# Patient Record
Sex: Female | Born: 1965
Health system: Southern US, Community
[De-identification: ages and names within clinical notes are randomized; demographics above are authoritative.]

## PROBLEM LIST (undated history)

## (undated) DIAGNOSIS — G4730 Sleep apnea, unspecified: Secondary | ICD-10-CM

## (undated) DIAGNOSIS — E079 Disorder of thyroid, unspecified: Secondary | ICD-10-CM

## (undated) DIAGNOSIS — M2550 Pain in unspecified joint: Secondary | ICD-10-CM

## (undated) DIAGNOSIS — R7989 Other specified abnormal findings of blood chemistry: Secondary | ICD-10-CM

## (undated) DIAGNOSIS — J40 Bronchitis, not specified as acute or chronic: Secondary | ICD-10-CM

## (undated) DIAGNOSIS — Z9889 Other specified postprocedural states: Secondary | ICD-10-CM

## (undated) DIAGNOSIS — E7800 Pure hypercholesterolemia, unspecified: Secondary | ICD-10-CM

## (undated) DIAGNOSIS — D219 Benign neoplasm of connective and other soft tissue, unspecified: Secondary | ICD-10-CM

## (undated) DIAGNOSIS — R112 Nausea with vomiting, unspecified: Secondary | ICD-10-CM

## (undated) DIAGNOSIS — C801 Malignant (primary) neoplasm, unspecified: Secondary | ICD-10-CM

## (undated) DIAGNOSIS — E039 Hypothyroidism, unspecified: Secondary | ICD-10-CM

## (undated) DIAGNOSIS — E611 Iron deficiency: Secondary | ICD-10-CM

## (undated) DIAGNOSIS — F419 Anxiety disorder, unspecified: Secondary | ICD-10-CM

## (undated) DIAGNOSIS — M1990 Unspecified osteoarthritis, unspecified site: Secondary | ICD-10-CM

## (undated) DIAGNOSIS — E539 Vitamin B deficiency, unspecified: Secondary | ICD-10-CM

## (undated) HISTORY — DX: Anxiety disorder, unspecified: F41.9

## (undated) HISTORY — DX: Pain in unspecified joint: M25.50

## (undated) HISTORY — DX: Iron deficiency: E61.1

## (undated) HISTORY — PX: COLONOSCOPY: SHX174

## (undated) HISTORY — DX: Bronchitis, not specified as acute or chronic: J40

## (undated) HISTORY — PX: ABDOMINAL HYSTERECTOMY: SHX81

## (undated) HISTORY — DX: Vitamin B deficiency, unspecified: E53.9

## (undated) HISTORY — DX: Unspecified osteoarthritis, unspecified site: M19.90

## (undated) HISTORY — DX: Pure hypercholesterolemia, unspecified: E78.00

## (undated) HISTORY — PX: MOHS SURGERY: SUR867

## (undated) HISTORY — DX: Disorder of thyroid, unspecified: E07.9

## (undated) HISTORY — PX: WISDOM TOOTH EXTRACTION: SHX21

## (undated) HISTORY — DX: Other specified abnormal findings of blood chemistry: R79.89

---

## 2005-11-08 ENCOUNTER — Emergency Department (HOSPITAL_COMMUNITY): Admission: EM | Admit: 2005-11-08 | Discharge: 2005-11-08 | Payer: Self-pay | Admitting: Emergency Medicine

## 2006-01-28 ENCOUNTER — Encounter: Admission: RE | Admit: 2006-01-28 | Discharge: 2006-01-28 | Payer: Self-pay | Admitting: Internal Medicine

## 2006-09-15 ENCOUNTER — Encounter: Admission: RE | Admit: 2006-09-15 | Discharge: 2006-09-15 | Payer: Self-pay | Admitting: Obstetrics & Gynecology

## 2007-09-26 ENCOUNTER — Encounter: Admission: RE | Admit: 2007-09-26 | Discharge: 2007-09-26 | Payer: Self-pay | Admitting: Obstetrics & Gynecology

## 2007-10-03 ENCOUNTER — Encounter: Admission: RE | Admit: 2007-10-03 | Discharge: 2007-10-03 | Payer: Self-pay | Admitting: Obstetrics & Gynecology

## 2008-02-09 ENCOUNTER — Ambulatory Visit: Payer: Self-pay | Admitting: Internal Medicine

## 2008-08-14 ENCOUNTER — Ambulatory Visit: Payer: Self-pay | Admitting: Internal Medicine

## 2008-10-09 ENCOUNTER — Encounter: Admission: RE | Admit: 2008-10-09 | Discharge: 2008-10-09 | Payer: Self-pay | Admitting: Obstetrics & Gynecology

## 2009-02-12 ENCOUNTER — Ambulatory Visit: Payer: Self-pay | Admitting: Internal Medicine

## 2009-08-22 ENCOUNTER — Ambulatory Visit: Payer: Self-pay | Admitting: Internal Medicine

## 2009-10-14 ENCOUNTER — Encounter: Admission: RE | Admit: 2009-10-14 | Discharge: 2009-10-14 | Payer: Self-pay | Admitting: Obstetrics & Gynecology

## 2010-02-27 ENCOUNTER — Ambulatory Visit: Payer: Self-pay | Admitting: Internal Medicine

## 2010-03-07 ENCOUNTER — Encounter: Admission: RE | Admit: 2010-03-07 | Discharge: 2010-03-07 | Payer: Self-pay | Admitting: Internal Medicine

## 2010-06-05 ENCOUNTER — Other Ambulatory Visit: Payer: Self-pay | Admitting: Internal Medicine

## 2010-06-06 ENCOUNTER — Ambulatory Visit: Payer: Self-pay | Admitting: Internal Medicine

## 2010-06-30 ENCOUNTER — Other Ambulatory Visit: Payer: Federal, State, Local not specified - PPO | Admitting: Internal Medicine

## 2010-07-08 ENCOUNTER — Ambulatory Visit (INDEPENDENT_AMBULATORY_CARE_PROVIDER_SITE_OTHER): Payer: Federal, State, Local not specified - PPO | Admitting: Internal Medicine

## 2010-07-08 DIAGNOSIS — E785 Hyperlipidemia, unspecified: Secondary | ICD-10-CM

## 2010-08-04 ENCOUNTER — Encounter: Payer: Self-pay | Admitting: Internal Medicine

## 2010-08-28 ENCOUNTER — Ambulatory Visit: Payer: Federal, State, Local not specified - PPO | Admitting: Internal Medicine

## 2010-09-20 ENCOUNTER — Other Ambulatory Visit: Payer: Self-pay | Admitting: Internal Medicine

## 2010-10-07 ENCOUNTER — Other Ambulatory Visit: Payer: Federal, State, Local not specified - PPO | Admitting: Internal Medicine

## 2010-10-07 ENCOUNTER — Telehealth: Payer: Self-pay | Admitting: *Deleted

## 2010-10-07 DIAGNOSIS — E039 Hypothyroidism, unspecified: Secondary | ICD-10-CM

## 2010-10-07 DIAGNOSIS — E785 Hyperlipidemia, unspecified: Secondary | ICD-10-CM

## 2010-10-07 LAB — LIPID PANEL
HDL: 51 mg/dL (ref >39)
LDL Cholesterol: 69 mg/dL (ref 0–99)
Total CHOL/HDL Ratio: 2.6 Ratio
Triglycerides: 68 mg/dL (ref <150)
VLDL: 14 mg/dL (ref 0–40)

## 2010-10-07 LAB — HEPATIC FUNCTION PANEL
Albumin: 4.4 g/dL (ref 3.5–5.2)
Alkaline Phosphatase: 63 U/L (ref 39–117)
Total Bilirubin: 0.6 mg/dL (ref 0.3–1.2)
Total Protein: 7 g/dL (ref 6.0–8.3)

## 2010-10-07 LAB — HEMOGLOBIN A1C
Hgb A1c MFr Bld: 5.3 % (ref <5.7)
Mean Plasma Glucose: 105 mg/dL (ref <117)

## 2010-10-07 LAB — TSH: TSH: 2.218 u[IU]/mL (ref 0.350–4.500)

## 2010-10-07 NOTE — Progress Notes (Signed)
Addended by: Daisy Blossom on: 10/07/2010 09:23 AM   Modules accepted: Orders

## 2010-10-07 NOTE — Progress Notes (Signed)
Addended by: Daisy Blossom on: 10/07/2010 11:01 AM   Modules accepted: Orders

## 2010-10-07 NOTE — Telephone Encounter (Signed)
Order free T4, TSH and AIC.

## 2010-10-08 LAB — FERRITIN: Ferritin: 63 ng/mL (ref 10–291)

## 2010-10-09 ENCOUNTER — Encounter: Payer: Self-pay | Admitting: Internal Medicine

## 2010-10-09 ENCOUNTER — Ambulatory Visit (INDEPENDENT_AMBULATORY_CARE_PROVIDER_SITE_OTHER): Payer: Federal, State, Local not specified - PPO | Admitting: Internal Medicine

## 2010-10-09 DIAGNOSIS — E039 Hypothyroidism, unspecified: Secondary | ICD-10-CM

## 2010-10-09 DIAGNOSIS — F411 Generalized anxiety disorder: Secondary | ICD-10-CM

## 2010-10-09 DIAGNOSIS — F419 Anxiety disorder, unspecified: Secondary | ICD-10-CM | POA: Insufficient documentation

## 2010-10-09 DIAGNOSIS — G43909 Migraine, unspecified, not intractable, without status migrainosus: Secondary | ICD-10-CM

## 2010-10-09 DIAGNOSIS — E785 Hyperlipidemia, unspecified: Secondary | ICD-10-CM

## 2010-10-09 NOTE — Patient Instructions (Signed)
Continue nutritional counseling. Exercise and weight loss. See in 6 months for PE

## 2010-10-09 NOTE — Progress Notes (Signed)
  Subjective:    Patient ID: Tricia Miranda, female    DOB: 05-18-1965, 45 y.o.   MRN: [id number]  HPI in today to followup on hypothyroidism and hyperlipidemia. Has been working with nutritionist. Nutritionist wanted diabetes ruled out and hemoglobin A1c proved to be within normal limits. Patient says she's lost about 5 or 10 panels since last visit. Last visit March 2000 coil she weight 207 1/2 pounds so indeed she's lost 11 1/2 pounds. This is good progress. She will to come off of Crestor if she loses sufficient amount of weight. Told her she would need to lose 20-25 pounds before we would consider that. For now continue with signed dose of Synthroid and Crestor 5 mg daily.    Review of Systems     Objective:   Physical Exam chest clear cardiac exam regular rate and rhythm thyroid without enlargement        Assessment & Plan:  Hypothyroidism-stable on current dose of Synthroid  Hyperlipidemia-stable on Crestor 5 mg daily  Anxiety stable on current anti-anxiety medication  Obesity successful weight loss with nutritional intervention  Migraine-no increase in number of migraines  Plan return in 6 months for physical examination and fasting lab work

## 2010-10-10 ENCOUNTER — Other Ambulatory Visit: Payer: Self-pay | Admitting: Obstetrics & Gynecology

## 2010-10-10 DIAGNOSIS — Z1231 Encounter for screening mammogram for malignant neoplasm of breast: Secondary | ICD-10-CM

## 2010-10-29 ENCOUNTER — Ambulatory Visit
Admission: RE | Admit: 2010-10-29 | Discharge: 2010-10-29 | Disposition: A | Discharge status: Home / Self Care | Payer: Federal, State, Local not specified - PPO | Source: Ambulatory Visit | Attending: Obstetrics & Gynecology | Admitting: Obstetrics & Gynecology

## 2010-10-29 DIAGNOSIS — Z1231 Encounter for screening mammogram for malignant neoplasm of breast: Secondary | ICD-10-CM

## 2010-11-26 ENCOUNTER — Other Ambulatory Visit: Payer: Self-pay

## 2010-11-26 MED ORDER — ROSUVASTATIN CALCIUM 5 MG PO TABS: 5.0000 mg | ORAL_TABLET | Freq: Every day | ORAL | Status: DC

## 2010-11-27 ENCOUNTER — Telehealth: Payer: Self-pay | Admitting: Internal Medicine

## 2010-11-27 NOTE — Telephone Encounter (Signed)
Left message for patient to call and schedule CPE w/no pap after 02/28/11.

## 2011-02-26 ENCOUNTER — Other Ambulatory Visit: Payer: Self-pay | Admitting: Internal Medicine

## 2011-02-26 MED ORDER — ALPRAZOLAM 0.25 MG PO TABS: 0.2500 mg | ORAL_TABLET | Freq: Two times a day (BID) | ORAL | Status: DC | PRN

## 2011-03-10 ENCOUNTER — Encounter: Payer: Self-pay | Admitting: Internal Medicine

## 2011-03-10 ENCOUNTER — Ambulatory Visit (INDEPENDENT_AMBULATORY_CARE_PROVIDER_SITE_OTHER): Payer: Federal, State, Local not specified - PPO | Admitting: Internal Medicine

## 2011-03-10 VITALS — BP 120/72 | HR 76 | Temp 98.6°F | Wt 197.0 lb

## 2011-03-10 DIAGNOSIS — M546 Pain in thoracic spine: Secondary | ICD-10-CM

## 2011-03-10 NOTE — Patient Instructions (Signed)
Take ibuprofen 400 mg 3 times daily for 10 days to 2 weeks. Ice back down for 20 minutes daily. Take Flexeril 1/2-1 tablet at bedtime. Call if not better in 2 weeks and we will arrange physical therapy. Do not think x-ray is indicated at this point in time.

## 2011-03-10 NOTE — Progress Notes (Signed)
  Subjective:    Patient ID: Tricia Miranda, female    DOB: [birth year], 45 y.o.   MRN: [id number]  HPI Patient recalls caring a pan of brownies a block and a half to another home program October 31. After that she began to experience some right parathoracic back pain. Recently has been swimming using kick board with her arms extended. Has felt that activity was aggravating the parathoracic back pain as well. Has had some nausea with this pain. Tylenol does not help the pain. She is concerned because the pain has gone on  for at least a couple of weeks. No neck pain and no lower back pain. No fever or chills. No vomiting. Just occasional nausea. No headache.    Review of Systems     Objective:   Physical Exam could not specifically identify any trigger points  in parathoracic muscle areas. No pain to palpation of the thoracic spine. Deep tendon reflexes 2+ and symmetrical. Patient indicates area of pain is mid right parathoracic spine area. She indicated during the exam that when she dorsiflexed her neck she felt pain in that area. Muscle strength in the upper 70s is within normal limits.        Assessment & Plan:   musculoskeletal parathoracic back pain.  Plan: Patient was given prescription for Flexeril 10 mg per and sees #30) 1/2-1 by mouth each bedtime. Take ibuprofen 400 mg by mouth 3 times a day with food for 10 days to 2 weeks. Ice back down for 20 minutes daily. Avoid swimming using arms extended with kick board. She has been swimming about 30 minutes daily during lunch.

## 2011-03-19 ENCOUNTER — Other Ambulatory Visit: Payer: Federal, State, Local not specified - PPO | Admitting: Internal Medicine

## 2011-03-19 DIAGNOSIS — E785 Hyperlipidemia, unspecified: Secondary | ICD-10-CM

## 2011-03-19 DIAGNOSIS — E039 Hypothyroidism, unspecified: Secondary | ICD-10-CM

## 2011-03-19 DIAGNOSIS — Z0000 Encounter for general adult medical examination without abnormal findings: Secondary | ICD-10-CM

## 2011-03-20 LAB — COMPREHENSIVE METABOLIC PANEL
ALT: 14 U/L (ref 0–35)
Alkaline Phosphatase: 65 U/L (ref 39–117)
CO2: 25 mEq/L (ref 19–32)
Creat: 0.66 mg/dL (ref 0.50–1.10)
Glucose, Bld: 96 mg/dL (ref 70–99)
Sodium: 138 mEq/L (ref 135–145)
Total Bilirubin: 0.5 mg/dL (ref 0.3–1.2)
Total Protein: 6.6 g/dL (ref 6.0–8.3)

## 2011-03-20 LAB — CBC WITH DIFFERENTIAL/PLATELET
Basophils Relative: 1 % (ref 0–1)
Eosinophils Absolute: 0.1 10*3/uL (ref 0.0–0.7)
Eosinophils Relative: 2 % (ref 0–5)
Lymphs Abs: 2.2 10*3/uL (ref 0.7–4.0)
MCH: 29.3 pg (ref 26.0–34.0)
MCHC: 32.7 g/dL (ref 30.0–36.0)
MCV: 89.6 fL (ref 78.0–100.0)
Neutrophils Relative %: 53 % (ref 43–77)
Platelets: 280 10*3/uL (ref 150–400)
RBC: 4.51 MIL/uL (ref 3.87–5.11)
RDW: 13 % (ref 11.5–15.5)

## 2011-03-20 LAB — LIPID PANEL
Cholesterol: 152 mg/dL (ref 0–200)
LDL Cholesterol: 82 mg/dL (ref 0–99)
Total CHOL/HDL Ratio: 2.9 Ratio
Triglycerides: 84 mg/dL (ref <150)
VLDL: 17 mg/dL (ref 0–40)

## 2011-03-23 ENCOUNTER — Ambulatory Visit (INDEPENDENT_AMBULATORY_CARE_PROVIDER_SITE_OTHER): Payer: Federal, State, Local not specified - PPO | Admitting: Internal Medicine

## 2011-03-23 ENCOUNTER — Encounter: Payer: Self-pay | Admitting: Internal Medicine

## 2011-03-23 VITALS — BP 106/66 | HR 76 | Temp 99.1°F | Ht 67.5 in | Wt 197.0 lb

## 2011-03-23 DIAGNOSIS — IMO0001 Reserved for inherently not codable concepts without codable children: Secondary | ICD-10-CM

## 2011-03-23 DIAGNOSIS — M7918 Myalgia, other site: Secondary | ICD-10-CM

## 2011-03-23 DIAGNOSIS — Z0000 Encounter for general adult medical examination without abnormal findings: Secondary | ICD-10-CM

## 2011-03-23 DIAGNOSIS — F419 Anxiety disorder, unspecified: Secondary | ICD-10-CM

## 2011-03-23 DIAGNOSIS — E785 Hyperlipidemia, unspecified: Secondary | ICD-10-CM

## 2011-03-23 DIAGNOSIS — F411 Generalized anxiety disorder: Secondary | ICD-10-CM

## 2011-03-23 DIAGNOSIS — E039 Hypothyroidism, unspecified: Secondary | ICD-10-CM

## 2011-03-23 DIAGNOSIS — F4521 Hypochondriasis: Secondary | ICD-10-CM

## 2011-03-23 LAB — POCT URINALYSIS DIPSTICK
Bilirubin, UA: NEGATIVE
Ketones, UA: NEGATIVE
Protein, UA: NEGATIVE
pH, UA: 7

## 2011-04-20 NOTE — Patient Instructions (Signed)
Continue same medications and return in 6 months. Please do not worry excessively about minor fleeting symptoms.

## 2011-04-29 ENCOUNTER — Other Ambulatory Visit: Payer: Self-pay

## 2011-04-29 MED ORDER — ROSUVASTATIN CALCIUM 5 MG PO TABS: 5.0000 mg | ORAL_TABLET | Freq: Every day | ORAL | Status: DC

## 2011-04-30 ENCOUNTER — Other Ambulatory Visit: Payer: Federal, State, Local not specified - PPO | Admitting: Internal Medicine

## 2011-06-02 ENCOUNTER — Other Ambulatory Visit: Payer: Self-pay | Admitting: Internal Medicine

## 2011-06-02 DIAGNOSIS — Z8781 Personal history of (healed) traumatic fracture: Secondary | ICD-10-CM

## 2011-06-08 ENCOUNTER — Ambulatory Visit
Admission: RE | Admit: 2011-06-08 | Discharge: 2011-06-08 | Disposition: A | Discharge status: Home / Self Care | Payer: Federal, State, Local not specified - PPO | Source: Ambulatory Visit | Attending: Internal Medicine | Admitting: Internal Medicine

## 2011-06-08 DIAGNOSIS — Z8781 Personal history of (healed) traumatic fracture: Secondary | ICD-10-CM

## 2011-06-18 ENCOUNTER — Encounter: Payer: Self-pay | Admitting: Internal Medicine

## 2011-06-18 NOTE — Progress Notes (Signed)
Subjective:    Patient ID: Tricia Miranda, female    DOB: January 29, 1966, 46 y.o.   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 History of hyperlipidemia, hypothyroidism, anxiety, fibrocystic breast disease, essential tremor, migraine headache, vitamin D deficiency, basal cell carcinoma left face 2009. Had right comminuted humerus fracture October 2007. Left fibula fracture March 2009. Last mammogram June 2011. Tetanus immunization 2007.  Patient is on Xanax twice daily when necessary, generic Prozac daily, Crestor. Thought she may have had some myalgias from Zocor. Was started on Crestor March 2012. Recently has had some musculoskeletal back pain for which Flexeril was prescribed.  Family history father with history of alcoholism and heart disease. Mother in good health. Sr. treated for hip dysplasia.  Social history patient's husband as a Social worker  At [employer]. Patient works for [employer]. She and her husband moved here from [place] for him to take job in [employer] at [employer]. Patient does not smoke. Social alcohol consumption consisting of one or 2 drinks per day 3 times a week. Admits to being obsessive and anxious about her health. Has multiple questions about various aches and pains. Occasionally has some paresthesias. At one point she brought in a 3 page long outlining these in 2008. Apparently when she was living in [place] a few years ago she had a fine tremor and some falls while she was on a hiking trip. Western was raised about multiple sclerosis. She actually had 2 MRIs but only have one on file in 2002 with and without gadolinium showing a punctate focus of T2 hypersensitivity in the superior left frontal white matter described as nonspecific. Was also a tiny focus of enhancement in the immediate pons probably representing a capillary T. late dictation. She was evaluated by a  neurologist at [facility] and subsequently she had a psychologist who insisted it was important to obtain a diagnosis if indeed she did have multiple sclerosis so she went to see someone at the [facility] and had more testing. Somewhere along the way she had nerve conduction testing and thinks it may have revealed some "interesting" abnormality but I do not have a copy of that report. In any case, the question of multiple sclerosis I think remains in her mind at times.    Review of Systems  Constitutional: Positive for fatigue.  HENT: Negative.   Eyes: Negative.   Respiratory: Negative.   Cardiovascular: Negative.   Gastrointestinal: Negative.   Genitourinary: Negative.   Musculoskeletal:       Parascapular pain  Neurological:       Essential tremor  Hematological: Negative.   Psychiatric/Behavioral:       Anxiety       Objective:   Physical Exam  Vitals reviewed. Constitutional: She is oriented to person, place, and time. She appears well-developed and well-nourished. No distress.  HENT:  Head: Normocephalic and atraumatic.  Right Ear: External ear normal.  Left Ear: External ear normal.  Mouth/Throat: Oropharynx is clear and moist.  Eyes: Conjunctivae and EOM are normal. Pupils are equal, round, and reactive to light. Right eye exhibits no discharge. Left eye exhibits no discharge.  Neck: Neck supple. No JVD present. No thyromegaly present.  Cardiovascular: Normal rate, regular rhythm, normal heart sounds and intact distal pulses.   No murmur heard. Pulmonary/Chest: She has no wheezes. She has no rales.  Abdominal: Soft. Bowel sounds are  normal. She exhibits no mass. There is no tenderness. There is no rebound.  Genitourinary:       Deferred to GYN  Musculoskeletal: Normal range of motion.  Lymphadenopathy:    She has no cervical adenopathy.  Neurological: She is alert and oriented to person, place, and time. She has normal reflexes. She  displays normal reflexes. No cranial nerve deficit. Coordination normal.       Essential tremor  Skin: Skin is warm and dry. No rash noted. She is not diaphoretic.  Psychiatric: She has a normal mood and affect. Judgment normal.          Assessment & Plan:  Hypothyroidism  Anxiety  Essential tremor  History of migraine headaches  History of vitamin D deficiency  History of basal cell carcinoma left face  Musculoskeletal pain parascapular area  Remote history of paresthesias raising the question of possible multiple sclerosis but this was never proven but remains a cause for patient's anxiety.  Plan: Patient is to return in 6 months for TSH, fasting lipid panel liver functions and office visit. Okay to refill antianxiety medication for 6 months. Continue Prozac, Crestor, over-the-counter vitamin D supplementation and Synthroid

## 2011-06-29 ENCOUNTER — Other Ambulatory Visit: Payer: Self-pay | Admitting: Internal Medicine

## 2011-06-30 ENCOUNTER — Other Ambulatory Visit: Payer: Self-pay

## 2011-06-30 MED ORDER — SYNTHROID 75 MCG PO TABS: 75.0000 ug | ORAL_TABLET | Freq: Every day | ORAL | Status: DC

## 2011-09-21 ENCOUNTER — Other Ambulatory Visit: Payer: Self-pay | Admitting: Internal Medicine

## 2011-09-22 ENCOUNTER — Ambulatory Visit: Payer: Self-pay | Admitting: Internal Medicine

## 2011-09-22 ENCOUNTER — Other Ambulatory Visit: Payer: Federal, State, Local not specified - PPO | Admitting: Internal Medicine

## 2011-09-24 ENCOUNTER — Ambulatory Visit: Payer: Federal, State, Local not specified - PPO | Admitting: Internal Medicine

## 2011-10-19 ENCOUNTER — Other Ambulatory Visit: Payer: Self-pay | Admitting: Internal Medicine

## 2011-10-26 ENCOUNTER — Other Ambulatory Visit: Payer: Federal, State, Local not specified - PPO | Admitting: Internal Medicine

## 2011-10-26 DIAGNOSIS — E785 Hyperlipidemia, unspecified: Secondary | ICD-10-CM

## 2011-10-26 DIAGNOSIS — E039 Hypothyroidism, unspecified: Secondary | ICD-10-CM

## 2011-10-26 DIAGNOSIS — Z7901 Long term (current) use of anticoagulants: Secondary | ICD-10-CM

## 2011-10-26 LAB — LIPID PANEL
Cholesterol: 163 mg/dL (ref 0–200)
Total CHOL/HDL Ratio: 3.1 Ratio
Triglycerides: 128 mg/dL (ref <150)
VLDL: 26 mg/dL (ref 0–40)

## 2011-10-26 LAB — HEPATIC FUNCTION PANEL
ALT: 14 U/L (ref 0–35)
AST: 15 U/L (ref 0–37)
Bilirubin, Direct: 0.1 mg/dL (ref 0.0–0.3)
Total Protein: 6.7 g/dL (ref 6.0–8.3)

## 2011-10-26 LAB — TSH: TSH: 4.939 u[IU]/mL — ABNORMAL HIGH (ref 0.350–4.500)

## 2011-10-27 ENCOUNTER — Ambulatory Visit (INDEPENDENT_AMBULATORY_CARE_PROVIDER_SITE_OTHER): Payer: Federal, State, Local not specified - PPO | Admitting: Internal Medicine

## 2011-10-27 ENCOUNTER — Encounter: Payer: Self-pay | Admitting: Internal Medicine

## 2011-10-27 VITALS — BP 106/74 | HR 72 | Temp 98.8°F | Ht 67.25 in | Wt 203.0 lb

## 2011-10-27 DIAGNOSIS — E039 Hypothyroidism, unspecified: Secondary | ICD-10-CM

## 2011-10-27 DIAGNOSIS — Z8639 Personal history of other endocrine, nutritional and metabolic disease: Secondary | ICD-10-CM

## 2011-10-27 DIAGNOSIS — Z87898 Personal history of other specified conditions: Secondary | ICD-10-CM

## 2011-10-27 DIAGNOSIS — E785 Hyperlipidemia, unspecified: Secondary | ICD-10-CM

## 2011-10-27 DIAGNOSIS — F411 Generalized anxiety disorder: Secondary | ICD-10-CM

## 2011-10-27 DIAGNOSIS — Z8669 Personal history of other diseases of the nervous system and sense organs: Secondary | ICD-10-CM

## 2011-10-27 DIAGNOSIS — G250 Essential tremor: Secondary | ICD-10-CM

## 2011-10-27 DIAGNOSIS — F419 Anxiety disorder, unspecified: Secondary | ICD-10-CM

## 2011-10-27 NOTE — Patient Instructions (Addendum)
Increase Synthroid to 0.1mg  and recheck TSH in 3 months. Continue Crestor at same dose. CPE due in 6 months.

## 2011-11-14 DIAGNOSIS — Z8639 Personal history of other endocrine, nutritional and metabolic disease: Secondary | ICD-10-CM | POA: Insufficient documentation

## 2011-11-14 DIAGNOSIS — G250 Essential tremor: Secondary | ICD-10-CM | POA: Insufficient documentation

## 2011-11-14 NOTE — Progress Notes (Signed)
  Subjective:    Patient ID: Tricia Miranda, female    DOB: Apr 13, 1966, 46 y.o.   MRN: [id number]  HPI 46 year old white female with history of anxiety, hyperlipidemia, hypothyroidism and migraine headaches in today for six-month recheck. Has been on Xanax and Prozac for a long time. Is now on Crestor for hyperlipidemia. She felt Zocor may have caused some myalgias. History of fibrocystic breast disease, essential tremor, vitamin D deficiency, female pattern baldness, basal cell carcinoma of left face. His been on Crestor since March 2012 at a dose of 5 mg daily.    Review of Systems     Objective:   Physical Exam neck supple without thyromegaly, skin pale warm and dry without significant lesions, chest clear to auscultation; cardiac exam regular rate and rhythm; extremities without edema; mood is stable and upbeat. Not apparently anxious today. Very fine essential tremor        Assessment & Plan:  Hyperlipidemia  Hypothyroidism  Mild essential tremor  Migraine headaches  Anxiety  Plan: Return in 6 months for physical exam. Continue same medications. For some reason TSH is elevated at 4.9. Reviewed with her taking medication on it the stomach. She's been on Synthroid 0. 075 mg daily for some time. We can increase it to 0.1 mg daily and recheck in 3 months.

## 2011-11-18 ENCOUNTER — Other Ambulatory Visit: Payer: Self-pay | Admitting: Obstetrics & Gynecology

## 2011-11-18 DIAGNOSIS — Z1231 Encounter for screening mammogram for malignant neoplasm of breast: Secondary | ICD-10-CM

## 2011-12-07 ENCOUNTER — Ambulatory Visit
Admission: RE | Admit: 2011-12-07 | Discharge: 2011-12-07 | Disposition: A | Discharge status: Home / Self Care | Payer: Federal, State, Local not specified - PPO | Source: Ambulatory Visit | Attending: Obstetrics & Gynecology | Admitting: Obstetrics & Gynecology

## 2011-12-07 DIAGNOSIS — Z1231 Encounter for screening mammogram for malignant neoplasm of breast: Secondary | ICD-10-CM

## 2012-01-26 ENCOUNTER — Other Ambulatory Visit: Payer: Federal, State, Local not specified - PPO | Admitting: Internal Medicine

## 2012-01-26 DIAGNOSIS — E039 Hypothyroidism, unspecified: Secondary | ICD-10-CM

## 2012-02-24 ENCOUNTER — Other Ambulatory Visit: Payer: Self-pay | Admitting: Internal Medicine

## 2012-02-29 ENCOUNTER — Ambulatory Visit: Payer: Federal, State, Local not specified - PPO | Admitting: Internal Medicine

## 2012-03-01 ENCOUNTER — Encounter: Payer: Self-pay | Admitting: Internal Medicine

## 2012-03-01 ENCOUNTER — Ambulatory Visit (INDEPENDENT_AMBULATORY_CARE_PROVIDER_SITE_OTHER): Payer: Federal, State, Local not specified - PPO | Admitting: Internal Medicine

## 2012-03-01 VITALS — BP 106/72 | HR 88 | Temp 101.7°F | Wt 198.0 lb

## 2012-03-01 DIAGNOSIS — H6690 Otitis media, unspecified, unspecified ear: Secondary | ICD-10-CM

## 2012-03-01 DIAGNOSIS — H109 Unspecified conjunctivitis: Secondary | ICD-10-CM

## 2012-03-01 DIAGNOSIS — R509 Fever, unspecified: Secondary | ICD-10-CM

## 2012-03-01 DIAGNOSIS — H6693 Otitis media, unspecified, bilateral: Secondary | ICD-10-CM

## 2012-03-01 DIAGNOSIS — J029 Acute pharyngitis, unspecified: Secondary | ICD-10-CM

## 2012-03-01 NOTE — Patient Instructions (Addendum)
Take Zithromax Z-PAK as corrected. Call if not better in one week. Stay out of work if you have fever. Use eyedrops in each eye 4 times daily for 5 days

## 2012-03-01 NOTE — Progress Notes (Signed)
  Subjective:    Patient ID: Tricia Miranda, female    DOB: 26-Jan-1966, 46 y.o.   MRN: [id number]  HPI 46 year old white female with URI symptoms, fever, malaise, ear pain, sore throat. Has postnasal drip. Some cough. No shaking chills. Her son was ill around October 31 but got well within 5 days. He had a strep test and flu test both of which were negative. HEENT exam: Both TMs are full and pain bilaterally. Pharynx is red without exudate. Rapid strep screen is negative. Neck is supple without significant adenopathy. Chest is clear to auscultation without rales    Review of Systems     Objective:   Physical Exam HEENT exam: Both TMs are full and pink bilaterally. Pharynx is red without exudate. Rapid strep screen is negative. Neck is supple without significant adenopathy. Chest is clear to auscultation without rales. Conjunctivae injected bilaterally.       Assessment & Plan:  Bilateral otitis media  Pharyngitis  Conjunctivitis  Plan: Zithromax Z-Pak take 2 tablets by mouth day one followed by 1 tablet by mouth days 2 through 5. Call if not better in one week or sooner if worse. Ofloxacin ophthalmic drops 2 drops in each eye 4 times a day for 5 days

## 2012-03-27 ENCOUNTER — Other Ambulatory Visit: Payer: Self-pay | Admitting: Internal Medicine

## 2012-04-04 ENCOUNTER — Other Ambulatory Visit: Payer: Self-pay

## 2012-04-04 MED ORDER — ALPRAZOLAM 0.25 MG PO TABS: 0.2500 mg | ORAL_TABLET | Freq: Two times a day (BID) | ORAL | Status: DC | PRN

## 2012-04-05 ENCOUNTER — Other Ambulatory Visit: Payer: Self-pay

## 2012-04-05 MED ORDER — ALPRAZOLAM 0.25 MG PO TABS: 0.2500 mg | ORAL_TABLET | Freq: Two times a day (BID) | ORAL | Status: DC | PRN

## 2012-05-06 ENCOUNTER — Other Ambulatory Visit: Payer: Federal, State, Local not specified - PPO | Admitting: Internal Medicine

## 2012-05-10 ENCOUNTER — Encounter: Payer: Federal, State, Local not specified - PPO | Admitting: Internal Medicine

## 2012-06-20 ENCOUNTER — Other Ambulatory Visit: Payer: PRIVATE HEALTH INSURANCE | Admitting: Internal Medicine

## 2012-06-20 ENCOUNTER — Other Ambulatory Visit: Payer: Self-pay | Admitting: Internal Medicine

## 2012-06-20 DIAGNOSIS — Z0000 Encounter for general adult medical examination without abnormal findings: Secondary | ICD-10-CM

## 2012-06-20 DIAGNOSIS — E785 Hyperlipidemia, unspecified: Secondary | ICD-10-CM

## 2012-06-20 LAB — CBC WITH DIFFERENTIAL/PLATELET
Lymphocytes Relative: 29 % (ref 12–46)
Lymphs Abs: 2 10*3/uL (ref 0.7–4.0)
MCV: 83.3 fL (ref 78.0–100.0)
Neutrophils Relative %: 62 % (ref 43–77)
Platelets: 269 10*3/uL (ref 150–400)
RBC: 4.79 MIL/uL (ref 3.87–5.11)
WBC: 6.8 10*3/uL (ref 4.0–10.5)

## 2012-06-20 LAB — COMPREHENSIVE METABOLIC PANEL
ALT: 11 U/L (ref 0–35)
CO2: 23 mEq/L (ref 19–32)
Calcium: 9.1 mg/dL (ref 8.4–10.5)
Chloride: 103 mEq/L (ref 96–112)
Potassium: 4.3 mEq/L (ref 3.5–5.3)
Sodium: 137 mEq/L (ref 135–145)
Total Protein: 6.8 g/dL (ref 6.0–8.3)

## 2012-06-20 LAB — LIPID PANEL: Cholesterol: 157 mg/dL (ref 0–200)

## 2012-06-21 LAB — VITAMIN D 25 HYDROXY (VIT D DEFICIENCY, FRACTURES): Vit D, 25-Hydroxy: 41 ng/mL (ref 30–89)

## 2012-06-23 ENCOUNTER — Ambulatory Visit (INDEPENDENT_AMBULATORY_CARE_PROVIDER_SITE_OTHER): Payer: PRIVATE HEALTH INSURANCE | Admitting: Internal Medicine

## 2012-06-23 ENCOUNTER — Encounter: Payer: Self-pay | Admitting: Internal Medicine

## 2012-06-23 VITALS — BP 106/70 | HR 80 | Temp 98.5°F | Ht 67.25 in | Wt 200.0 lb

## 2012-06-23 DIAGNOSIS — K219 Gastro-esophageal reflux disease without esophagitis: Secondary | ICD-10-CM

## 2012-06-23 DIAGNOSIS — R197 Diarrhea, unspecified: Secondary | ICD-10-CM

## 2012-06-23 DIAGNOSIS — E039 Hypothyroidism, unspecified: Secondary | ICD-10-CM

## 2012-06-23 DIAGNOSIS — K2990 Gastroduodenitis, unspecified, without bleeding: Secondary | ICD-10-CM

## 2012-06-23 DIAGNOSIS — Z0000 Encounter for general adult medical examination without abnormal findings: Secondary | ICD-10-CM

## 2012-06-23 DIAGNOSIS — E785 Hyperlipidemia, unspecified: Secondary | ICD-10-CM

## 2012-06-23 DIAGNOSIS — K2970 Gastritis, unspecified, without bleeding: Secondary | ICD-10-CM

## 2012-06-23 DIAGNOSIS — F411 Generalized anxiety disorder: Secondary | ICD-10-CM

## 2012-06-23 LAB — POCT URINALYSIS DIPSTICK
Bilirubin, UA: NEGATIVE
Glucose, UA: NEGATIVE
Leukocytes, UA: NEGATIVE
Nitrite, UA: NEGATIVE
Urobilinogen, UA: NEGATIVE
pH, UA: 7

## 2012-06-23 MED ORDER — ALPRAZOLAM 0.25 MG PO TABS: 0.2500 mg | ORAL_TABLET | Freq: Two times a day (BID) | ORAL | Status: DC | PRN

## 2012-06-23 MED ORDER — ROSUVASTATIN CALCIUM 5 MG PO TABS: ORAL_TABLET | ORAL | Status: DC

## 2012-06-23 MED ORDER — FLUOXETINE HCL 10 MG PO CAPS: 10.0000 mg | ORAL_CAPSULE | Freq: Every day | ORAL | Status: DC

## 2012-06-23 MED ORDER — SYNTHROID 100 MCG PO TABS: ORAL_TABLET | ORAL | Status: DC

## 2012-06-23 NOTE — Patient Instructions (Addendum)
Have refilled Xanax. Restart Prozac. We will make gastroenterology appt for you. Return in 6 months. Have added H.pylori, and antigliadin antibodies. Iron level will be checked.

## 2012-06-24 ENCOUNTER — Encounter: Payer: Self-pay | Admitting: Internal Medicine

## 2012-06-27 LAB — IRON AND TIBC
TIBC: 356 ug/dL (ref 250–470)
UIBC: 270 ug/dL (ref 125–400)

## 2012-06-27 NOTE — Progress Notes (Signed)
Subjective:    Patient ID: Tricia Miranda, female    DOB: [birth year], 47 y.o.   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 History of hypothyroidism, anxiety, fibrocystic breast disease, essential tremor, migraine headache, vitamin D deficiency, basal cell carcinoma left face 2009.  Had right comminuted humerus fracture October 2007. Left fibula fracture March 2009. Tetanus immunization 2007.  Patient stopped Prozac last year. Anxiety has returned. She has a good deal of obsessive thinking about her health and worried. She is on Crestor because she thought she had some myalgias from Zocor. Hasn't been taking Xanax much for anxiety.  Social history: Patient's husband as a Counsellor at [employer]. Patient works for the [employer]. She and her husband moved here from [place] for him to take a job at [employer]. One son.  Apparently while she was living in [place] a few years ago, she had a fine tremor with some falls while on a hiking trip. A question was raised about multiple sclerosis. She actually had to MRI studies but only have one on file in 2002 with and without gadolinium showing a punctate focus of T2 hypersensitivity in the superior left frontal white matter described as nonspecific. There was also a tiny focus of enhancement in the immediate pons probably representing capillary dilatation. She was evaluated by a neurologist at [facility]. Subsequently went to see someone at [facility] and had more testing. Somewhere along the way had nerve conduction testing. Thinks it may have reveals something "interesting "but I do not have a copy of that report. In any case, I think the question of multiple sclerosis remains in her mind.  Recently has had considerable diarrhea. Not related to any travel. Optically related to any foods. No milk intolerance  that she is aware of. Wants to start a gluten-free diet. Think she may have celiac disease. Has always had issues with diarrhea which sound like irritable bowel syndrome but seem to be worse lately. Has been off Xanax however. Has also been off Prozac.    Review of Systems  Constitutional: Positive for fatigue.  HENT: Negative.   Eyes: Negative.   Respiratory: Negative.   Cardiovascular: Negative.   Gastrointestinal:       More diarrhea recently. No bloody bowel movements. No rectal bleeding.  Endocrine:       History of hypothyroidism  Neurological:       History of essential tremor  Psychiatric/Behavioral:       History of anxiety and obsessive worrying       Objective:   Physical Exam  Vitals reviewed. Constitutional: She is oriented to person, place, and time. She appears well-developed and well-nourished.  HENT:  Head: Normocephalic and atraumatic.  Right Ear: External ear normal.  Left Ear: External ear normal.  Mouth/Throat: Oropharynx is clear and moist. No oropharyngeal exudate.  Eyes: Conjunctivae and EOM are normal. Pupils are equal, round, and reactive to light. Left eye exhibits no discharge. No scleral icterus.  Neck: Neck supple. No JVD present. No tracheal deviation present. No thyromegaly present.  Cardiovascular: Normal rate, regular rhythm, normal heart sounds and intact distal pulses.   No murmur heard. Pulmonary/Chest: Effort normal and breath sounds normal. She has no wheezes. She has no rales. She exhibits no tenderness.  Abdominal: Soft. Bowel sounds are normal. She exhibits no distension and no mass. There is no tenderness.  There is no rebound and no guarding.  Genitourinary:  Deferred to GYN  Musculoskeletal: Normal range of motion.  Lymphadenopathy:    She has no cervical adenopathy.  Neurological: She is alert and oriented to person, place, and time. She has normal reflexes. No cranial nerve deficit.  Essential tremor  Skin: Skin is warm and dry.  No rash noted. She is not diaphoretic.  Psychiatric: She has a normal mood and affect. Her behavior is normal. Judgment and thought content normal.  Slightly anxious          Assessment & Plan:  History of essential tremor-unchanged  Anxiety. Restart Prozac and Xanax.  History of hypothyroidism-TSH within normal limits on current dose of Synthroid.  History of vitamin D deficiency-vitamin D level female within normal limits  Diarrhea-recently increased bouts of diarrhea possibly related to stress or irritable bowel syndrome. Patient thinks she may have celiac sprue. Refer to GI for evaluation.  Plan: Refill Prozac and Xanax and followup in 6 months. Suggested counseling for anxiety disorder.

## 2012-06-28 LAB — GLIA (IGA/G) + TTG IGA: Gliadin IgA: 3.8 U/mL (ref <20)

## 2012-06-29 ENCOUNTER — Telehealth: Payer: Self-pay

## 2012-06-29 NOTE — Telephone Encounter (Signed)
Patient scheduled for an appointment with Dr. Elnoria Howard on 07/06/2012 at 8:45 am. Patient informed

## 2012-08-15 ENCOUNTER — Other Ambulatory Visit: Payer: Self-pay | Admitting: Internal Medicine

## 2012-11-01 ENCOUNTER — Other Ambulatory Visit: Payer: Self-pay

## 2012-11-01 DIAGNOSIS — Z1231 Encounter for screening mammogram for malignant neoplasm of breast: Secondary | ICD-10-CM

## 2012-11-03 ENCOUNTER — Other Ambulatory Visit: Payer: Self-pay

## 2012-11-03 DIAGNOSIS — Z0289 Encounter for other administrative examinations: Secondary | ICD-10-CM

## 2012-11-03 MED ORDER — CIPROFLOXACIN HCL 500 MG PO TABS: 500.0000 mg | ORAL_TABLET | Freq: Two times a day (BID) | ORAL | Status: DC

## 2012-11-03 NOTE — Telephone Encounter (Signed)
Patient will be traveling to [place] for 3 weeks and is requesting antibiotic to take with her. OK per verbal order of Dr. Lenord Fellers for Cipro 500 mg as directed, sent to [facility].

## 2012-12-08 ENCOUNTER — Ambulatory Visit: Payer: PRIVATE HEALTH INSURANCE

## 2012-12-15 ENCOUNTER — Ambulatory Visit
Admission: RE | Admit: 2012-12-15 | Discharge: 2012-12-15 | Disposition: A | Discharge status: Home / Self Care | Payer: PRIVATE HEALTH INSURANCE | Source: Ambulatory Visit

## 2012-12-15 DIAGNOSIS — Z1231 Encounter for screening mammogram for malignant neoplasm of breast: Secondary | ICD-10-CM

## 2012-12-19 ENCOUNTER — Other Ambulatory Visit: Payer: Self-pay | Admitting: Internal Medicine

## 2012-12-29 ENCOUNTER — Other Ambulatory Visit: Payer: PRIVATE HEALTH INSURANCE | Admitting: Internal Medicine

## 2012-12-30 ENCOUNTER — Ambulatory Visit: Payer: PRIVATE HEALTH INSURANCE | Admitting: Internal Medicine

## 2013-01-19 ENCOUNTER — Other Ambulatory Visit: Payer: Self-pay | Admitting: Internal Medicine

## 2013-01-19 DIAGNOSIS — E039 Hypothyroidism, unspecified: Secondary | ICD-10-CM

## 2013-01-19 DIAGNOSIS — E785 Hyperlipidemia, unspecified: Secondary | ICD-10-CM

## 2013-01-19 DIAGNOSIS — Z79899 Other long term (current) drug therapy: Secondary | ICD-10-CM

## 2013-01-19 LAB — LIPID PANEL
Cholesterol: 156 mg/dL (ref 0–200)
HDL: 55 mg/dL (ref >39)
LDL Cholesterol: 81 mg/dL (ref 0–99)
Triglycerides: 102 mg/dL (ref <150)

## 2013-01-19 LAB — HEPATIC FUNCTION PANEL
ALT: 17 U/L (ref 0–35)
Albumin: 4.1 g/dL (ref 3.5–5.2)
Alkaline Phosphatase: 58 U/L (ref 39–117)
Total Protein: 6.8 g/dL (ref 6.0–8.3)

## 2013-01-20 ENCOUNTER — Ambulatory Visit (INDEPENDENT_AMBULATORY_CARE_PROVIDER_SITE_OTHER): Payer: PRIVATE HEALTH INSURANCE | Admitting: Internal Medicine

## 2013-01-20 ENCOUNTER — Encounter: Payer: Self-pay | Admitting: Internal Medicine

## 2013-01-20 VITALS — BP 110/64 | HR 72 | Wt 195.0 lb

## 2013-01-20 DIAGNOSIS — E039 Hypothyroidism, unspecified: Secondary | ICD-10-CM

## 2013-01-20 DIAGNOSIS — G250 Essential tremor: Secondary | ICD-10-CM

## 2013-01-20 DIAGNOSIS — Z23 Encounter for immunization: Secondary | ICD-10-CM

## 2013-01-20 DIAGNOSIS — E785 Hyperlipidemia, unspecified: Secondary | ICD-10-CM

## 2013-01-20 DIAGNOSIS — F411 Generalized anxiety disorder: Secondary | ICD-10-CM

## 2013-01-20 MED ORDER — ROSUVASTATIN CALCIUM 5 MG PO TABS: ORAL_TABLET | ORAL | Status: DC

## 2013-01-20 MED ORDER — ALPRAZOLAM 0.25 MG PO TABS: 0.2500 mg | ORAL_TABLET | Freq: Two times a day (BID) | ORAL | Status: DC | PRN

## 2013-01-20 MED ORDER — SYNTHROID 100 MCG PO TABS: ORAL_TABLET | ORAL | Status: DC

## 2013-01-20 NOTE — Patient Instructions (Addendum)
Continue same medications and return in 6 months 

## 2013-01-23 NOTE — Progress Notes (Signed)
  Subjective:    Patient ID: Tricia Miranda, female    DOB: 02/26/1966, 47 y.o.   MRN: [id number]  HPI 47 year old white female in today for six-month recheck. History of anxiety, essential tremor, hypothyroidism, hyperlipidemia. She is on Crestor. Lipid panel reviewed with her. For anxiety she takes Prozac and Xanax. For hypothyroidism, she is on Synthroid 0.1 mg daily. TSH is within normal limits. She has lots of questions and seems anxious about her health. She also has history of migraine headaches, vitamin D deficiency, basal cell carcinoma of left face 2009.     Review of Systems     Objective:   Physical Exam Skin warm and dry. Nodes none. Neck is supple without JVD thyromegaly or carotid bruits. Chest clear to auscultation. Cardiac exam regular rate and rhythm normal S1 and S2. Extremities without edema.        Assessment & Plan:  Hypothyroidism-stable on thyroid replacement therapy  Anxiety-stable on Prozac and Xanax  Essential tremor-stable  History of vitamin D deficiency-reminded take vitamin D 3 daily  History of migraine headaches  Hyperlipidemia-stable on Crestor  History of migraine headaches  Plan: Return March 2015 for physical examination. Continue same medications. Lipid panel reviewed with her and results are entirely within normal limits. Liver functions are normal. TSH normal. Influenza immunization given

## 2013-05-13 ENCOUNTER — Other Ambulatory Visit: Payer: Self-pay | Admitting: Internal Medicine

## 2013-06-26 ENCOUNTER — Other Ambulatory Visit: Payer: PRIVATE HEALTH INSURANCE | Admitting: Internal Medicine

## 2013-06-30 ENCOUNTER — Encounter: Payer: PRIVATE HEALTH INSURANCE | Admitting: Internal Medicine

## 2013-07-31 ENCOUNTER — Other Ambulatory Visit: Payer: PRIVATE HEALTH INSURANCE | Admitting: Internal Medicine

## 2013-08-01 ENCOUNTER — Other Ambulatory Visit: Payer: PRIVATE HEALTH INSURANCE | Admitting: Internal Medicine

## 2013-08-03 ENCOUNTER — Other Ambulatory Visit: Payer: PRIVATE HEALTH INSURANCE | Admitting: Internal Medicine

## 2013-08-03 DIAGNOSIS — Z130 Encounter for screening for diseases of the blood and blood-forming organs and certain disorders involving the immune mechanism: Secondary | ICD-10-CM

## 2013-08-03 DIAGNOSIS — Z13220 Encounter for screening for lipoid disorders: Secondary | ICD-10-CM

## 2013-08-03 DIAGNOSIS — E559 Vitamin D deficiency, unspecified: Secondary | ICD-10-CM

## 2013-08-03 DIAGNOSIS — Z0000 Encounter for general adult medical examination without abnormal findings: Secondary | ICD-10-CM

## 2013-08-03 DIAGNOSIS — E039 Hypothyroidism, unspecified: Secondary | ICD-10-CM

## 2013-08-03 LAB — CBC WITH DIFFERENTIAL/PLATELET
Basophils Absolute: 0.1 10*3/uL (ref 0.0–0.1)
Basophils Relative: 1 % (ref 0–1)
Eosinophils Absolute: 0.1 10*3/uL (ref 0.0–0.7)
Eosinophils Relative: 2 % (ref 0–5)
HEMATOCRIT: 38.5 % (ref 36.0–46.0)
HEMOGLOBIN: 13.4 g/dL (ref 12.0–15.0)
LYMPHS PCT: 38 % (ref 12–46)
Lymphs Abs: 2.2 10*3/uL (ref 0.7–4.0)
MCH: 29.4 pg (ref 26.0–34.0)
MCHC: 34.8 g/dL (ref 30.0–36.0)
MCV: 84.4 fL (ref 78.0–100.0)
MONO ABS: 0.5 10*3/uL (ref 0.1–1.0)
MONOS PCT: 8 % (ref 3–12)
NEUTROS ABS: 3 10*3/uL (ref 1.7–7.7)
Neutrophils Relative %: 51 % (ref 43–77)
Platelets: 272 10*3/uL (ref 150–400)
RBC: 4.56 MIL/uL (ref 3.87–5.11)
RDW: 13.2 % (ref 11.5–15.5)
WBC: 5.8 10*3/uL (ref 4.0–10.5)

## 2013-08-03 LAB — COMPREHENSIVE METABOLIC PANEL
ALBUMIN: 4.2 g/dL (ref 3.5–5.2)
ALT: 19 U/L (ref 0–35)
AST: 15 U/L (ref 0–37)
Alkaline Phosphatase: 62 U/L (ref 39–117)
BUN: 10 mg/dL (ref 6–23)
CALCIUM: 9.1 mg/dL (ref 8.4–10.5)
CHLORIDE: 101 meq/L (ref 96–112)
CO2: 27 meq/L (ref 19–32)
Creat: 0.61 mg/dL (ref 0.50–1.10)
GLUCOSE: 91 mg/dL (ref 70–99)
POTASSIUM: 4.1 meq/L (ref 3.5–5.3)
Sodium: 136 mEq/L (ref 135–145)
Total Bilirubin: 0.7 mg/dL (ref 0.2–1.2)
Total Protein: 6.7 g/dL (ref 6.0–8.3)

## 2013-08-03 LAB — LIPID PANEL
CHOLESTEROL: 156 mg/dL (ref 0–200)
HDL: 68 mg/dL (ref >39)
LDL CALC: 70 mg/dL (ref 0–99)
TRIGLYCERIDES: 89 mg/dL (ref <150)
Total CHOL/HDL Ratio: 2.3 Ratio
VLDL: 18 mg/dL (ref 0–40)

## 2013-08-03 LAB — TSH: TSH: 2.013 u[IU]/mL (ref 0.350–4.500)

## 2013-08-04 LAB — VITAMIN D 25 HYDROXY (VIT D DEFICIENCY, FRACTURES): Vit D, 25-Hydroxy: 53 ng/mL (ref 30–89)

## 2013-08-07 ENCOUNTER — Ambulatory Visit (INDEPENDENT_AMBULATORY_CARE_PROVIDER_SITE_OTHER): Payer: PRIVATE HEALTH INSURANCE | Admitting: Internal Medicine

## 2013-08-07 ENCOUNTER — Encounter: Payer: Self-pay | Admitting: Internal Medicine

## 2013-08-07 VITALS — BP 122/64 | HR 72 | Ht 66.75 in | Wt 209.0 lb

## 2013-08-07 DIAGNOSIS — G250 Essential tremor: Secondary | ICD-10-CM

## 2013-08-07 DIAGNOSIS — G252 Other specified forms of tremor: Secondary | ICD-10-CM

## 2013-08-07 DIAGNOSIS — E785 Hyperlipidemia, unspecified: Secondary | ICD-10-CM

## 2013-08-07 DIAGNOSIS — E039 Hypothyroidism, unspecified: Secondary | ICD-10-CM

## 2013-08-07 DIAGNOSIS — F411 Generalized anxiety disorder: Secondary | ICD-10-CM

## 2013-08-07 DIAGNOSIS — Z0000 Encounter for general adult medical examination without abnormal findings: Secondary | ICD-10-CM

## 2013-08-07 LAB — POCT URINALYSIS DIPSTICK
Bilirubin, UA: NEGATIVE
GLUCOSE UA: NEGATIVE
Ketones, UA: NEGATIVE
Leukocytes, UA: NEGATIVE
Nitrite, UA: NEGATIVE
Protein, UA: NEGATIVE
UROBILINOGEN UA: NEGATIVE
pH, UA: 6

## 2013-08-07 MED ORDER — SYNTHROID 100 MCG PO TABS: ORAL_TABLET | ORAL | Status: DC

## 2013-08-07 MED ORDER — ALPRAZOLAM 0.25 MG PO TABS: 0.2500 mg | ORAL_TABLET | Freq: Two times a day (BID) | ORAL | Status: DC | PRN

## 2013-08-07 MED ORDER — ROSUVASTATIN CALCIUM 5 MG PO TABS: ORAL_TABLET | ORAL | Status: DC

## 2013-08-07 NOTE — Progress Notes (Signed)
Subjective:    Patient ID: Tricia Miranda, female    DOB: Jul 17, 1965, 48 y.o.   MRN: [id number]  HPI  48 year old White female for health maintenance and evaluation of medical issues. History of anxiety and hypothyroidism. History of fibrocystic breast disease, essential tremor, migraine headache, vitamin D deficiency.  Past medical history: basal cell carcinoma left face 2009. Had right comminuted humerus fracture October 2007, left fibula fracture March 2009.  Tetanus immunization 2007.  Patient stopped Prozac and 2013 but anxiety returned. She has a good deal of obsessive thinking about her health and worries a lot. She thought she had myalgias on Zocor and is now on Crestor.  When she was living in [place] a few years ago, fine tremor was noted. She had some falls while on a hiking trip. A question was raised about multiple sclerosis. She actually had 2 MRIs studies but there is only one on file in 2002 with and without gadolinium showing a punctate focus of T2 hypersensitivity in the superior left frontal white matter described as nonspecific. There was also a tiny focus of enhancement in the immediate pons probably representing capillary dilatation. She was evaluated by a neurologist at [facility]. She also subsequently saw someone at [place] and had more testing. Somewhere along the way had nerve conduction testing. Says it may have reveal something "interesting". I do not have a copy of that report. In any case, I think the question of multiple sclerosis always remains in her mind.  Has had issues with diarrhea from time to time consistent with irritable bowel syndrome.  Social history: Patient's husband as a Curator at [employer]. Patient works for the [employer]. She and her husband moved here from [place] so that he could take a job at the school of [employer]. One son.  Family history: Father with history of MI and alcoholism.  Mother in good health. One sister in good health. Maternal grandmother with history of diabetes mellitus.      Review of Systems  Constitutional: Negative.   All other systems reviewed and are negative.      Objective:   Physical Exam  Vitals reviewed. Constitutional: She is oriented to person, place, and time. She appears well-developed and well-nourished. No distress.  HENT:  Head: Normocephalic and atraumatic.  Right Ear: External ear normal.  Left Ear: External ear normal.  Nose: Nose normal.  Mouth/Throat: Oropharynx is clear and moist. No oropharyngeal exudate.  Eyes: Conjunctivae and EOM are normal. Pupils are equal, round, and reactive to light. Right eye exhibits no discharge. Left eye exhibits no discharge. No scleral icterus.  Wears glasses  Neck: Neck supple. No JVD present. No thyromegaly present.  Cardiovascular: Normal rate, regular rhythm, normal heart sounds and intact distal pulses.   No murmur heard. Pulmonary/Chest: Breath sounds normal. No respiratory distress. She has no wheezes. She has no rales. She exhibits no tenderness.  Abdominal: Soft. Bowel sounds are normal.  Genitourinary:  Deferred to GYN  Musculoskeletal: She exhibits no edema.  Lymphadenopathy:    She has no cervical adenopathy.  Neurological: She is alert and oriented to person, place, and time. She has normal reflexes. No cranial nerve deficit. Coordination normal.  Essential tremor  Skin: Skin is warm and dry. She is not diaphoretic.  Psychiatric: She has a normal mood and affect. Her behavior is normal. Judgment and thought content normal.          Assessment &  Plan:  Hyperlipidemia  Anxiety  Essential tremor  Hypothyroidism  Plan: All of these issues are stable. She is to return in 6 months for office visit and TSH and lipid panel with liver functions. Continue same medications.

## 2013-08-07 NOTE — Patient Instructions (Signed)
Continue same medications and return in 6 months 

## 2014-01-28 ENCOUNTER — Other Ambulatory Visit: Payer: Self-pay | Admitting: Internal Medicine

## 2014-02-09 ENCOUNTER — Other Ambulatory Visit: Payer: PRIVATE HEALTH INSURANCE | Admitting: Internal Medicine

## 2014-02-16 ENCOUNTER — Ambulatory Visit: Payer: PRIVATE HEALTH INSURANCE | Admitting: Internal Medicine

## 2014-02-20 ENCOUNTER — Other Ambulatory Visit: Payer: PRIVATE HEALTH INSURANCE | Admitting: Internal Medicine

## 2014-02-20 DIAGNOSIS — E039 Hypothyroidism, unspecified: Secondary | ICD-10-CM

## 2014-02-20 LAB — TSH: TSH: 2.007 u[IU]/mL (ref 0.350–4.500)

## 2014-02-26 ENCOUNTER — Other Ambulatory Visit: Payer: Self-pay | Admitting: Obstetrics & Gynecology

## 2014-02-27 ENCOUNTER — Other Ambulatory Visit: Payer: PRIVATE HEALTH INSURANCE | Admitting: Internal Medicine

## 2014-02-28 ENCOUNTER — Other Ambulatory Visit: Payer: Self-pay

## 2014-02-28 MED ORDER — ALPRAZOLAM 0.25 MG PO TABS: 0.2500 mg | ORAL_TABLET | Freq: Two times a day (BID) | ORAL | Status: DC | PRN

## 2014-03-01 ENCOUNTER — Ambulatory Visit: Payer: PRIVATE HEALTH INSURANCE | Admitting: Internal Medicine

## 2014-03-06 ENCOUNTER — Ambulatory Visit (INDEPENDENT_AMBULATORY_CARE_PROVIDER_SITE_OTHER): Payer: PRIVATE HEALTH INSURANCE | Admitting: Internal Medicine

## 2014-03-06 ENCOUNTER — Encounter: Payer: Self-pay | Admitting: Internal Medicine

## 2014-03-06 VITALS — BP 128/88 | HR 65 | Temp 97.4°F | Wt 210.0 lb

## 2014-03-06 DIAGNOSIS — F411 Generalized anxiety disorder: Secondary | ICD-10-CM

## 2014-03-06 DIAGNOSIS — Z23 Encounter for immunization: Secondary | ICD-10-CM

## 2014-03-06 DIAGNOSIS — E039 Hypothyroidism, unspecified: Secondary | ICD-10-CM | POA: Diagnosis not present

## 2014-03-06 MED ORDER — ROSUVASTATIN CALCIUM 5 MG PO TABS: ORAL_TABLET | ORAL | Status: DC

## 2014-03-06 NOTE — Progress Notes (Signed)
   Subjective:    Patient ID: Tricia Miranda, female    DOB: Feb 09, 1966, 48 y.o.   MRN: [id number]  HPI  To have D and C the day after Thanksgiving by Dr. Dellis Filbert. Has had menses up to 14 days un duration. Has been told she has a 7 cm  uterine fibroid. For follow up of hypothyroidism and anxiety.   Symptoms are stable on current regimen. Long-standing history of anxiety. Anxiety treated with Xanax and Prozac. TSH checked earlier this month is within normal limits.    Review of Systems     Objective:   Physical Exam  No thyromegaly. Affect appropriate.      Assessment & Plan:  Hypothyroidism-TSH stable on current dose of thyroid replacement   Anxiety-treated with Xanax and Prozac  Plan: Continue same medications and return in 6 months.

## 2014-03-16 ENCOUNTER — Ambulatory Visit (HOSPITAL_COMMUNITY): Payer: PRIVATE HEALTH INSURANCE | Admitting: Registered Nurse

## 2014-03-16 ENCOUNTER — Ambulatory Visit (HOSPITAL_COMMUNITY)
Admission: RE | Admit: 2014-03-16 | Discharge: 2014-03-16 | Disposition: A | Discharge status: Home / Self Care | Payer: PRIVATE HEALTH INSURANCE | Source: Ambulatory Visit | Attending: Obstetrics & Gynecology | Admitting: Obstetrics & Gynecology

## 2014-03-16 ENCOUNTER — Encounter (HOSPITAL_COMMUNITY)
Admission: RE | Disposition: A | Discharge status: Home / Self Care | Payer: Self-pay | Source: Ambulatory Visit | Attending: Obstetrics & Gynecology

## 2014-03-16 ENCOUNTER — Encounter (HOSPITAL_COMMUNITY): Payer: Self-pay | Admitting: Anesthesiology

## 2014-03-16 DIAGNOSIS — E039 Hypothyroidism, unspecified: Secondary | ICD-10-CM | POA: Insufficient documentation

## 2014-03-16 DIAGNOSIS — N840 Polyp of corpus uteri: Secondary | ICD-10-CM | POA: Insufficient documentation

## 2014-03-16 DIAGNOSIS — F1099 Alcohol use, unspecified with unspecified alcohol-induced disorder: Secondary | ICD-10-CM | POA: Diagnosis not present

## 2014-03-16 DIAGNOSIS — F419 Anxiety disorder, unspecified: Secondary | ICD-10-CM | POA: Insufficient documentation

## 2014-03-16 DIAGNOSIS — N920 Excessive and frequent menstruation with regular cycle: Secondary | ICD-10-CM | POA: Diagnosis not present

## 2014-03-16 HISTORY — PX: DILITATION & CURRETTAGE/HYSTROSCOPY WITH VERSAPOINT RESECTION: SHX5571

## 2014-03-16 LAB — CBC
HEMATOCRIT: 41.5 % (ref 36.0–46.0)
Hemoglobin: 14 g/dL (ref 12.0–15.0)
MCH: 29.4 pg (ref 26.0–34.0)
MCHC: 33.7 g/dL (ref 30.0–36.0)
MCV: 87.2 fL (ref 78.0–100.0)
PLATELETS: 252 10*3/uL (ref 150–400)
RBC: 4.76 MIL/uL (ref 3.87–5.11)
RDW: 13.1 % (ref 11.5–15.5)
WBC: 5.3 10*3/uL (ref 4.0–10.5)

## 2014-03-16 LAB — PREGNANCY, URINE: Preg Test, Ur: NEGATIVE

## 2014-03-16 SURGERY — DILATATION & CURETTAGE/HYSTEROSCOPY WITH VERSAPOINT RESECTION
Anesthesia: General | Site: Uterus

## 2014-03-16 MED ORDER — PROPOFOL 10 MG/ML IV BOLUS
INTRAVENOUS | Status: DC | PRN
  Administered 2014-03-16: 150 mg via INTRAVENOUS
  Administered 2014-03-16: 30 mg via INTRAVENOUS

## 2014-03-16 MED ORDER — ONDANSETRON HCL 4 MG/2ML IJ SOLN
INTRAMUSCULAR | Status: DC | PRN
  Administered 2014-03-16: 4 mg via INTRAVENOUS

## 2014-03-16 MED ORDER — DEXAMETHASONE SODIUM PHOSPHATE 10 MG/ML IJ SOLN
INTRAMUSCULAR | Status: AC
  Filled 2014-03-16: qty 1

## 2014-03-16 MED ORDER — CHLOROPROCAINE HCL 1 % IJ SOLN
INTRAMUSCULAR | Status: AC
Start: 2014-03-16 — End: 2014-03-16
  Filled 2014-03-16: qty 30

## 2014-03-16 MED ORDER — FENTANYL CITRATE 0.05 MG/ML IJ SOLN
INTRAMUSCULAR | Status: DC | PRN
  Administered 2014-03-16: 100 ug via INTRAVENOUS

## 2014-03-16 MED ORDER — SILVER NITRATE-POT NITRATE 75-25 % EX MISC
CUTANEOUS | Status: AC
  Filled 2014-03-16: qty 1

## 2014-03-16 MED ORDER — LIDOCAINE HCL (CARDIAC) 20 MG/ML IV SOLN
INTRAVENOUS | Status: DC | PRN
  Administered 2014-03-16: 50 mg via INTRAVENOUS

## 2014-03-16 MED ORDER — CHLOROPROCAINE HCL 1 % IJ SOLN
INTRAMUSCULAR | Status: DC | PRN
  Administered 2014-03-16: 20 mL

## 2014-03-16 MED ORDER — PROPOFOL 10 MG/ML IV EMUL
INTRAVENOUS | Status: AC
  Filled 2014-03-16: qty 20

## 2014-03-16 MED ORDER — LIDOCAINE HCL (CARDIAC) 20 MG/ML IV SOLN
INTRAVENOUS | Status: AC
  Filled 2014-03-16: qty 5

## 2014-03-16 MED ORDER — SCOPOLAMINE 1 MG/3DAYS TD PT72
1.0000 | MEDICATED_PATCH | Freq: Once | TRANSDERMAL | Status: DC
  Administered 2014-03-16: 1.5 mg via TRANSDERMAL

## 2014-03-16 MED ORDER — SODIUM CHLORIDE 0.9 % IR SOLN
Status: DC | PRN
  Administered 2014-03-16: 3000 mL

## 2014-03-16 MED ORDER — ONDANSETRON HCL 4 MG/2ML IJ SOLN
INTRAMUSCULAR | Status: AC
  Filled 2014-03-16: qty 2

## 2014-03-16 MED ORDER — MIDAZOLAM HCL 2 MG/2ML IJ SOLN
INTRAMUSCULAR | Status: DC | PRN
  Administered 2014-03-16: 2 mg via INTRAVENOUS

## 2014-03-16 MED ORDER — DEXAMETHASONE SODIUM PHOSPHATE 10 MG/ML IJ SOLN
INTRAMUSCULAR | Status: DC | PRN
  Administered 2014-03-16: 10 mg via INTRAVENOUS

## 2014-03-16 MED ORDER — CEFAZOLIN SODIUM-DEXTROSE 2-3 GM-% IV SOLR
INTRAVENOUS | Status: AC
  Filled 2014-03-16: qty 50

## 2014-03-16 MED ORDER — KETOROLAC TROMETHAMINE 30 MG/ML IJ SOLN
INTRAMUSCULAR | Status: AC
  Filled 2014-03-16: qty 1

## 2014-03-16 MED ORDER — MIDAZOLAM HCL 2 MG/2ML IJ SOLN
INTRAMUSCULAR | Status: AC
  Filled 2014-03-16: qty 2

## 2014-03-16 MED ORDER — SCOPOLAMINE 1 MG/3DAYS TD PT72
MEDICATED_PATCH | TRANSDERMAL | Status: AC
  Administered 2014-03-16: 1.5 mg via TRANSDERMAL
  Filled 2014-03-16: qty 1

## 2014-03-16 MED ORDER — CEFAZOLIN SODIUM-DEXTROSE 2-3 GM-% IV SOLR
2.0000 g | INTRAVENOUS | Status: AC
  Administered 2014-03-16: 2 g via INTRAVENOUS

## 2014-03-16 MED ORDER — FENTANYL CITRATE 0.05 MG/ML IJ SOLN
INTRAMUSCULAR | Status: AC
  Filled 2014-03-16: qty 5

## 2014-03-16 MED ORDER — KETOROLAC TROMETHAMINE 30 MG/ML IJ SOLN
INTRAMUSCULAR | Status: DC | PRN
  Administered 2014-03-16: 30 mg via INTRAVENOUS

## 2014-03-16 MED ORDER — LACTATED RINGERS IV SOLN
INTRAVENOUS | Status: DC
  Administered 2014-03-16: 12:00:00 via INTRAVENOUS

## 2014-03-16 MED ORDER — HYDROCODONE-IBUPROFEN 5-200 MG PO TABS: 1.0000 | ORAL_TABLET | Freq: Four times a day (QID) | ORAL | Status: DC | PRN

## 2014-03-16 SURGICAL SUPPLY — 16 items
CANISTER SUCT 3000ML (MISCELLANEOUS) ×2 IMPLANT
CATH ROBINSON RED A/P 16FR (CATHETERS) ×2 IMPLANT
CLOTH BEACON ORANGE TIMEOUT ST (SAFETY) ×2 IMPLANT
CONTAINER PREFILL 10% NBF 60ML (FORM) ×4 IMPLANT
ELECTRODE ROLLER VERSAPOINT (ELECTRODE) ×2 IMPLANT
ELECTRODE RT ANGLE VERSAPOINT (CUTTING LOOP) ×1 IMPLANT
GLOVE BIO SURGEON STRL SZ 6.5 (GLOVE) ×2 IMPLANT
GLOVE BIOGEL PI IND STRL 7.0 (GLOVE) ×2 IMPLANT
GLOVE BIOGEL PI INDICATOR 7.0 (GLOVE) ×2
GOWN STRL REUS W/TWL LRG LVL3 (GOWN DISPOSABLE) ×4 IMPLANT
PACK VAGINAL MINOR WOMEN LF (CUSTOM PROCEDURE TRAY) ×2 IMPLANT
PAD OB MATERNITY 4.3X12.25 (PERSONAL CARE ITEMS) ×2 IMPLANT
TOWEL OR 17X24 6PK STRL BLUE (TOWEL DISPOSABLE) ×4 IMPLANT
TUBING AQUILEX INFLOW (TUBING) ×2 IMPLANT
TUBING AQUILEX OUTFLOW (TUBING) ×2 IMPLANT
WATER STERILE IRR 1000ML POUR (IV SOLUTION) ×2 IMPLANT

## 2014-03-16 NOTE — Discharge Instructions (Addendum)
Hysteroscopy, Care After Refer to this sheet in the next few weeks. These instructions provide you with information on caring for yourself after your procedure. Your health care provider may also give you more specific instructions. Your treatment has been planned according to current medical practices, but problems sometimes occur. Call your health care provider if you have any problems or questions after your procedure.  WHAT TO EXPECT AFTER THE PROCEDURE After your procedure, it is typical to have the following:  You may have some cramping. This normally lasts for a couple days.  You may have bleeding. This can vary from light spotting for a few days to menstrual-like bleeding for 3-7 days. HOME CARE INSTRUCTIONS  Rest for the first 1-2 days after the procedure.  Only take over-the-counter or prescription medicines as directed by your health care provider. Do not take aspirin. It can increase the chances of bleeding.  Take showers instead of baths for 2 weeks or as directed by your health care provider.  Do not drive for 24 hours or as directed.  Do not drink alcohol while taking pain medicine.  Do not use tampons, douche, or have sexual intercourse for 2 weeks or until your health care provider says it is okay.  Take your temperature twice a day for 4-5 days. Write it down each time.  Follow your health care provider's advice about diet, exercise, and lifting.  If you develop constipation, you may:  Take a mild laxative if your health care provider approves.  Add bran foods to your diet.  Drink enough fluids to keep your urine clear or pale yellow.  Try to have someone with you or available to you for the first 24-48 hours, especially if you were given a general anesthetic.  Follow up with your health care provider as directed. SEEK MEDICAL CARE IF:  You feel dizzy or lightheaded.  You feel sick to your stomach (nauseous).  You have abnormal vaginal discharge.  You  have a rash.  You have pain that is not controlled with medicine. SEEK IMMEDIATE MEDICAL CARE IF:  You have bleeding that is heavier than a normal menstrual period.  You have a fever.  You have increasing cramps or pain, not controlled with medicine.  You have new belly (abdominal) pain.  You pass out.  You have pain in the tops of your shoulders (shoulder strap areas).  You have shortness of breath. Document Released: 01/25/2013 Document Reviewed: 01/25/2013 [facility] Patient Information 2015 [place], [place]. This information is not intended to replace advice given to you by your health care provider. Make sure you discuss any questions you have with your health care provider.   DISCHARGE INSTRUCTIONS: D&C  The following instructions have been prepared to help you care for yourself upon your return home.  MAY TAKE IBUPROFEN (MOTRIN, ADVIL) OR ALEVE AFTER 7:20 PM FOR CRAMPS!!!  DO NOT TAKE IBUPROFEN WITH PRESCRIBED PAIN MEDICATION!!!!   Personal hygiene:  Use sanitary pads for vaginal drainage, not tampons.  Shower the day after your procedure.  NO tub baths, pools or Jacuzzis for 2-3 weeks.  Wipe front to back after using the bathroom.  Activity and limitations:  Do NOT drive or operate any equipment for 24 hours. The effects of anesthesia are still present and drowsiness may result.  Do NOT rest in bed all day.  Walking is encouraged.  Walk up and down stairs slowly.  You may resume your normal activity in one to two days or as indicated by your  physician.  Sexual activity: NO intercourse for at least 2 weeks after the procedure, or as indicated by your physician.  Diet: Eat a light meal as desired this evening. You may resume your usual diet tomorrow.  Return to work: You may resume your work activities in one to two days or as indicated by your doctor.  What to expect after your surgery: Expect to have vaginal bleeding/discharge for 2-3 days and spotting for  up to 10 days. It is not unusual to have soreness for up to 1-2 weeks. You may have a slight burning sensation when you urinate for the first day. Mild cramps may continue for a couple of days. You may have a regular period in 2-6 weeks.  Call your doctor for any of the following:  Excessive vaginal bleeding, saturating and changing one pad every hour.  Inability to urinate 6 hours after discharge from hospital.  Pain not relieved by pain medication.  Fever of 100.4 F or greater.  Unusual vaginal discharge or odor.   Call for an appointment:    Patients signature: ______________________  Nurses signature ________________________  Support person's signature_______________________

## 2014-03-16 NOTE — H&P (Signed)
Tricia Miranda is an 48 y.o. female G1P1  RP:  Menometro with endometrial polyps  Pertinent Gynecological History:  Contraception: condoms Blood transfusions: none Sexually transmitted diseases: no past history  Last mammogram: normal  Last pap: normal  OB History: G1P1   Menstrual History: No LMP recorded.    Past Medical History  Diagnosis Date  . Thyroid disease     hypothyroidism  . Anxiety     History reviewed. No pertinent past surgical history.  History reviewed. No pertinent family history.  Social History:  reports that she has never smoked. She has never used smokeless tobacco. She reports that she drinks about 1.5 oz of alcohol per week. She reports that she does not use illicit drugs.  Allergies: No Known Allergies  Prescriptions prior to admission  Medication Sig Dispense Refill Last Dose  . acetaminophen (TYLENOL) 325 MG tablet Take 650 mg by mouth every 6 (six) hours as needed for headache.   03/15/2014 at Unknown time  . ALPRAZolam (XANAX) 0.25 MG tablet Take 1 tablet (0.25 mg total) by mouth 2 (two) times daily as needed. (Patient taking differently: Take 0.25 mg by mouth 2 (two) times daily as needed for anxiety. ) 60 tablet 5 03/15/2014 at Unknown time  . cetirizine (ZYRTEC) 10 MG tablet Take 10 mg by mouth daily as needed for allergies.   03/15/2014 at Unknown time  . cholecalciferol (VITAMIN D) 1000 UNITS tablet Take 5,000 Units by mouth daily.    03/15/2014 at Unknown time  . ferrous sulfate 325 (65 FE) MG tablet Take 325 mg by mouth daily with breakfast.   03/15/2014 at Unknown time  . FLUoxetine (PROZAC) 10 MG capsule TAKE 1 CAPSULE BY MOUTH DAILY 30 capsule 11 03/15/2014 at Unknown time  . pseudoephedrine (SUDAFED) 60 MG tablet Take 60 mg by mouth 2 (two) times daily as needed for congestion.   Taking  . rosuvastatin (CRESTOR) 5 MG tablet TAKE 1 TABLET (5 MG TOTAL) BY MOUTH DAILY. 90 tablet 3 03/16/2014 at Unknown time  . SYNTHROID 100 MCG tablet TAKE  1 TABLET BY MOUTH DAILY 30 tablet 5 03/16/2014 at Unknown time    ROS  Blood pressure 119/76, pulse 69, temperature 99.1 F (37.3 C), temperature source Oral, resp. rate 18, height 5' 7.5" (1.715 m), weight 95.255 kg (210 lb), SpO2 98 %. Physical Exam   [place]:  2 endometrial polyps  Results for orders placed or performed during the hospital encounter of 03/16/14 (from the past 24 hour(s))  CBC     Status: None   Collection Time: 03/16/14 11:30 AM  Result Value Ref Range   WBC 5.3 4.0 - 10.5 K/uL   RBC 4.76 3.87 - 5.11 MIL/uL   Hemoglobin 14.0 12.0 - 15.0 g/dL   HCT 41.5 36.0 - 46.0 %   MCV 87.2 78.0 - 100.0 fL   MCH 29.4 26.0 - 34.0 pg   MCHC 33.7 30.0 - 36.0 g/dL   RDW 13.1 11.5 - 15.5 %   Platelets 252 150 - 400 K/uL  Pregnancy, urine     Status: None   Collection Time: 03/16/14 11:30 AM  Result Value Ref Range   Preg Test, Ur NEGATIVE NEGATIVE    No results found.  Assessment/Plan: Menometro with endometrial polyps for [place] resection, D+C  Tricia Miranda,MARIE-LYNE 03/16/2014, 12:50 PM

## 2014-03-16 NOTE — Discharge Summary (Signed)
  Physician Discharge Summary  Patient ID: Tricia Miranda MRN: [id number] DOB/AGE: June 13, 1965 48 y.o.  Admit date: 03/16/2014 Discharge date: 03/16/2014  Admission Diagnoses: Endometrial Polyps  Discharge Diagnoses: Endometrial Polyps        Active Problems:   * No active hospital problems. *   Discharged Condition: good  Hospital Course: Outpatient  Consults: None  Treatments: surgery: Hysteroscopy with Versapoint resection and Dilation and Curettage  Disposition: Final discharge disposition not confirmed     Medication List    TAKE these medications        acetaminophen 325 MG tablet  Commonly known as:  TYLENOL  Take 650 mg by mouth every 6 (six) hours as needed for headache.     ALPRAZolam 0.25 MG tablet  Commonly known as:  XANAX  Take 1 tablet (0.25 mg total) by mouth 2 (two) times daily as needed.     cetirizine 10 MG tablet  Commonly known as:  ZYRTEC  Take 10 mg by mouth daily as needed for allergies.     cholecalciferol 1000 UNITS tablet  Commonly known as:  VITAMIN D  Take 5,000 Units by mouth daily.     ferrous sulfate 325 (65 FE) MG tablet  Take 325 mg by mouth daily with breakfast.     FLUoxetine 10 MG capsule  Commonly known as:  PROZAC  TAKE 1 CAPSULE BY MOUTH DAILY     hydrocodone-ibuprofen 5-200 MG per tablet  Commonly known as:  VICOPROFEN  Take 1 tablet by mouth every 6 (six) hours as needed for pain.     pseudoephedrine 60 MG tablet  Commonly known as:  SUDAFED  Take 60 mg by mouth 2 (two) times daily as needed for congestion.     rosuvastatin 5 MG tablet  Commonly known as:  CRESTOR  TAKE 1 TABLET (5 MG TOTAL) BY MOUTH DAILY.     SYNTHROID 100 MCG tablet  Generic drug:  levothyroxine  TAKE 1 TABLET BY MOUTH DAILY           Follow-up Information    Follow up with Xiara Knisley,MARIE-LYNE, MD In 3 weeks.   Specialty:  Obstetrics and Gynecology   Contact information:   [address] [place] [address] [phone]        Signed: [name], MD 03/16/2014, 1:42 PM

## 2014-03-16 NOTE — Op Note (Signed)
03/16/2014  1:30 PM  PATIENT:  Tricia Miranda  48 y.o. female  PRE-OPERATIVE DIAGNOSIS:  Endometrial Polyps  POST-OPERATIVE DIAGNOSIS:  endometrial polyps  PROCEDURE:  Procedure(s): DILATATION & CURETTAGE/HYSTEROSCOPY WITH VERSAPOINT RESECTION  SURGEON:  Surgeon(s): [name], MD  ASSISTANTS: none   ANESTHESIA:   general   PROCEDURE:  Under general anesthesia with laryngeal mask the patient is an lithotomy position. She is prepped with Betadine on the suprapubic, vulvar and vaginal areas. She is draped as usual.  The bladder was catheterized and 75 cc of urine is removed.  The vaginal exam reveals an anteverted uterus increased in volume with an anterior myoma measuring about 7 cm. No adnexal mass. The speculum is inserted in the vagina and the anterior lip of the cervix is grasped with a tenaculum. A paracervical block is done with Nesacaine 1% a total of 20 cc at 4 and 8:00. Dilation of the cervix with Hegar dilators up to #31 without difficulty.  Insertion of the hysteroscope with the VersaPoint instrument using the loop.  The intrauterine cavity is inspected and reveals multiple endometrial polyps on the posterior aspect and the left lateral wall of the cavity.  A small polyp is also present in the right ostium.  Both ostia are visualized and the cavity appears normal in shape.  No evidence of submucosal myoma.  We used the VersaPoint loop to resect all endometrial polyps.  The polyp in the right ostium is also resected.  We then remove the hysteroscope and proceed with a systematic curettage of the intrauterine cavity on all surfaces with a sharp curette.  We then go back with the hysteroscope to verify that no other lesion is present and confirmed good hemostasis.  The hysteroscope was then removed. We removed the tenaculum on the cervix and used silver nitrate to control hemostasis at that level. The speculum was also removed. The patient is brought to recovery room in good and  stable status. Pictures were taken before and after resection.  ESTIMATED BLOOD LOSS: 5 cc FLUID DEFICIT: 975 cc   Intake/Output Summary (Last 24 hours) at 03/16/14 1330 Last data filed at 03/16/14 1328  Gross per 24 hour  Intake   1300 ml  Output    155 ml  Net   1145 ml     BLOOD ADMINISTERED:none   LOCAL MEDICATIONS USED:  Nesacaine 1%, Amount: 20 ml  SPECIMEN:  Source of Specimen:  Endometrial curettings and resection of polyps  DISPOSITION OF SPECIMEN:  PATHOLOGY  COUNTS:  YES  PLAN OF CARE: Transfer to PACU  [name] MD  03/16/2014  1:30 pm

## 2014-03-16 NOTE — Anesthesia Postprocedure Evaluation (Signed)
Immediate Anesthesia Transfer of Care Note  Patient: Tricia Miranda  Procedure(s) Performed: Procedure(s) with comments: DILATATION & CURETTAGE/HYSTEROSCOPY WITH VERSAPOINT RESECTION (N/A) - 1 hr.  Patient Location: PACU  Anesthesia Type:General  Level of Consciousness: awake  Airway & Oxygen Therapy: Patient Spontanous Breathing  Post-op Assessment: Report given to PACU RN  Post vital signs: stable  Filed Vitals:   03/16/14 1134  BP: 119/76  Pulse: 69  Temp: 37.3 C  Resp: 18    Complications: No apparent anesthesia complications

## 2014-03-16 NOTE — Anesthesia Postprocedure Evaluation (Signed)
Anesthesia Post Note  Patient: Tricia Miranda  Procedure(s) Performed: Procedure(s) (LRB): DILATATION & CURETTAGE/HYSTEROSCOPY WITH VERSAPOINT RESECTION (N/A)  Anesthesia type: General  Patient location: PACU  Post pain: Pain level controlled  Post assessment: Post-op Vital signs reviewed  Last Vitals:  Filed Vitals:   03/16/14 1345  BP: 112/60  Pulse: 70  Temp:   Resp: 19    Post vital signs: Reviewed  Level of consciousness: sedated  Complications: No apparent anesthesia complications

## 2014-03-16 NOTE — Anesthesia Preprocedure Evaluation (Signed)
Anesthesia Evaluation  Patient identified by MRN, date of birth, ID band Patient awake    Reviewed: Allergy & Precautions, H&P , NPO status , Patient's Chart, lab work & pertinent test results, reviewed documented beta blocker date and time   Airway Mallampati: I  TM Distance: >3 FB Neck ROM: full    Dental no notable dental hx. (+) Teeth Intact   Pulmonary neg pulmonary ROS,          Cardiovascular negative cardio ROS      Neuro/Psych Anxiety    GI/Hepatic negative GI ROS, Neg liver ROS,   Endo/Other  Hypothyroidism   Renal/GU negative Renal ROS     Musculoskeletal   Abdominal Normal abdominal exam  (+)   Peds  Hematology negative hematology ROS (+)   Anesthesia Other Findings   Reproductive/Obstetrics negative OB ROS                             Anesthesia Physical Anesthesia Plan  ASA: II  Anesthesia Plan: General   Post-op Pain Management:    Induction: Intravenous  Airway Management Planned: LMA  Additional Equipment:   Intra-op Plan:   Post-operative Plan:   Informed Consent: I have reviewed the patients History and Physical, chart, labs and discussed the procedure including the risks, benefits and alternatives for the proposed anesthesia with the patient or authorized representative who has indicated his/her understanding and acceptance.     Plan Discussed with: CRNA and Surgeon  Anesthesia Plan Comments:         Anesthesia Quick Evaluation

## 2014-03-16 NOTE — Transfer of Care (Signed)
Immediate Anesthesia Transfer of Care Note  Patient: Tricia Miranda  Procedure(s) Performed: Procedure(s) with comments: DILATATION & CURETTAGE/HYSTEROSCOPY WITH VERSAPOINT RESECTION (N/A) - 1 hr.  Patient Location: PACU  Anesthesia Type:General  Level of Consciousness: sedated  Airway & Oxygen Therapy: Patient Spontanous Breathing  Post-op Assessment: Report given to PACU RN  Post vital signs: Reviewed and stable  Complications: No apparent anesthesia complications

## 2014-03-19 ENCOUNTER — Encounter (HOSPITAL_COMMUNITY): Payer: Self-pay | Admitting: Obstetrics & Gynecology

## 2014-05-26 ENCOUNTER — Other Ambulatory Visit: Payer: Self-pay | Admitting: Internal Medicine

## 2014-06-16 ENCOUNTER — Encounter: Payer: Self-pay | Admitting: Internal Medicine

## 2014-06-16 NOTE — Patient Instructions (Signed)
Continue same medications and return in 6 months 

## 2014-07-14 ENCOUNTER — Other Ambulatory Visit: Payer: Self-pay | Admitting: Internal Medicine

## 2014-07-16 ENCOUNTER — Other Ambulatory Visit: Payer: Self-pay | Admitting: Internal Medicine

## 2014-08-20 ENCOUNTER — Other Ambulatory Visit: Payer: PRIVATE HEALTH INSURANCE | Admitting: Internal Medicine

## 2014-08-23 ENCOUNTER — Encounter: Payer: PRIVATE HEALTH INSURANCE | Admitting: Internal Medicine

## 2014-09-20 ENCOUNTER — Other Ambulatory Visit: Payer: PRIVATE HEALTH INSURANCE | Admitting: Internal Medicine

## 2014-09-20 ENCOUNTER — Other Ambulatory Visit: Payer: Self-pay | Admitting: Internal Medicine

## 2014-09-20 DIAGNOSIS — Z1329 Encounter for screening for other suspected endocrine disorder: Secondary | ICD-10-CM

## 2014-09-20 DIAGNOSIS — Z130 Encounter for screening for diseases of the blood and blood-forming organs and certain disorders involving the immune mechanism: Secondary | ICD-10-CM

## 2014-09-20 DIAGNOSIS — Z1321 Encounter for screening for nutritional disorder: Secondary | ICD-10-CM

## 2014-09-20 DIAGNOSIS — Z0000 Encounter for general adult medical examination without abnormal findings: Secondary | ICD-10-CM

## 2014-09-20 DIAGNOSIS — Z13220 Encounter for screening for lipoid disorders: Secondary | ICD-10-CM

## 2014-09-20 LAB — CBC WITH DIFFERENTIAL/PLATELET
Basophils Absolute: 0.1 10*3/uL (ref 0.0–0.1)
Basophils Relative: 1 % (ref 0–1)
Eosinophils Absolute: 0.1 10*3/uL (ref 0.0–0.7)
Eosinophils Relative: 2 % (ref 0–5)
HCT: 38.8 % (ref 36.0–46.0)
Hemoglobin: 13.1 g/dL (ref 12.0–15.0)
Lymphocytes Relative: 33 % (ref 12–46)
Lymphs Abs: 2.1 10*3/uL (ref 0.7–4.0)
MCH: 28.9 pg (ref 26.0–34.0)
MCHC: 33.8 g/dL (ref 30.0–36.0)
MCV: 85.5 fL (ref 78.0–100.0)
MONOS PCT: 8 % (ref 3–12)
MPV: 9.3 fL (ref 8.6–12.4)
Monocytes Absolute: 0.5 10*3/uL (ref 0.1–1.0)
NEUTROS PCT: 56 % (ref 43–77)
Neutro Abs: 3.6 10*3/uL (ref 1.7–7.7)
PLATELETS: 282 10*3/uL (ref 150–400)
RBC: 4.54 MIL/uL (ref 3.87–5.11)
RDW: 13.7 % (ref 11.5–15.5)
WBC: 6.5 10*3/uL (ref 4.0–10.5)

## 2014-09-20 LAB — COMPLETE METABOLIC PANEL WITH GFR
ALT: 16 U/L (ref 0–35)
AST: 14 U/L (ref 0–37)
Albumin: 4.1 g/dL (ref 3.5–5.2)
Alkaline Phosphatase: 65 U/L (ref 39–117)
BUN: 11 mg/dL (ref 6–23)
CALCIUM: 8.7 mg/dL (ref 8.4–10.5)
CO2: 19 mEq/L (ref 19–32)
CREATININE: 0.57 mg/dL (ref 0.50–1.10)
Chloride: 105 mEq/L (ref 96–112)
GFR, Est Non African American: >89 mL/min
GLUCOSE: 92 mg/dL (ref 70–99)
Potassium: 4.4 mEq/L (ref 3.5–5.3)
Sodium: 136 mEq/L (ref 135–145)
TOTAL PROTEIN: 6.7 g/dL (ref 6.0–8.3)
Total Bilirubin: 0.7 mg/dL (ref 0.2–1.2)

## 2014-09-20 LAB — LIPID PANEL
Cholesterol: 181 mg/dL (ref 0–200)
HDL: 57 mg/dL (ref >=46)
LDL CALC: 101 mg/dL — AB (ref 0–99)
Total CHOL/HDL Ratio: 3.2 Ratio
Triglycerides: 117 mg/dL (ref <150)
VLDL: 23 mg/dL (ref 0–40)

## 2014-09-20 LAB — TSH: TSH: 2.959 u[IU]/mL (ref 0.350–4.500)

## 2014-09-21 ENCOUNTER — Encounter: Payer: Self-pay | Admitting: Internal Medicine

## 2014-09-21 ENCOUNTER — Ambulatory Visit (INDEPENDENT_AMBULATORY_CARE_PROVIDER_SITE_OTHER): Payer: PRIVATE HEALTH INSURANCE | Admitting: Internal Medicine

## 2014-09-21 VITALS — BP 126/82 | HR 78 | Temp 98.5°F | Ht 68.0 in | Wt 220.0 lb

## 2014-09-21 DIAGNOSIS — G250 Essential tremor: Secondary | ICD-10-CM | POA: Diagnosis not present

## 2014-09-21 DIAGNOSIS — Z0000 Encounter for general adult medical examination without abnormal findings: Secondary | ICD-10-CM

## 2014-09-21 DIAGNOSIS — E559 Vitamin D deficiency, unspecified: Secondary | ICD-10-CM | POA: Diagnosis not present

## 2014-09-21 DIAGNOSIS — E039 Hypothyroidism, unspecified: Secondary | ICD-10-CM

## 2014-09-21 DIAGNOSIS — E785 Hyperlipidemia, unspecified: Secondary | ICD-10-CM | POA: Diagnosis not present

## 2014-09-21 DIAGNOSIS — F411 Generalized anxiety disorder: Secondary | ICD-10-CM | POA: Diagnosis not present

## 2014-09-21 LAB — POCT URINALYSIS DIPSTICK
BILIRUBIN UA: NEGATIVE
GLUCOSE UA: NEGATIVE
KETONES UA: NEGATIVE
LEUKOCYTES UA: NEGATIVE
Nitrite, UA: NEGATIVE
Protein, UA: NEGATIVE
RBC UA: NEGATIVE
SPEC GRAV UA: 1.01
UROBILINOGEN UA: NEGATIVE
pH, UA: 7

## 2014-09-21 LAB — HEMOGLOBIN A1C
Hgb A1c MFr Bld: 5.2 % (ref <5.7)
Mean Plasma Glucose: 103 mg/dL (ref <117)

## 2014-09-21 LAB — VITAMIN D 25 HYDROXY (VIT D DEFICIENCY, FRACTURES): VIT D 25 HYDROXY: 28 ng/mL — AB (ref 30–100)

## 2014-09-24 ENCOUNTER — Telehealth: Payer: Self-pay | Admitting: *Deleted

## 2014-09-24 NOTE — Telephone Encounter (Signed)
Patient called stated she knows you reviewed her TSH and told her it was OK but she would like to know if you would consider increasing her synthroid . She states she feels better and her weight is better when her TSH is lower . Please advise

## 2014-09-24 NOTE — Telephone Encounter (Signed)
Reviewed lab work with patient she states she knows you said her TSH was ok but she would like to know if you will increase her synthroid some she states she feels better and her weight is better with higher dose bring her TSH down below the 2. Mark. Please advise

## 2014-09-24 NOTE — Telephone Encounter (Signed)
It is not the cause of her weight gain. Her diet and lack of exercise is. It has not substantially changed. We can rechcek it in 3 months if she wants.

## 2014-09-25 NOTE — Telephone Encounter (Signed)
Spoke with patient reviewed diet and exercise plans. Patient verbalized understanding . Patient will call back to schedule appt for TSH lab for 3 months.

## 2014-10-13 NOTE — Progress Notes (Signed)
   Subjective:    Patient ID: Tricia Miranda, female    DOB: 20-Apr-1966, 49 y.o.   MRN: [id number]  HPI 49 year old Female in today for health maintenance exam and evaluation of medical issues. History of hypothyroidism, hyperlipidemia, anxiety, fibrocystic breast disease, a central tremor, migraine headaches, vitamin D deficiency, basal cell carcinoma left face 2009.  Past medical history:  Right comminuted humerus fracture October 2007. Left fibula fracture March 2009.  Tetanus immunization 2007.  While residing in [place] a few years ago, she had a fine tremor with some falls while on a hiking trip. A question was raised about multiple sclerosis. She had an MRI at that time. The only one I [facility] file was done in 2002 without gadolinium showing a punctate focus of T2 hypersensitivity in the superior left frontal white matter described as nonspecific. There was also a tiny focus of enhancement in the immediate pons probably representing capillary dilatation. She was evaluated by a neurologist at [facility] and subsequently went to see someone at [place] and had more testing. Also had nerve conduction testing. I do not have a copy of that report. She is anxious about her health and I think the question about possible multiple sclerosis  remains in her mind.    Review of Systems  Constitutional: Negative.   HENT: Negative.   Respiratory: Negative.   Cardiovascular: Negative.   Gastrointestinal: Negative.   Genitourinary: Negative.   Psychiatric/Behavioral:       Anxious       Objective:   Physical Exam  Constitutional: She is oriented to person, place, and time. She appears well-developed and well-nourished. No distress.  HENT:  Head: Normocephalic and atraumatic.  Right Ear: External ear normal.  Left Ear: External ear normal.  Mouth/Throat: Oropharynx is clear and moist. No oropharyngeal exudate.  Eyes: Conjunctivae and EOM are normal. Pupils  are equal, round, and reactive to light. Right eye exhibits no discharge. Left eye exhibits no discharge. No scleral icterus.  Neck: Neck supple. No JVD present. No thyromegaly present.  Cardiovascular: Normal rate, regular rhythm and normal heart sounds.   No murmur heard. Pulmonary/Chest: Effort normal and breath sounds normal. No respiratory distress. She has no wheezes. She has no rales.  Breasts normal female without masses  Abdominal: Bowel sounds are normal. She exhibits no distension and no mass. There is no tenderness. There is no rebound and no guarding.  Genitourinary:  Deferred to GYN  Lymphadenopathy:    She has no cervical adenopathy.  Neurological: She is alert and oriented to person, place, and time. She has normal reflexes. She displays normal reflexes. No cranial nerve deficit. Coordination normal.  Essential tremor  Skin: Skin is warm and dry. No rash noted. She is not diaphoretic.  Psychiatric: She has a normal mood and affect. Her behavior is normal. Judgment and thought content normal.  Vitals reviewed.         Assessment & Plan:  Essential tremor-stable without treatment  Hypothyroidism-stable on thyroid replacement therapy. Continue same dose  Anxiety-well treated with Xanax  Hyperlipidemia-stable on Crestor with normal lipid panel and liver functions  History of vitamin D deficiency-takes over-the-counter vitamin D supplementation. Vitamin D level slightly low. Needs to take on a regular basis.  Plan: Continue SSRI and Xanax. Continue Crestor. Return in 6 months for follow-up. Although most of her medical issues are stable, will continue every 6 month follow-up on anxiety and help answer her health questions.

## 2014-10-13 NOTE — Patient Instructions (Addendum)
Continue same medications and return in 6 months. Take vitamin D supplement regularly.

## 2014-11-21 ENCOUNTER — Other Ambulatory Visit: Payer: Self-pay | Admitting: Internal Medicine

## 2014-11-22 NOTE — Telephone Encounter (Signed)
Refill x 6 months 

## 2014-12-30 ENCOUNTER — Other Ambulatory Visit: Payer: Self-pay | Admitting: Internal Medicine

## 2015-01-04 ENCOUNTER — Ambulatory Visit: Payer: PRIVATE HEALTH INSURANCE | Admitting: Internal Medicine

## 2015-01-04 ENCOUNTER — Telehealth: Payer: Self-pay | Admitting: Internal Medicine

## 2015-01-04 NOTE — Telephone Encounter (Signed)
She was suppodsed to lose weight. Can weight until 6 month recheck

## 2015-01-04 NOTE — Telephone Encounter (Signed)
Patient cancelled appointment for this afternoon.  R/S for October.  She thought she needed fasting labs for lipid and thyroid.  Your note from her June CPE indicated to return in 6 months.  However, she was scheduled for a 3 month return for weight check TODAY with no labs to be drawn.  Patient didn't seem to no anything in this regard.  Your disposition doesn't indicate weight check for 3 months.  And, it doesn't indicate abnormal lipids.    Do you want her to have an appointment in October?  Or, do you want her to return in December for a 6 month return with fasting lipids.    Total cholesterol was 181 Triglycerides 181 LDL 101 HDL 57  Please advise and thanks.

## 2015-01-04 NOTE — Telephone Encounter (Signed)
Called patient, advised to do her weight check when she comes in for her 6 mh checkup.

## 2015-03-28 ENCOUNTER — Other Ambulatory Visit: Payer: PRIVATE HEALTH INSURANCE | Admitting: Internal Medicine

## 2015-03-28 DIAGNOSIS — E039 Hypothyroidism, unspecified: Secondary | ICD-10-CM

## 2015-03-28 LAB — TSH: TSH: 1.522 u[IU]/mL (ref 0.350–4.500)

## 2015-04-01 ENCOUNTER — Encounter: Payer: Self-pay | Admitting: Internal Medicine

## 2015-04-01 ENCOUNTER — Ambulatory Visit (INDEPENDENT_AMBULATORY_CARE_PROVIDER_SITE_OTHER): Payer: PRIVATE HEALTH INSURANCE | Admitting: Internal Medicine

## 2015-04-01 VITALS — BP 130/80 | HR 74 | Temp 98.5°F | Resp 20 | Ht 68.0 in | Wt 223.0 lb

## 2015-04-01 DIAGNOSIS — E669 Obesity, unspecified: Secondary | ICD-10-CM

## 2015-04-01 DIAGNOSIS — E785 Hyperlipidemia, unspecified: Secondary | ICD-10-CM | POA: Diagnosis not present

## 2015-04-01 DIAGNOSIS — F411 Generalized anxiety disorder: Secondary | ICD-10-CM

## 2015-04-01 DIAGNOSIS — E039 Hypothyroidism, unspecified: Secondary | ICD-10-CM | POA: Diagnosis not present

## 2015-04-01 MED ORDER — ROSUVASTATIN CALCIUM 5 MG PO TABS: ORAL_TABLET | ORAL | Status: DC

## 2015-04-01 MED ORDER — LEVOTHYROXINE SODIUM 100 MCG PO TABS: 100.0000 ug | ORAL_TABLET | Freq: Every day | ORAL | Status: DC

## 2015-04-20 NOTE — Patient Instructions (Addendum)
Please work on diet and exercise regimen over the next 6 months. Continue same medications. Physical exam booked for June 2017.

## 2015-04-20 NOTE — Progress Notes (Signed)
   Subjective:    Patient ID: Tricia Miranda, female    DOB: Sep 04, 1965, 49 y.o.   MRN: [id number]  HPI Patient in today for six-month recheck on anxiety, hyperlipidemia, hypothyroidism. Having difficulty losing weight. Spoke with her at length for over 25 minutes regarding diet history,exercise regimen. She hopes to join a gym after the first of the year. 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 Husband is a Engineer, materials. Tends to eat biggest meal at night. Goes out for lunch. Doesn't eat much breakfast if at all. May eat something very light at work for breakfast. Has healthy snacks such as yogurt.    Review of Systems     Objective:   Physical Exam  Skin warm and dry. Nodes none. No thyromegaly. Chest clear. Cardiac exam regular rate and rhythm. Extremities without edema.      Assessment & Plan:  Hypothyroidism-TSH within normal limits  Obesity  Hyperlipidemia-stable on generic Crestor  Anxiety-stable  Plan: Encouraged patient to join gym and watch diet. Reviewed in detail detail diet history. Likely eating too many calories at lunch and  dinner. Doesn't really eat breakfast. Not getting any exercise.

## 2015-05-23 ENCOUNTER — Encounter: Payer: Self-pay | Admitting: Internal Medicine

## 2015-05-23 ENCOUNTER — Ambulatory Visit (INDEPENDENT_AMBULATORY_CARE_PROVIDER_SITE_OTHER): Payer: No Typology Code available for payment source | Admitting: Internal Medicine

## 2015-05-23 VITALS — BP 148/92 | HR 88 | Temp 98.0°F | Resp 20 | Wt 222.0 lb

## 2015-05-23 DIAGNOSIS — J209 Acute bronchitis, unspecified: Secondary | ICD-10-CM | POA: Diagnosis not present

## 2015-05-23 MED ORDER — BENZONATATE 200 MG PO CAPS: 200.0000 mg | ORAL_CAPSULE | Freq: Three times a day (TID) | ORAL | Status: DC | PRN

## 2015-05-23 MED ORDER — ALBUTEROL SULFATE HFA 108 (90 BASE) MCG/ACT IN AERS: 2.0000 | INHALATION_SPRAY | Freq: Four times a day (QID) | RESPIRATORY_TRACT | Status: DC | PRN

## 2015-05-23 MED ORDER — LEVOFLOXACIN 500 MG PO TABS: 500.0000 mg | ORAL_TABLET | Freq: Every day | ORAL | Status: DC

## 2015-05-23 NOTE — Patient Instructions (Signed)
Use Ventolin inhaler 2 sprays po qid, Tessalon perles 3 times daily, Levaquin 500 mg daily with a meal.

## 2015-06-05 ENCOUNTER — Other Ambulatory Visit: Payer: Self-pay | Admitting: Internal Medicine

## 2015-06-18 NOTE — Progress Notes (Signed)
   Subjective:    Patient ID: Tricia Miranda, female    DOB: [birth year], 50 y.o.   MRN: [id number]  HPI  Three-week history of cough and congestion. Cannot seem to get better despite over-the-counter medications and some rest. Some discolored nasal drainage and sputum production. No flulike symptoms.    Review of Systems     Objective:   Physical Exam  Skin warm and dry. Nodes none. Pharynx slightly injected. TMs are clear. Neck supple. Chest clear to auscultation without rales  But has scattered inspiratory wheezing       Assessment & Plan:   Acute bronchitis  Plan: Ventolin inhaler 2 sprays by mouth 4 times daily as needed for coughing and wheezing. Tessalon Perles 200 mg 3 times daily as needed for cough. Levaquin 500 milligrams daily for 10 days with meals.

## 2015-06-25 ENCOUNTER — Other Ambulatory Visit: Payer: Self-pay | Admitting: Internal Medicine

## 2015-08-07 ENCOUNTER — Other Ambulatory Visit: Payer: Self-pay | Admitting: Internal Medicine

## 2015-09-20 ENCOUNTER — Telehealth: Payer: Self-pay | Admitting: Internal Medicine

## 2015-09-20 MED ORDER — LEVOTHYROXINE SODIUM 100 MCG PO TABS: 100.0000 ug | ORAL_TABLET | Freq: Every day | ORAL | Status: DC

## 2015-09-20 NOTE — Telephone Encounter (Signed)
Patient has upcoming CPE appointment. Refill Synthroid 0.1 mg #90 with no refill to [facility]

## 2015-09-23 ENCOUNTER — Encounter: Payer: Self-pay | Admitting: Internal Medicine

## 2015-09-23 ENCOUNTER — Ambulatory Visit (INDEPENDENT_AMBULATORY_CARE_PROVIDER_SITE_OTHER): Payer: No Typology Code available for payment source | Admitting: Internal Medicine

## 2015-09-23 ENCOUNTER — Ambulatory Visit
Admission: RE | Admit: 2015-09-23 | Discharge: 2015-09-23 | Disposition: A | Discharge status: Home / Self Care | Payer: PRIVATE HEALTH INSURANCE | Source: Ambulatory Visit | Attending: Internal Medicine | Admitting: Internal Medicine

## 2015-09-23 VITALS — BP 112/64 | HR 78 | Temp 98.3°F | Resp 18 | Wt 227.5 lb

## 2015-09-23 DIAGNOSIS — J209 Acute bronchitis, unspecified: Secondary | ICD-10-CM | POA: Diagnosis not present

## 2015-09-23 DIAGNOSIS — R059 Cough, unspecified: Secondary | ICD-10-CM

## 2015-09-23 DIAGNOSIS — R05 Cough: Secondary | ICD-10-CM

## 2015-09-23 MED ORDER — CEFTRIAXONE SODIUM 1 G IJ SOLR
1.0000 g | INTRAMUSCULAR | Status: AC
  Administered 2015-09-23: 1 g via INTRAMUSCULAR

## 2015-09-23 MED ORDER — LEVOFLOXACIN 500 MG PO TABS: 500.0000 mg | ORAL_TABLET | Freq: Every day | ORAL | Status: DC

## 2015-09-23 NOTE — Progress Notes (Signed)
   Subjective:    Patient ID: Tricia Miranda, female    DOB: 1965-08-02, 50 y.o.   MRN: [id number]  HPI 50 year old Female for evaluation cough, malaise and fatigue.Thinks she may have had low-grade fever. History of anxiety and hypothyroidism.    Review of Systems as above     Objective:   Physical Exam  Skin warm and dry. Parents are slightly injected. TMs slightly full bilaterally. Neck is supple without significant adenopathy. Chest: no frank rales but some coarse breath sounds in the bases       Assessment & Plan:  Acute bronchitis-chest x-ray negative for pneumonia  Bilateral serous otitis media  Plan: Rocephin 1 g IM. Levaquin 500 milligrams daily for 10 days. Rest and drink plenty of fluids.

## 2015-10-01 ENCOUNTER — Other Ambulatory Visit: Payer: PRIVATE HEALTH INSURANCE | Admitting: Internal Medicine

## 2015-10-04 ENCOUNTER — Encounter: Payer: PRIVATE HEALTH INSURANCE | Admitting: Internal Medicine

## 2015-10-15 NOTE — Patient Instructions (Signed)
Levaquin 500 milligrams daily for 10 days. 1 g IM Rocephin given today. Chest x-ray is negative for pneumonia.

## 2015-11-08 ENCOUNTER — Other Ambulatory Visit: Payer: No Typology Code available for payment source | Admitting: Internal Medicine

## 2015-11-08 DIAGNOSIS — Z0000 Encounter for general adult medical examination without abnormal findings: Secondary | ICD-10-CM

## 2015-11-08 DIAGNOSIS — E785 Hyperlipidemia, unspecified: Secondary | ICD-10-CM

## 2015-11-08 DIAGNOSIS — E039 Hypothyroidism, unspecified: Secondary | ICD-10-CM

## 2015-11-08 DIAGNOSIS — E559 Vitamin D deficiency, unspecified: Secondary | ICD-10-CM

## 2015-11-08 DIAGNOSIS — Z79899 Other long term (current) drug therapy: Secondary | ICD-10-CM

## 2015-11-08 DIAGNOSIS — Z130 Encounter for screening for diseases of the blood and blood-forming organs and certain disorders involving the immune mechanism: Secondary | ICD-10-CM

## 2015-11-08 LAB — CBC WITH DIFFERENTIAL/PLATELET
BASOS PCT: 1 %
Basophils Absolute: 69 cells/uL (ref 0–200)
EOS ABS: 207 {cells}/uL (ref 15–500)
Eosinophils Relative: 3 %
HCT: 37.4 % (ref 35.0–45.0)
HEMOGLOBIN: 12.3 g/dL (ref 11.7–15.5)
LYMPHS ABS: 2139 {cells}/uL (ref 850–3900)
Lymphocytes Relative: 31 %
MCH: 27.5 pg (ref 27.0–33.0)
MCHC: 32.9 g/dL (ref 32.0–36.0)
MCV: 83.5 fL (ref 80.0–100.0)
MONO ABS: 414 {cells}/uL (ref 200–950)
MONOS PCT: 6 %
MPV: 9.6 fL (ref 7.5–12.5)
NEUTROS ABS: 4071 {cells}/uL (ref 1500–7800)
Neutrophils Relative %: 59 %
PLATELETS: 293 10*3/uL (ref 140–400)
RBC: 4.48 MIL/uL (ref 3.80–5.10)
RDW: 14.3 % (ref 11.0–15.0)
WBC: 6.9 10*3/uL (ref 3.8–10.8)

## 2015-11-08 LAB — COMPLETE METABOLIC PANEL WITH GFR
ALBUMIN: 4.1 g/dL (ref 3.6–5.1)
ALT: 15 U/L (ref 6–29)
AST: 15 U/L (ref 10–35)
Alkaline Phosphatase: 72 U/L (ref 33–115)
BILIRUBIN TOTAL: 0.5 mg/dL (ref 0.2–1.2)
BUN: 11 mg/dL (ref 7–25)
CO2: 24 mmol/L (ref 20–31)
CREATININE: 0.62 mg/dL (ref 0.50–1.10)
Calcium: 9 mg/dL (ref 8.6–10.2)
Chloride: 102 mmol/L (ref 98–110)
GFR, Est Non African American: >89 mL/min (ref >=60)
GLUCOSE: 94 mg/dL (ref 65–99)
Potassium: 4.3 mmol/L (ref 3.5–5.3)
Sodium: 137 mmol/L (ref 135–146)
TOTAL PROTEIN: 6.5 g/dL (ref 6.1–8.1)

## 2015-11-08 LAB — LIPID PANEL
Cholesterol: 165 mg/dL (ref 125–200)
HDL: 59 mg/dL (ref >=46)
LDL CALC: 80 mg/dL (ref <130)
TRIGLYCERIDES: 128 mg/dL (ref <150)
Total CHOL/HDL Ratio: 2.8 Ratio (ref <=5.0)
VLDL: 26 mg/dL (ref <30)

## 2015-11-08 LAB — TSH: TSH: 5.49 m[IU]/L — AB

## 2015-11-09 LAB — VITAMIN D 25 HYDROXY (VIT D DEFICIENCY, FRACTURES): Vit D, 25-Hydroxy: 31 ng/mL (ref 30–100)

## 2015-11-12 ENCOUNTER — Encounter: Payer: Self-pay | Admitting: Internal Medicine

## 2015-11-12 ENCOUNTER — Ambulatory Visit (INDEPENDENT_AMBULATORY_CARE_PROVIDER_SITE_OTHER): Payer: No Typology Code available for payment source | Admitting: Internal Medicine

## 2015-11-12 VITALS — BP 126/76 | HR 73 | Temp 97.7°F | Ht 67.0 in | Wt 223.0 lb

## 2015-11-12 DIAGNOSIS — Z8669 Personal history of other diseases of the nervous system and sense organs: Secondary | ICD-10-CM

## 2015-11-12 DIAGNOSIS — E785 Hyperlipidemia, unspecified: Secondary | ICD-10-CM | POA: Diagnosis not present

## 2015-11-12 DIAGNOSIS — E039 Hypothyroidism, unspecified: Secondary | ICD-10-CM | POA: Diagnosis not present

## 2015-11-12 DIAGNOSIS — F411 Generalized anxiety disorder: Secondary | ICD-10-CM

## 2015-11-12 DIAGNOSIS — E559 Vitamin D deficiency, unspecified: Secondary | ICD-10-CM

## 2015-11-12 DIAGNOSIS — E669 Obesity, unspecified: Secondary | ICD-10-CM

## 2015-11-12 DIAGNOSIS — Z0000 Encounter for general adult medical examination without abnormal findings: Secondary | ICD-10-CM

## 2015-11-12 DIAGNOSIS — G250 Essential tremor: Secondary | ICD-10-CM

## 2015-11-12 DIAGNOSIS — D259 Leiomyoma of uterus, unspecified: Secondary | ICD-10-CM | POA: Diagnosis not present

## 2015-11-12 LAB — POCT URINALYSIS DIPSTICK
Bilirubin, UA: NEGATIVE
Glucose, UA: NEGATIVE
KETONES UA: NEGATIVE
Leukocytes, UA: NEGATIVE
Nitrite, UA: NEGATIVE
PH UA: 7
PROTEIN UA: NEGATIVE
SPEC GRAV UA: 1.01
Urobilinogen, UA: 0.2

## 2015-11-12 MED ORDER — LEVOTHYROXINE SODIUM 112 MCG PO TABS: 112.0000 ug | ORAL_TABLET | Freq: Every day | ORAL | 3 refills | Status: DC

## 2015-11-13 ENCOUNTER — Encounter: Payer: Self-pay | Admitting: Internal Medicine

## 2015-11-13 NOTE — Patient Instructions (Addendum)
It was a pleasure to see you today.   Thyroid replacement medication dosage has been changed. Follow-up in 6 weeks with TSH. Take 2000 units vitamin D 3 daily. Continue anti-anxiety medication. See GYN physician regarding uterine fibroid. Continue to work on diet exercise and weight loss.

## 2015-11-13 NOTE — Progress Notes (Signed)
Subjective:    Patient ID: Tricia Miranda, female    DOB: 27-Jan-1966, 50 y.o.   MRN: [id number]  HPI 50 year old Female in today for health maintenance exam and evaluation of medical issues including obesity, hypothyroidism, anxiety, vitamin D deficiency, basal cell carcinoma left face 2009, essential tremor, hyperlipidemia treated with statin medication, fibrocystic breast disease. Also history of migraine headaches.  Past medical history: Right comminuted humerus fracture October 2007. Left fibula fracture March 2009.  While residing in [place] several years ago, she developed a fine tremor. Question was raised about multiple sclerosis. She had an MRI of the brain at that time. This was approximately 2002 without gadolinium showing a punctate focus of T2 hypersensitivity in the superior left frontal white matter described as nonspecific. There was also a tiny focus of enhancement in the pons likely representing capillary dilatation. She saw neurologist at [facility] and also subsequently went to see someone at the [facility] and had more testing. She also had nerve conduction testing. Do not have a copy of that report. She is anxious about her health but I do not think these issues are significant at this point in time. She's had no further symptoms.  Being followed by GYN for uterine fibroid. Also GYN has suggested oral contraceptives for menorrhagia.  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 One son. Patient does not smoke. Social alcohol consumption.  Family history: Father with history of heart attack and alcoholism. Mother in good health. One sister with history of hip dysplasia. Paternal grandmother with adult onset diabetes mellitus.    Review of Systems see above     Objective:   Physical Exam  Constitutional: She is oriented to person, place,  and time. She appears well-developed and well-nourished. No distress.  HENT:  Head: Normocephalic and atraumatic.  Right Ear: External ear normal.  Left Ear: External ear normal.  Mouth/Throat: Oropharynx is clear and moist. No oropharyngeal exudate.  Eyes: Conjunctivae and EOM are normal. Pupils are equal, round, and reactive to light. Right eye exhibits no discharge. Left eye exhibits no discharge. No scleral icterus.  Neck: Neck supple. No JVD present. No thyromegaly present.  Cardiovascular: Normal rate, regular rhythm, normal heart sounds and intact distal pulses.   No murmur heard. Pulmonary/Chest: Effort normal. No respiratory distress. She has no wheezes. She has no rales.  Abdominal: Soft. Bowel sounds are normal. She exhibits no distension and no mass. There is no tenderness. There is no rebound and no guarding.  Genitourinary:  Genitourinary Comments: Deferred to GYN  Musculoskeletal: She exhibits no edema.  Lymphadenopathy:    She has no cervical adenopathy.  Neurological: She is alert and oriented to person, place, and time. She has normal reflexes. No cranial nerve deficit. Coordination normal.  Skin: Skin is warm and dry. No rash noted. She is not diaphoretic.  Psychiatric: She has a normal mood and affect. Judgment and thought content normal.  Vitals reviewed.         Assessment & Plan:  Hyperlipidemia-stable on low-dose Crestor  Hypothyroidism-increase dose to 0.112 mg daily and follow-up in 6 weeks with TSH without office visit. No clear explanation why her TSH is elevated at this point in time with the exception of she may be eating too soon after taking the medication.  Anxiety  History of vitamin D deficiency  Essential tremor  History of migraine headaches  Uterine fibroid  Menorrhagia  Obesity-continue to encourage her to work on diet exercise  Plan: Follow-up with TSH on the dose of thyroid replacement in 6 weeks.

## 2015-11-13 NOTE — Progress Notes (Signed)
   Subjective:    Patient ID: Tricia Miranda, female    DOB: Oct 04, 1965, 50 y.o.   MRN: [id number]  Anxiety     Hyperlipidemia       Review of Systems     Objective:   Physical Exam        Assessment & Plan:

## 2016-01-02 ENCOUNTER — Other Ambulatory Visit: Payer: No Typology Code available for payment source | Admitting: Internal Medicine

## 2016-01-07 ENCOUNTER — Other Ambulatory Visit: Payer: No Typology Code available for payment source | Admitting: Internal Medicine

## 2016-01-07 DIAGNOSIS — E039 Hypothyroidism, unspecified: Secondary | ICD-10-CM

## 2016-01-07 DIAGNOSIS — Z0000 Encounter for general adult medical examination without abnormal findings: Secondary | ICD-10-CM

## 2016-01-07 LAB — TSH: TSH: 1.01 mIU/L

## 2016-01-07 NOTE — Progress Notes (Signed)
TSH done without OV

## 2016-01-09 ENCOUNTER — Other Ambulatory Visit: Payer: Self-pay | Admitting: Internal Medicine

## 2016-01-30 ENCOUNTER — Telehealth: Payer: Self-pay | Admitting: Internal Medicine

## 2016-01-30 NOTE — Telephone Encounter (Signed)
Verbal order by Dr. Renold Genta to refill Alprazolam 0.25 mg.  Take 1 tablet by mouth twice daily as needed.  Disp #60, 5 refills.  Called to [facility] @ [phone] and left voicemail on pharmacy line.

## 2016-05-14 ENCOUNTER — Encounter: Payer: Self-pay | Admitting: Internal Medicine

## 2016-05-14 ENCOUNTER — Ambulatory Visit
Admission: RE | Admit: 2016-05-14 | Discharge: 2016-05-14 | Disposition: A | Discharge status: Home / Self Care | Payer: PRIVATE HEALTH INSURANCE | Source: Ambulatory Visit | Attending: Internal Medicine | Admitting: Internal Medicine

## 2016-05-14 ENCOUNTER — Other Ambulatory Visit: Payer: Self-pay | Admitting: Internal Medicine

## 2016-05-14 ENCOUNTER — Ambulatory Visit (INDEPENDENT_AMBULATORY_CARE_PROVIDER_SITE_OTHER): Payer: No Typology Code available for payment source | Admitting: Internal Medicine

## 2016-05-14 VITALS — BP 100/80 | HR 77 | Temp 98.3°F | Wt 222.0 lb

## 2016-05-14 DIAGNOSIS — R05 Cough: Secondary | ICD-10-CM

## 2016-05-14 DIAGNOSIS — R059 Cough, unspecified: Secondary | ICD-10-CM

## 2016-05-14 DIAGNOSIS — J111 Influenza due to unidentified influenza virus with other respiratory manifestations: Secondary | ICD-10-CM

## 2016-05-14 MED ORDER — AZITHROMYCIN 250 MG PO TABS: ORAL_TABLET | ORAL | 0 refills | Status: DC

## 2016-05-14 NOTE — Patient Instructions (Addendum)
Tessalon Perles as needed for cough. Rest and drink plenty of fluids. Tylenol for fever.Eat lightly if nauseated. Tamiflu as directed. Zithromax Z-PAK take 2 tablets day one followed by 1 tablet days 2 through 5

## 2016-05-14 NOTE — Telephone Encounter (Signed)
Was e-scribed earlier today

## 2016-05-14 NOTE — Progress Notes (Signed)
   Subjective:    Patient ID: Tricia Miranda, female    DOB: July 20, 1965, 51 y.o.   MRN: [id number]  HPI  Pt did not have flu vaccine this year. Has come down yesterday with fever, chills, myalgias, and some thoracic back pain. Felt slightly nauseated. Has had some cough ongoing since around January 7. Very little sputum production.    Review of Systems     Objective:   Physical Exam She looks fatigued. Decreased energy. TMs are clear. Pharynx is clear. Rapid flu test pending. Neck is supple. No significant adenopathy. Decreased breath sounds both lower lobes       Assessment & Plan:  Probable influenza-consider pneumonia  Plan: Tamiflu 75 mg twice daily for 5 days. Chest x-ray today. Zithromax Z-PAK take 2 tablets day one followed by 1 tablet days 2 through 5. Tessalon Perles as needed for cough.Rest and drink plenty of fluids.  Addendum: Rapid flu test is negative-patient informed. Chest x-ray is negative.

## 2016-05-15 ENCOUNTER — Telehealth: Payer: Self-pay | Admitting: Internal Medicine

## 2016-05-15 LAB — INFLUENZA A AND B AG, IMMUNOASSAY
Influenza A Antigen: NOT DETECTED
Influenza B Antigen: NOT DETECTED

## 2016-05-15 MED ORDER — OSELTAMIVIR PHOSPHATE 75 MG PO CAPS: 75.0000 mg | ORAL_CAPSULE | Freq: Two times a day (BID) | ORAL | 0 refills | Status: DC

## 2016-05-15 NOTE — Telephone Encounter (Signed)
Flu test is negative. She has a Z-Pak. She does not have pneumonia. Called in Tamiflu today.

## 2016-07-28 ENCOUNTER — Other Ambulatory Visit: Payer: Self-pay | Admitting: Internal Medicine

## 2016-08-13 ENCOUNTER — Other Ambulatory Visit: Payer: Self-pay

## 2016-10-22 ENCOUNTER — Other Ambulatory Visit: Payer: Self-pay | Admitting: Internal Medicine

## 2016-10-22 NOTE — Telephone Encounter (Signed)
CPE due after July 25. Please call her and refill until CPE

## 2016-11-03 NOTE — Telephone Encounter (Signed)
Spoke with patient and scheduled CPE for Monday, 10/1 @ 2pm.  CPE Labs are 9/27 @ 9:00 a.m.  Called Rx's to patient's pharmacy.

## 2016-11-15 ENCOUNTER — Other Ambulatory Visit: Payer: Self-pay | Admitting: Internal Medicine

## 2016-11-19 ENCOUNTER — Telehealth: Payer: Self-pay

## 2016-11-19 MED ORDER — SYNTHROID 112 MCG PO TABS: 112.0000 ug | ORAL_TABLET | Freq: Every day | ORAL | 0 refills | Status: DC

## 2016-11-19 NOTE — Telephone Encounter (Signed)
Resent as name brand because of pt preference

## 2017-01-14 ENCOUNTER — Other Ambulatory Visit: Payer: No Typology Code available for payment source | Admitting: Internal Medicine

## 2017-01-15 ENCOUNTER — Other Ambulatory Visit: Payer: No Typology Code available for payment source | Admitting: Internal Medicine

## 2017-01-15 DIAGNOSIS — Z8639 Personal history of other endocrine, nutritional and metabolic disease: Secondary | ICD-10-CM

## 2017-01-15 DIAGNOSIS — E669 Obesity, unspecified: Secondary | ICD-10-CM

## 2017-01-15 DIAGNOSIS — F411 Generalized anxiety disorder: Secondary | ICD-10-CM

## 2017-01-15 DIAGNOSIS — E785 Hyperlipidemia, unspecified: Secondary | ICD-10-CM

## 2017-01-15 DIAGNOSIS — E039 Hypothyroidism, unspecified: Secondary | ICD-10-CM

## 2017-01-15 DIAGNOSIS — Z0000 Encounter for general adult medical examination without abnormal findings: Secondary | ICD-10-CM

## 2017-01-18 ENCOUNTER — Encounter: Payer: Self-pay | Admitting: Internal Medicine

## 2017-01-18 ENCOUNTER — Ambulatory Visit (INDEPENDENT_AMBULATORY_CARE_PROVIDER_SITE_OTHER): Payer: No Typology Code available for payment source | Admitting: Internal Medicine

## 2017-01-18 ENCOUNTER — Telehealth: Payer: Self-pay | Admitting: *Deleted

## 2017-01-18 ENCOUNTER — Encounter: Payer: Self-pay | Admitting: Gastroenterology

## 2017-01-18 VITALS — BP 110/64 | HR 84 | Temp 98.2°F | Ht 66.25 in | Wt 232.0 lb

## 2017-01-18 DIAGNOSIS — E559 Vitamin D deficiency, unspecified: Secondary | ICD-10-CM

## 2017-01-18 DIAGNOSIS — D259 Leiomyoma of uterus, unspecified: Secondary | ICD-10-CM

## 2017-01-18 DIAGNOSIS — Z0000 Encounter for general adult medical examination without abnormal findings: Secondary | ICD-10-CM

## 2017-01-18 DIAGNOSIS — F419 Anxiety disorder, unspecified: Secondary | ICD-10-CM

## 2017-01-18 DIAGNOSIS — Z23 Encounter for immunization: Secondary | ICD-10-CM

## 2017-01-18 DIAGNOSIS — Z1211 Encounter for screening for malignant neoplasm of colon: Secondary | ICD-10-CM

## 2017-01-18 DIAGNOSIS — Z8669 Personal history of other diseases of the nervous system and sense organs: Secondary | ICD-10-CM

## 2017-01-18 DIAGNOSIS — R718 Other abnormality of red blood cells: Secondary | ICD-10-CM

## 2017-01-18 DIAGNOSIS — Z6837 Body mass index (BMI) 37.0-37.9, adult: Secondary | ICD-10-CM

## 2017-01-18 DIAGNOSIS — N920 Excessive and frequent menstruation with regular cycle: Secondary | ICD-10-CM

## 2017-01-18 DIAGNOSIS — G250 Essential tremor: Secondary | ICD-10-CM

## 2017-01-18 DIAGNOSIS — E039 Hypothyroidism, unspecified: Secondary | ICD-10-CM

## 2017-01-18 DIAGNOSIS — E785 Hyperlipidemia, unspecified: Secondary | ICD-10-CM

## 2017-01-18 LAB — IRON,TIBC AND FERRITIN PANEL
%SAT: 10 % (calc) — ABNORMAL LOW (ref 11–50)
Ferritin: 6 ng/mL — ABNORMAL LOW (ref 10–232)
Iron: 47 ug/dL (ref 45–160)
TIBC: 448 ug/dL (ref 250–450)

## 2017-01-18 LAB — LIPID PANEL
Cholesterol: 185 mg/dL (ref <200)
HDL: 56 mg/dL (ref >50)
LDL CHOLESTEROL (CALC): 109 mg/dL — AB
NON-HDL CHOLESTEROL (CALC): 129 mg/dL (ref <130)
TRIGLYCERIDES: 104 mg/dL (ref <150)
Total CHOL/HDL Ratio: 3.3 (calc) (ref <5.0)

## 2017-01-18 LAB — COMPLETE METABOLIC PANEL WITH GFR
AG Ratio: 1.6 (calc) (ref 1.0–2.5)
ALT: 13 U/L (ref 6–29)
AST: 11 U/L (ref 10–35)
Albumin: 4.2 g/dL (ref 3.6–5.1)
Alkaline phosphatase (APISO): 86 U/L (ref 33–130)
BUN: 15 mg/dL (ref 7–25)
CALCIUM: 9.1 mg/dL (ref 8.6–10.4)
CO2: 27 mmol/L (ref 20–32)
Chloride: 105 mmol/L (ref 98–110)
Creat: 0.68 mg/dL (ref 0.50–1.05)
GFR, EST AFRICAN AMERICAN: 117 mL/min/{1.73_m2} (ref >=60)
GFR, EST NON AFRICAN AMERICAN: 101 mL/min/{1.73_m2} (ref >=60)
GLUCOSE: 92 mg/dL (ref 65–99)
Globulin: 2.7 g/dL (calc) (ref 1.9–3.7)
Potassium: 4.6 mmol/L (ref 3.5–5.3)
Sodium: 140 mmol/L (ref 135–146)
TOTAL PROTEIN: 6.9 g/dL (ref 6.1–8.1)
Total Bilirubin: 0.4 mg/dL (ref 0.2–1.2)

## 2017-01-18 LAB — POCT URINALYSIS DIPSTICK
Bilirubin, UA: NEGATIVE
GLUCOSE UA: NEGATIVE
KETONES UA: NEGATIVE
Leukocytes, UA: NEGATIVE
Nitrite, UA: NEGATIVE
PROTEIN UA: NEGATIVE
SPEC GRAV UA: 1.01 (ref 1.010–1.025)
UROBILINOGEN UA: 0.2 U/dL
pH, UA: 6 (ref 5.0–8.0)

## 2017-01-18 LAB — CBC WITH DIFFERENTIAL/PLATELET
BASOS PCT: 1 %
Basophils Absolute: 60 cells/uL (ref 0–200)
Eosinophils Absolute: 180 cells/uL (ref 15–500)
Eosinophils Relative: 3 %
HCT: 36.8 % (ref 35.0–45.0)
Hemoglobin: 11.7 g/dL (ref 11.7–15.5)
Lymphs Abs: 2274 cells/uL (ref 850–3900)
MCH: 25 pg — ABNORMAL LOW (ref 27.0–33.0)
MCHC: 31.8 g/dL — ABNORMAL LOW (ref 32.0–36.0)
MCV: 78.6 fL — ABNORMAL LOW (ref 80.0–100.0)
MONOS PCT: 7.2 %
MPV: 9.8 fL (ref 7.5–12.5)
Neutro Abs: 3054 cells/uL (ref 1500–7800)
Neutrophils Relative %: 50.9 %
PLATELETS: 311 10*3/uL (ref 140–400)
RBC: 4.68 10*6/uL (ref 3.80–5.10)
RDW: 14.3 % (ref 11.0–15.0)
TOTAL LYMPHOCYTE: 37.9 %
WBC: 6 10*3/uL (ref 3.8–10.8)
WBCMIX: 432 {cells}/uL (ref 200–950)

## 2017-01-18 LAB — TEST AUTHORIZATION

## 2017-01-18 LAB — TSH: TSH: 1.55 m[IU]/L

## 2017-01-18 LAB — VITAMIN D 25 HYDROXY (VIT D DEFICIENCY, FRACTURES): Vit D, 25-Hydroxy: 23 ng/mL — ABNORMAL LOW (ref 30–100)

## 2017-01-18 MED ORDER — ROSUVASTATIN CALCIUM 5 MG PO TABS: ORAL_TABLET | ORAL | 3 refills | Status: DC

## 2017-01-18 MED ORDER — FLUOXETINE HCL 10 MG PO CAPS: 10.0000 mg | ORAL_CAPSULE | Freq: Every day | ORAL | 3 refills | Status: DC

## 2017-01-18 MED ORDER — ALPRAZOLAM 0.25 MG PO TABS: 0.2500 mg | ORAL_TABLET | Freq: Two times a day (BID) | ORAL | 5 refills | Status: DC | PRN

## 2017-01-18 MED ORDER — SYNTHROID 112 MCG PO TABS: 112.0000 ug | ORAL_TABLET | Freq: Every day | ORAL | 1 refills | Status: DC

## 2017-01-18 NOTE — Telephone Encounter (Signed)
Patient was seen at [facility] for annual on 07/08/16 was told to return to [facility] for pelvic ultrasound in 08/2016 for uterine myoma, will placed order front desk will schedule.

## 2017-01-19 LAB — CBC WITH DIFFERENTIAL/PLATELET

## 2017-01-19 LAB — TSH

## 2017-01-19 LAB — COMPLETE METABOLIC PANEL WITH GFR

## 2017-01-19 LAB — LIPID PANEL

## 2017-01-19 LAB — VITAMIN D 25 HYDROXY (VIT D DEFICIENCY, FRACTURES)

## 2017-02-03 ENCOUNTER — Ambulatory Visit (INDEPENDENT_AMBULATORY_CARE_PROVIDER_SITE_OTHER): Payer: No Typology Code available for payment source

## 2017-02-03 ENCOUNTER — Ambulatory Visit (INDEPENDENT_AMBULATORY_CARE_PROVIDER_SITE_OTHER): Payer: No Typology Code available for payment source | Admitting: Obstetrics & Gynecology

## 2017-02-03 ENCOUNTER — Other Ambulatory Visit: Payer: Self-pay | Admitting: Obstetrics & Gynecology

## 2017-02-03 ENCOUNTER — Telehealth: Payer: Self-pay

## 2017-02-03 ENCOUNTER — Encounter: Payer: Self-pay | Admitting: Obstetrics & Gynecology

## 2017-02-03 VITALS — BP 132/86

## 2017-02-03 DIAGNOSIS — D251 Intramural leiomyoma of uterus: Secondary | ICD-10-CM | POA: Diagnosis not present

## 2017-02-03 DIAGNOSIS — D252 Subserosal leiomyoma of uterus: Secondary | ICD-10-CM

## 2017-02-03 DIAGNOSIS — R102 Pelvic and perineal pain: Secondary | ICD-10-CM

## 2017-02-03 DIAGNOSIS — D259 Leiomyoma of uterus, unspecified: Secondary | ICD-10-CM

## 2017-02-03 DIAGNOSIS — N920 Excessive and frequent menstruation with regular cycle: Secondary | ICD-10-CM

## 2017-02-03 NOTE — Telephone Encounter (Signed)
I called patient and left message that I was calling about scheduling. I have checked her ins benefits and wanted to discuss that with her. I, also, let her know that my only available date for this surgery between now and first of the year is 03/23/17 at 7:30am. I asked her to call me back and let me know if that date works for her and we can discuss ins benefits.

## 2017-02-03 NOTE — Progress Notes (Signed)
    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  H/O Infertility/No contraception.  RP:  F/U Uterine Myomas with Menorrhagia/Metrorrhagia for Pelvic US  HPI:  Last visit at [place] 06/2016 with Pelvic US showing a large Uterine Myoma at 10.6 cm.  Patient had Menometrorrhagia and Tricia Miranda was prescribed, but side effects on it, so stopped.  LMP 12/21/2016 heavy flow.  Feels fatigues with decreased energy and pressure/pain in pelvis with back pain.  On Ferrous Sulfate.  Hb 11.7 on 01/15/2017.  On Fluoxetine and Xanax.  Past medical history,surgical history, problem list, medications, allergies, family history and social history were all reviewed and documented in the EPIC chart.  Directed ROS with pertinent positives and negatives documented in the history of present illness/assessment and plan.  Exam:  Vitals:   02/03/17 1025  BP: 132/86   General appearance:  Normal  Abdomen:  Fundus of Uterus just below the umbilicus.  Wide Fibroid Uterus.  Gyn exam:  Vulva normal.  Bimanual exam:  Very large irregular Fibroid uterus with width filling up the whole lower abdomen.  Limited mobility because of size.  Pelvic US today:  T/A and T/V images:  Enlarged uterus at 16.76 x 16.08 x 10.53 cm with a large uterine fibroid 9.8 x 7.9 x 11.1 cm.  Endometrium difficult to evaluate, about 6.3 mm.  Left Ovary appears normal.  Right ovarian cyst 1.9 x 1.8 x 2.0 cm, possible resolving Corpus Luteum Cyst, CFD pos at periphery, high echogenicity.  No FF in CDS.  Assessment/Plan:  51 y.o.  1. Menorrhagia with regular cycle Continue FeSO4 until surgery.  2. Intramural and subserous leiomyoma of uterus Very large symptomatic Uterine Myomas.  Decision to proceed with Hysterectomy.  Recommend TAH/Bilateral Salpingectomy.  Pfannenstiel incision.  Surgery and risks reviewed.  F/U Preop visit.  Patient understands and agrees.  3. Pelvic pain in female As  above.  Counseling >50% x 15 minutes.  Tricia Bruins MD, 11:04 AM 02/03/2017

## 2017-02-07 NOTE — Patient Instructions (Signed)
1. Menorrhagia with regular cycle Continue FeSO4 until surgery.  2. Intramural and subserous leiomyoma of uterus Very large symptomatic Uterine Myomas.  Decision to proceed with Hysterectomy.  Recommend TAH/Bilateral Salpingectomy.  Pfannenstiel incision.  Surgery and risks reviewed.  F/U Preop visit.  Patient understands and agrees.  3. Pelvic pain in female As above.  Tricia Miranda, it was a pleasure seeing you today!  I sent the message to organize your surgery.  I will see you again soon for the preop.   Total Abdominal Hysterectomy Abdominal hysterectomy is a surgical procedure to remove the womb (uterus). The uterus is the muscular organ that houses a developing baby. This surgery may be done if:  You have cancer.  You have growths (tumors or fibroids) in the uterus.  You have long-term (chronic) pain.  You are bleeding.  Your uterus has slipped down into your vagina (uterine prolapse).  You have a condition in which the tissue that lines the uterus grows outside of its normal location (endometriosis).  You have an infection in your uterus.  You are having problems with your menstrual cycle.  Depending on why you are having this procedure, you may also have other reproductive organs removed. These could include:  The part of your vagina that connects with your uterus (cervix).  The organs that make eggs (ovaries).  The tubes that connect the ovaries to the uterus (fallopian tubes).  Tell a health care provider about:  Any allergies you have.  All medicines you are taking, including vitamins, herbs, eye drops, creams, and over-the-counter medicines.  Any problems you or family members have had with anesthetic medicines.  Any blood disorders you have.  Any surgeries you have had.  Any medical conditions you have.  Whether you are pregnant or may be pregnant. What are the risks? Generally, this is a safe procedure. However, problems may occur,  including:  Bleeding.  Infection.  Allergic reactions to medicines or dyes.  Damage to other structures or organs.  Nerve injury.  Decreased interest in sex or pain during sex.  Blood clots that can break free and travel to your lungs.  What happens before the procedure? Staying hydrated Follow instructions from your health care provider about hydration, which may include:  Up to 2 hours before the procedure - you may continue to drink clear liquids, such as water, clear fruit juice, black coffee, and plain tea  Eating and drinking restrictions Follow instructions from your health care provider about eating and drinking, which may include:  8 hours before the procedure - stop eating heavy meals or foods such as meat, fried foods, or fatty foods.  6 hours before the procedure - stop eating light meals or foods, such as toast or cereal.  6 hours before the procedure - stop drinking milk or drinks that contain milk.  2 hours before the procedure - stop drinking clear liquids.  Medicines  Ask your health care provider about: ? Changing or stopping your regular medicines. This is especially important if you are taking diabetes medicines or blood thinners. ? Taking medicines such as aspirin and ibuprofen. These medicines can thin your blood. Do not take these medicines before your procedure if your health care provider instructs you not to.  You may be given antibiotic medicine to help prevent infection. Take it as told by your health care provider.  You may be asked to take laxatives to prevent constipation. General instructions  Ask your health care provider how your surgical site will  be marked or identified.  You may be asked to shower with a germ-killing soap.  Plan to have someone take you home from the hospital.  Do not use any products that contain nicotine or tobacco, such as cigarettes and e-cigarettes. If you need help quitting, ask your health care  provider.  You may have an exam or testing.  You may have a blood or urine sample taken.  You may need to have an enema to clean out your rectum and lower colon.  This procedure can affect the way you feel about yourself. Talk to your health care provider about the physical and emotional changes this procedure may cause. What happens during the procedure?  To lower your risk of infection: ? Your health care team will wash or sanitize their hands. ? Your skin will be washed with soap. ? Hair may be removed from the surgical area.  An IV tube will be inserted into one of your veins.  You will be given one or more of the following: ? A medicine to help you relax (sedative). ? A medicine to make you fall asleep (general anesthetic).  Tight-fitting (compression) stockings will be placed on your legs to promote circulation.  A thin, flexible tube (catheter) will be inserted to help drain your urine.  The surgeon will make a cut (incision) through the skin in your lower belly. The incision may go side-to-side or up-and-down.  The surgeon will move aside the body tissue that covers your uterus. The surgeon will then carefully take out your uterus along with any of the other organs that need to be removed.  Bleeding will be controlled with clamps or sutures.  The surgeon will close your incision with stitches (sutures), skin glue, or adhesive strips.  A bandage (dressing) will be placed over the incision. The procedure may vary among health care providers and hospitals. What happens after the procedure?  You will be given pain medicine as needed.  Your blood pressure, heart rate, breathing rate, and blood oxygen level will be monitored until the medicines you were given have worn off.  You will need to stay in the hospital to recover for one to two days. Ask your health care provider how long you will need to stay in the hospital after your procedure.  You may have a liquid diet at  first. You will most likely return to your usual diet the day after surgery.  You will still have the urinary catheter in place. It will likely be removed the day after surgery.  You may have to wear compression stockings. These stockings help to prevent blood clots and reduce swelling in your legs.  You will be encouraged to walk as soon as possible. You will also use a device or do breathing exercises to keep your lungs clear.  You may need to use a sanitary napkin for vaginal discharge. Summary  Abdominal hysterectomy is a surgical procedure to remove the womb (uterus). The uterus is the muscular organ that houses a developing baby.  This procedure can affect the way you feel about yourself. Talk to your health care provider about the physical and emotional changes this procedure may cause.  You will be given medicines for pain after the procedure.  You will need to stay in the hospital to recover. Ask your health care provider how long you will need to stay in the hospital after your procedure. This information is not intended to replace advice given to you by your health  care provider. Make sure you discuss any questions you have with your health care provider. Document Released: 04/11/2013 Document Revised: 03/25/2016 Document Reviewed: 03/25/2016 Elsevier Interactive Patient Education  2017 [employer].

## 2017-02-09 NOTE — Telephone Encounter (Signed)
Called patient again yesterday. We discussed her insurance benefits and estimated surgery prepymt and Dec 4 being only date available before first of year. She tells me she is considering waiting until first of year for insurance purposes and may be changing her ins plan. She wants to check on some things related to this and ask if I could hold Dec 4 for her this week so that she can take a look at those things and let me know. I will hold this time for her until next week. She will call me back.

## 2017-02-13 NOTE — Progress Notes (Signed)
Subjective:    Patient ID: Tricia Miranda, female    DOB: 1966/04/07, 51 y.o.   MRN: [id number]  HPI 51 year old White Female in today for health maintenance exam and evaluation of medical issues.  She has had some GYN problems recently with uterine fibroid and menorrhagia.  She  will be seeing GYN physician in the near future.  She says she has been asked to consider hysterectomy.  It sounds reasonable given her issues,  She has a history of obesity, hypothyroidism, anxiety, vitamin D deficiency, basal cell carcinoma left face in 2009, essential tremor, hyperlipidemia treated with statin medication, fibrocystic breast disease.  Also history of migraine headaches.  Past medical history: Right comminuted humerus fracture October 2007.  Left fibula fracture March 2009.  While residing in [place] a number of years ago she developed a fine tremor.  Question was raised about multiple sclerosis.  She had an MRI of the brain at that time.  There was a punctate focus of T2 hypersensitivity in the superior left frontal white matter described as nonspecific.  There was also a tiny focus of enhancement in the pons likely representing capillary dilatation.  She saw a neurologist at [facility] and subsequently went to see someone at the [facility] for more testing.  She had nerve conduction testing.  I do not have a copy of that report.  She has had no further symptoms related to this.  She is anxious about her health.  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  One son.  She does not smoke.  Social alcohol consumption.  Family history: Father with history of heart attack and alcoholism.  Mother in good health.  One sister with history of hip dysplasia.  Paternal grandmother with history of adult onset diabetes mellitus.    Review of Systems    Constitutional: Positive for fatigue.  Respiratory: Negative.   Cardiovascular: Negative.   Genitourinary:       Menorrhagia and lower abdominal pain  Skin: Negative.   Neurological:       History of fine tremor  Psychiatric/Behavioral:       History of anxiety       Objective:   Physical Exam  Constitutional: She is oriented to person, place, and time. She appears well-developed and well-nourished. No distress.  HENT:  Head: Normocephalic and atraumatic.  Right Ear: External ear normal.  Left Ear: External ear normal.  Mouth/Throat: Oropharynx is clear and moist.  Eyes: Pupils are equal, round, and reactive to light. Conjunctivae and EOM are normal. Right eye exhibits no discharge. Left eye exhibits no discharge.  Neck: Neck supple. No JVD present. No thyromegaly present.  Cardiovascular: Normal rate, regular rhythm and normal heart sounds.   No murmur heard. Pulmonary/Chest: No respiratory distress. She has no wheezes. She has no rales.  Abdominal: Soft. Bowel sounds are normal. She exhibits no distension. There is no rebound and no guarding.  Genitourinary:  Genitourinary Comments: Deferred to GYN  Musculoskeletal: She exhibits no edema.  Lymphadenopathy:    She has no cervical adenopathy.  Neurological: She is alert and oriented to person, place, and time. She has normal reflexes. No cranial nerve deficit. Coordination normal.  Very fine resting tremor  Skin: Skin is warm and dry. No rash noted. She is not diaphoretic.  Psychiatric: She has a normal mood and affect.  Her behavior is normal. Judgment and thought content normal.  Vitals reviewed.         Assessment & Plan:  Uterine fibroid with menorrhagia-needs to see GYN in the near future and she will have discussion regarding possible hysterectomy.  She is holding her hemoglobin at 11.7 g.  MCV is slightly low at 78.6 and she could benefit from iron supplementation.  Hypothyroidism-TSH stable -continue same dose  of thyroid replacement  Anxiety  History of vitamin D deficiency-level is 23-reminded to take 2000 units vitamin D3 daily  Essential tremor-stable without treatment  History of migraine headaches  Obesity-discussion regarding diet exercise and weight loss.  May want to consider Dr. Leafy Ro for consultation.  Hyperlipidemia-stable on Crestor.  LDL is 109  Plan: Follow-up in 6 months.  Need for screening colonoscopy discussed.  Continue same medications.  See GYN in the near future.  Take iron supplement.

## 2017-02-13 NOTE — Patient Instructions (Signed)
Take iron supplement.  Follow-up with GYN regarding menorrhagia uterine fibroid.  Continue same medications.  Need for screening colonoscopy discussed.  Follow-up in 6 months.

## 2017-03-08 ENCOUNTER — Other Ambulatory Visit: Payer: Self-pay

## 2017-03-08 ENCOUNTER — Ambulatory Visit (AMBULATORY_SURGERY_CENTER): Payer: Self-pay | Admitting: *Deleted

## 2017-03-08 VITALS — Ht 67.0 in | Wt 233.0 lb

## 2017-03-08 DIAGNOSIS — Z1211 Encounter for screening for malignant neoplasm of colon: Secondary | ICD-10-CM

## 2017-03-08 MED ORDER — NA SULFATE-K SULFATE-MG SULF 17.5-3.13-1.6 GM/177ML PO SOLN: ORAL | 0 refills | Status: DC

## 2017-03-08 NOTE — Progress Notes (Signed)
Patient denies any allergies to eggs or soy. Patient denies any problems with anesthesia/sedation. Patient denies any oxygen use at home. Patient denies taking any diet/weight loss medications or blood thinners. EMMI education assisgned to patient on colonoscopy, this was explained and instructions given to patient. 

## 2017-03-17 ENCOUNTER — Encounter: Payer: Self-pay | Admitting: Gastroenterology

## 2017-03-19 ENCOUNTER — Telehealth: Payer: Self-pay | Admitting: Internal Medicine

## 2017-03-19 NOTE — Telephone Encounter (Signed)
Pain is in right upper inner arm.  Near her armpit.  Denies heavy lifting or exertion.  She cannot get comfortable sleeping.  I guess is this is musculoskeletal pain.  Has been present for a couple of days.  No shortness of breath.  No palpable abnormality but some soreness to palpation.  Patient is to take over-the-counter analgesic medication.  Apply ice or heat.  See if symptoms will improve.  A telephone call to patient 3:07 PM. MJB

## 2017-03-19 NOTE — Telephone Encounter (Signed)
Patient called c/o of dull pain at the top of her arm, near her armpit. Patient states that she doesn't think it's muscular, there's no lumps nor bruises, and that it's been going on for the last 3-4 days. She's not sure if she needs an appt or not. Please advise.

## 2017-03-22 ENCOUNTER — Encounter: Payer: Self-pay | Admitting: Gastroenterology

## 2017-03-22 ENCOUNTER — Ambulatory Visit (AMBULATORY_SURGERY_CENTER): Payer: PRIVATE HEALTH INSURANCE | Admitting: Gastroenterology

## 2017-03-22 VITALS — BP 116/66 | HR 66 | Temp 98.4°F | Resp 18 | Ht 67.0 in | Wt 233.0 lb

## 2017-03-22 DIAGNOSIS — K6389 Other specified diseases of intestine: Secondary | ICD-10-CM

## 2017-03-22 DIAGNOSIS — D122 Benign neoplasm of ascending colon: Secondary | ICD-10-CM

## 2017-03-22 DIAGNOSIS — Z1211 Encounter for screening for malignant neoplasm of colon: Secondary | ICD-10-CM

## 2017-03-22 MED ORDER — SODIUM CHLORIDE 0.9 % IV SOLN
500.0000 mL | INTRAVENOUS | Status: DC
Start: 2017-03-22 — End: 2017-09-12

## 2017-03-22 NOTE — Progress Notes (Signed)
Called to room to assist during endoscopic procedure.  Patient ID and intended procedure confirmed with present staff. Received instructions for my participation in the procedure from the performing physician.  

## 2017-03-22 NOTE — Op Note (Signed)
[place] Patient Name: Tricia Miranda Procedure Date: 03/22/2017 11:40 AM MRN: [id number] Endoscopist: Remo Lipps P. Destane Speas MD, MD Age: 51 Referring MD:  Date of Birth: 01-Dec-1965 Gender: Female Account #: [id number] Procedure:                Colonoscopy Indications:              Screening for colorectal malignant neoplasm, This                            is the patient's first colonoscopy Medicines:                Monitored Anesthesia Care Procedure:                Pre-Anesthesia Assessment:                           - Prior to the procedure, a History and Physical                            was performed, and patient medications and                            allergies were reviewed. The patient's tolerance of                            previous anesthesia was also reviewed. The risks                            and benefits of the procedure and the sedation                            options and risks were discussed with the patient.                            All questions were answered, and informed consent                            was obtained. Prior Anticoagulants: The patient has                            taken no previous anticoagulant or antiplatelet                            agents. ASA Grade Assessment: II - A patient with                            mild systemic disease. After reviewing the risks                            and benefits, the patient was deemed in                            satisfactory condition to undergo the procedure.  After obtaining informed consent, the colonoscope                            was passed under direct vision. Throughout the                            procedure, the patient's blood pressure, pulse, and                            oxygen saturations were monitored continuously. The                            Colonoscope was introduced through the anus and                            advanced to the the  terminal ileum, with                            identification of the appendiceal orifice and IC                            valve. The colonoscopy was performed without                            difficulty. The patient tolerated the procedure                            well. The quality of the bowel preparation was                            good. The terminal ileum, ileocecal valve,                            appendiceal orifice, and rectum were photographed. Scope In: 11:48:30 AM Scope Out: 69:67:89 PM Scope Withdrawal Time: 0 hours 13 minutes 12 seconds  Total Procedure Duration: 0 hours 15 minutes 38 seconds  Findings:                 The perianal and digital rectal examinations were                            normal.                           The terminal ileum appeared normal.                           A 4 mm polyp was found in the ascending colon. The                            polyp was sessile. The polyp was removed with a                            cold snare. Resection and retrieval were complete.  The exam was otherwise without abnormality on                            direct and retroflexion views. Complications:            No immediate complications. Estimated blood loss:                            Minimal. Estimated Blood Loss:     Estimated blood loss was minimal. Impression:               - The examined portion of the ileum was normal.                           - One 4 mm polyp in the ascending colon, removed                            with a cold snare. Resected and retrieved.                           - The examination was otherwise normal on direct                            and retroflexion views. Recommendation:           - Patient has a contact number available for                            emergencies. The signs and symptoms of potential                            delayed complications were discussed with the                             patient. Return to normal activities tomorrow.                            Written discharge instructions were provided to the                            patient.                           - Resume previous diet.                           - Continue present medications.                           - Await pathology results.                           - Repeat colonoscopy is recommended for                            surveillance. The colonoscopy date will be  determined after pathology results from today's                            exam become available for review. Remo Lipps P. Rhian Funari MD, MD 03/22/2017 12:07:26 PM This report has been signed electronically.

## 2017-03-22 NOTE — Progress Notes (Signed)
A/ox3 pleased with MAC, report TO Judson Roch RN

## 2017-03-22 NOTE — Patient Instructions (Signed)
YOU HAD AN ENDOSCOPIC PROCEDURE TODAY AT THE [facility]:   Refer to the procedure report that was given to you for any specific questions about what was found during the examination.  If the procedure report does not answer your questions, please call your gastroenterologist to clarify.  If you requested that your care partner not be given the details of your procedure findings, then the procedure report has been included in a sealed envelope for you to review at your convenience later.  YOU SHOULD EXPECT: Some feelings of bloating in the abdomen. Passage of more gas than usual.  Walking can help get rid of the air that was put into your GI tract during the procedure and reduce the bloating. If you had a lower endoscopy (such as a colonoscopy or flexible sigmoidoscopy) you may notice spotting of blood in your stool or on the toilet paper. If you underwent a bowel prep for your procedure, you may not have a normal bowel movement for a few days.  Please Note:  You might notice some irritation and congestion in your nose or some drainage.  This is from the oxygen used during your procedure.  There is no need for concern and it should clear up in a day or so.  SYMPTOMS TO REPORT IMMEDIATELY:   Following lower endoscopy (colonoscopy or flexible sigmoidoscopy):  Excessive amounts of blood in the stool  Significant tenderness or worsening of abdominal pains  Swelling of the abdomen that is new, acute  Fever of 100F or higher  For urgent or emergent issues, a gastroenterologist can be reached at any hour by calling [phone].   DIET:  We do recommend a small meal at first, but then you may proceed to your regular diet.  Drink plenty of fluids but you should avoid alcoholic beverages for 24 hours.  MEDICATIONS: Continue present medications.  Please see handouts given to you by your recovery nurse.  ACTIVITY:  You should plan to take it easy for the rest of today and you should NOT  DRIVE or use heavy machinery until tomorrow (because of the sedation medicines used during the test).    FOLLOW UP: Our staff will call the number listed on your records the next business day following your procedure to check on you and address any questions or concerns that you may have regarding the information given to you following your procedure. If we do not reach you, we will leave a message.  However, if you are feeling well and you are not experiencing any problems, there is no need to return our call.  We will assume that you have returned to your regular daily activities without incident.  If any biopsies were taken you will be contacted by phone or by letter within the next 1-3 weeks.  Please call us at [phone] if you have not heard about the biopsies in 3 weeks.   Thank you for allowing us to provide for your healthcare needs today.   SIGNATURES/CONFIDENTIALITY: You and/or your care partner have signed paperwork which will be entered into your electronic medical record.  These signatures attest to the fact that that the information above on your After Visit Summary has been reviewed and is understood.  Full responsibility of the confidentiality of this discharge information lies with you and/or your care-partner. 

## 2017-03-22 NOTE — Progress Notes (Signed)
Pt's states no medical or surgical changes since previsit or office visit. 

## 2017-03-23 ENCOUNTER — Telehealth: Payer: Self-pay

## 2017-03-23 NOTE — Telephone Encounter (Signed)
  Follow up Call-  Call back number 03/22/2017  Post procedure Call Back phone  # #[phone] cell  Permission to leave phone message Yes  Some recent data might be hidden     Patient questions:  Do you have a fever, pain , or abdominal swelling? No. Pain Score  0 *  Have you tolerated food without any problems? Yes.    Have you been able to return to your normal activities? Yes.    Do you have any questions about your discharge instructions: Diet   No. Medications  No. Follow up visit  No.  Do you have questions or concerns about your Care? No.  Actions: * If pain score is 4 or above: No action needed, pain <4.

## 2017-03-27 ENCOUNTER — Encounter: Payer: Self-pay | Admitting: Gastroenterology

## 2017-07-09 ENCOUNTER — Other Ambulatory Visit: Payer: Self-pay | Admitting: Internal Medicine

## 2017-07-09 DIAGNOSIS — E039 Hypothyroidism, unspecified: Secondary | ICD-10-CM

## 2017-07-16 ENCOUNTER — Other Ambulatory Visit: Payer: No Typology Code available for payment source | Admitting: Internal Medicine

## 2017-07-20 ENCOUNTER — Ambulatory Visit: Payer: No Typology Code available for payment source | Admitting: Internal Medicine

## 2017-07-27 ENCOUNTER — Other Ambulatory Visit: Payer: Self-pay

## 2017-07-27 DIAGNOSIS — E039 Hypothyroidism, unspecified: Secondary | ICD-10-CM

## 2017-08-10 ENCOUNTER — Other Ambulatory Visit: Payer: Self-pay | Admitting: Internal Medicine

## 2017-08-13 ENCOUNTER — Other Ambulatory Visit: Payer: No Typology Code available for payment source | Admitting: Internal Medicine

## 2017-08-16 ENCOUNTER — Other Ambulatory Visit: Payer: No Typology Code available for payment source | Admitting: Internal Medicine

## 2017-08-16 DIAGNOSIS — R5383 Other fatigue: Secondary | ICD-10-CM

## 2017-08-16 DIAGNOSIS — E039 Hypothyroidism, unspecified: Secondary | ICD-10-CM

## 2017-08-16 LAB — TSH: TSH: 1.31 mIU/L

## 2017-08-16 NOTE — Addendum Note (Signed)
Addended by: Mady Haagensen on: 08/16/2017 12:42 PM   Modules accepted: Orders

## 2017-08-17 ENCOUNTER — Ambulatory Visit: Payer: No Typology Code available for payment source | Admitting: Internal Medicine

## 2017-08-17 LAB — VITAMIN B12: VITAMIN B 12: 292 pg/mL (ref 200–1100)

## 2017-08-19 ENCOUNTER — Other Ambulatory Visit: Payer: Self-pay

## 2017-08-20 ENCOUNTER — Encounter: Payer: Self-pay | Admitting: Internal Medicine

## 2017-08-20 ENCOUNTER — Telehealth: Payer: Self-pay | Admitting: *Deleted

## 2017-08-20 ENCOUNTER — Ambulatory Visit: Payer: No Typology Code available for payment source | Admitting: Internal Medicine

## 2017-08-20 VITALS — BP 130/88 | HR 88 | Ht 66.25 in | Wt 236.0 lb

## 2017-08-20 DIAGNOSIS — F419 Anxiety disorder, unspecified: Secondary | ICD-10-CM | POA: Diagnosis not present

## 2017-08-20 DIAGNOSIS — E039 Hypothyroidism, unspecified: Secondary | ICD-10-CM

## 2017-08-20 DIAGNOSIS — G250 Essential tremor: Secondary | ICD-10-CM

## 2017-08-20 DIAGNOSIS — Z6837 Body mass index (BMI) 37.0-37.9, adult: Secondary | ICD-10-CM | POA: Diagnosis not present

## 2017-08-20 DIAGNOSIS — Z8669 Personal history of other diseases of the nervous system and sense organs: Secondary | ICD-10-CM

## 2017-08-20 DIAGNOSIS — G4730 Sleep apnea, unspecified: Secondary | ICD-10-CM | POA: Diagnosis not present

## 2017-08-20 MED ORDER — ALPRAZOLAM 0.25 MG PO TABS: 0.2500 mg | ORAL_TABLET | Freq: Two times a day (BID) | ORAL | 5 refills | Status: DC | PRN

## 2017-08-20 MED ORDER — SYNTHROID 112 MCG PO TABS: ORAL_TABLET | ORAL | 11 refills | Status: DC

## 2017-08-20 NOTE — Telephone Encounter (Signed)
Patient called stating she has annual exam scheduled on 11/29/17. Spoke with Dr.Lavoie about surgery at [facility] location asked if okay to speak with her about this at annual and where to have mammogram screening done. I left on voicemail okay to wait to annual to discuss and the breast center number to call screening. I also told her to have her paper chart sent,as it has not arrived yet.

## 2017-09-08 ENCOUNTER — Other Ambulatory Visit: Payer: Self-pay

## 2017-09-12 ENCOUNTER — Encounter: Payer: Self-pay | Admitting: Internal Medicine

## 2017-09-12 NOTE — Patient Instructions (Addendum)
Referral to Pulmonology for possible sleep apnea evaluation.  Continue current medications as prescribed.  TSH is normal.  Consider referral to Dr. Leafy Ro for weight loss.  Information given to patient.  Return in 6 months for physical exam

## 2017-09-12 NOTE — Progress Notes (Signed)
   Subjective:    Patient ID: Tricia Miranda, female    DOB: [birth year], 52 y.o.   MRN: [id number]  HPI Patient in today for follow-up of hypothyroidism.  BMI remains elevated at 37.  Discussed once again diet exercise and weight loss.  She is asking for B12 level to be checked.  It is low normal at 292 and she may take oral B12 supplement.  She had colonoscopy in December with benign polyp being removed.  TSH is within normal limits at 1.31.    Review of Systems she is concerned about possible sleep apnea and would like referral to pulmonology     Objective:   Physical Exam Neck is supple without thyromegaly or adenopathy.  Chest clear to auscultation.  Cardiac exam regular rate and rhythm normal S1 and is 2.  Extremities without edema.  Blood pressure 130/88.  Continue to monitor.       Assessment & Plan:  BMI 37-discussion regarding diet exercise and weight loss  Low normal B12 level-take 1 mg vitamin B12 orally daily  Hypothyroidism-continue current dose of thyroid replacement medication  History of anxiety treated with Xanax and Prozac  History of migraine headaches-stable  History of essential tremor  History of allergic rhinitis and wheezing treated with albuterol inhaler-  ?  Sleep apnea-referral to Pulmonology  Plan: Return in 6 months or as needed.

## 2017-11-23 ENCOUNTER — Ambulatory Visit (INDEPENDENT_AMBULATORY_CARE_PROVIDER_SITE_OTHER): Payer: No Typology Code available for payment source | Admitting: Pulmonary Disease

## 2017-11-23 ENCOUNTER — Encounter: Payer: Self-pay | Admitting: Pulmonary Disease

## 2017-11-23 VITALS — BP 126/82 | HR 80 | Ht 66.25 in | Wt 227.0 lb

## 2017-11-23 DIAGNOSIS — R0683 Snoring: Secondary | ICD-10-CM | POA: Diagnosis not present

## 2017-11-23 DIAGNOSIS — F411 Generalized anxiety disorder: Secondary | ICD-10-CM | POA: Diagnosis not present

## 2017-11-23 NOTE — Progress Notes (Signed)
Subjective:    Patient ID: Tricia Miranda, female    DOB: September 27, 1965, 52 y.o.   MRN: [id number]  HPI  Chief Complaint  Patient presents with  . Sleep Consult    Referred by Dr. Renold Genta for possible OSA. Per patient, she has been told by her husband and son that she snores at night. Patient states she does not feel well rested after waking up.      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 Loud snoring has been noted by her husband who has recorded this.  She had a cold at the time of this recording and heard guttural noises from her throat.  She feels that her sister and mother also make the sounds in their sleep. She also reports nonrestorative sleep for many years.  She has gained about 20 pounds over the last few years and is started on a weight loss program. Epworth sleepiness score is 3 and she denies excessive daytime somnolence or fatigue.  She has always been a night owl. Bedtime is between 11 to 11:30 PM, sleep latency can be about 45 minutes to an hour, she reads in bed and may fall asleep as late as 12:30 AM.  She prefers to sleep on her side or on her stomach and avoid sleeping on her back.  She denies nocturia and occasionally has one nocturnal awakening and is up most weekdays at 7 AM with occasional dryness of mouth, dragging a little bit in the morning before she gets going.  She sleeps later on weekends.  No daytime naps, the occasional nap lasts for 1 to 2 hours and is not refreshing.  There is no history suggestive of cataplexy, sleep paralysis or parasomnias  She takes Prozac in the late evening and has been on this for many years    Past Medical History:  Diagnosis Date  . Anxiety   . Bronchitis   . Thyroid disease    hypothyroidism   Past Surgical History:  Procedure Laterality Date  . DILITATION & CURRETTAGE/HYSTROSCOPY WITH VERSAPOINT RESECTION N/A 03/16/2014   Procedure: DILATATION & CURETTAGE/HYSTEROSCOPY WITH VERSAPOINT  RESECTION;  Surgeon: [name], MD;  Location: [facility];  Service: Gynecology;  Laterality: N/A;  1 hr.  . WISDOM TOOTH EXTRACTION       No Known Allergies  Social History   Socioeconomic History  . Marital status: Married    Spouse name: Not on file  . Number of children: Not on file  . Years of education: Not on file  . Highest education level: Not on file  Occupational History  . Not on file  Social Needs  . Financial resource strain: Not on file  . Food insecurity:    Worry: Not on file    Inability: Not on file  . Transportation needs:    Medical: Not on file    Non-medical: Not on file  Tobacco Use  . Smoking status: Never Smoker  . Smokeless tobacco: Never Used  Substance and Sexual Activity  . Alcohol use: Yes    Alcohol/week: 1.8 oz    Types: 3 drink(s) per week    Comment: wine or beer  . Drug use: No  . Sexual activity: Yes  Lifestyle  . Physical activity:    Days per week: Not on file    Minutes per session: Not on file  . Stress: Not on file  Relationships  . Social connections:    Talks on phone: Not on file  Gets together: Not on file    Attends religious service: Not on file    Active member of club or organization: Not on file    Attends meetings of clubs or organizations: Not on file    Relationship status: Not on file  . Intimate partner violence:    Fear of current or ex partner: Not on file    Emotionally abused: Not on file    Physically abused: Not on file    Forced sexual activity: Not on file  Other Topics Concern  . Not on file  Social History Narrative  . Not on file     Family History  Problem Relation Age of Onset  . Heart disease Father   . Alcohol abuse Father   . Stomach cancer Other   . Colon cancer Neg Hx   . Esophageal cancer Neg Hx   . Pancreatic cancer Neg Hx   . Prostate cancer Neg Hx   . Rectal cancer Neg Hx      Review of Systems Positive for infrequent difficulty swallowing,  sneezing   Constitutional: negative for anorexia, fevers and sweats  Eyes: negative for irritation, redness and visual disturbance  Ears, nose, mouth, throat, and face: negative for earaches, epistaxis, nasal congestion and sore throat  Respiratory: negative for cough, dyspnea on exertion, sputum and wheezing  Cardiovascular: negative for chest pain, dyspnea, lower extremity edema, orthopnea, palpitations and syncope  Gastrointestinal: negative for abdominal pain, constipation, diarrhea, melena, nausea and vomiting  Genitourinary:negative for dysuria, frequency and hematuria  Hematologic/lymphatic: negative for bleeding, easy bruising and lymphadenopathy  Musculoskeletal:negative for arthralgias, muscle weakness and stiff joints  Neurological: negative for coordination problems, gait problems, headaches and weakness  Endocrine: negative for diabetic symptoms including polydipsia, polyuria and weight loss      Objective:   Physical Exam  Gen. Pleasant, well-nourished, in no distress, normal affect ENT - no lesions,class 2 airway, no post nasal drip Neck: No JVD, no thyromegaly, no carotid bruits Lungs: no use of accessory muscles, no dullness to percussion, clear without rales or rhonchi  Cardiovascular: Rhythm regular, heart sounds  normal, no murmurs or gallops, no peripheral edema Abdomen: soft and non-tender, no hepatosplenomegaly, BS normal. Musculoskeletal: No deformities, no cyanosis or clubbing Neuro:  alert, non focal       Assessment & Plan:

## 2017-11-23 NOTE — Patient Instructions (Signed)
Schedule home sleep study.   

## 2017-11-23 NOTE — Assessment & Plan Note (Signed)
Advised to take Prozac in the daytime

## 2017-11-23 NOTE — Assessment & Plan Note (Signed)
Given  narrow pharyngeal exam & loud snoring, obstructive sleep apnea is very likely & an overnight polysomnogram will be scheduled as a home study. The pathophysiology of obstructive sleep apnea , it's cardiovascular consequences & modes of treatment including CPAP were discused with the patient in detail & they evidenced understanding.  Pretest probability is intermediate.  We discussed treatment options, she would prefer a mouthguard if needed.  She uses a mouthguard already for bruxism

## 2017-11-29 ENCOUNTER — Encounter: Payer: Self-pay | Admitting: Obstetrics & Gynecology

## 2017-11-29 ENCOUNTER — Ambulatory Visit (INDEPENDENT_AMBULATORY_CARE_PROVIDER_SITE_OTHER): Payer: No Typology Code available for payment source | Admitting: Obstetrics & Gynecology

## 2017-11-29 VITALS — BP 126/84 | Ht 66.75 in | Wt 220.0 lb

## 2017-11-29 DIAGNOSIS — Z01419 Encounter for gynecological examination (general) (routine) without abnormal findings: Secondary | ICD-10-CM | POA: Diagnosis not present

## 2017-11-29 DIAGNOSIS — Z1151 Encounter for screening for human papillomavirus (HPV): Secondary | ICD-10-CM

## 2017-11-29 DIAGNOSIS — D219 Benign neoplasm of connective and other soft tissue, unspecified: Secondary | ICD-10-CM

## 2017-11-29 DIAGNOSIS — N914 Secondary oligomenorrhea: Secondary | ICD-10-CM | POA: Diagnosis not present

## 2017-11-29 MED ORDER — PROGESTERONE MICRONIZED 200 MG PO CAPS: 200.0000 mg | ORAL_CAPSULE | Freq: Every day | ORAL | 0 refills | Status: DC

## 2017-11-29 MED ORDER — NORETHINDRONE 0.35 MG PO TABS: 1.0000 | ORAL_TABLET | Freq: Every day | ORAL | 4 refills | Status: DC

## 2017-11-29 NOTE — Progress Notes (Signed)
Tricia Miranda 1965/07/01 [id number]   History:    52 y.o. G1P1L1 Married.  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  RP:  Established patient presenting for annual gyn exam   HPI: Stopped the Progestin only pill in 07/2017, because ran out of pills.  No menses since then, but had 1 episode of vaginal bleeding in 10/2017 after a vigorous physical activity.  No pelvic pain, except occasionally when in a semi-sitting position.  Urine/BMs wnl.  H/o Infertility.  Breasts wnl.  BMI 34.72.  Health labs with family physician.  Past medical history,surgical history, family history and social history were all reviewed and documented in the EPIC chart.  Gynecologic History Patient's last menstrual period was 07/30/2017. Contraception: History of infertility Last Pap: 2018. Results were: normal per patient Last mammogram: 2018. Results were: Normal per patient, at [facility]. Bone Density: Never Colonoscopy: 2018  Obstetric History OB History  Gravida Para Term Preterm AB Living  1 1       1   SAB TAB Ectopic Multiple Live Births               # Outcome Date GA Lbr Len/2nd Weight Sex Delivery Anes PTL Lv  1 Para              ROS: A ROS was performed and pertinent positives and negatives are included in the history.  GENERAL: No fevers or chills. HEENT: No change in vision, no earache, sore throat or sinus congestion. NECK: No pain or stiffness. CARDIOVASCULAR: No chest pain or pressure. No palpitations. PULMONARY: No shortness of breath, cough or wheeze. GASTROINTESTINAL: No abdominal pain, nausea, vomiting or diarrhea, melena or bright red blood per rectum. GENITOURINARY: No urinary frequency, urgency, hesitancy or dysuria. MUSCULOSKELETAL: No joint or muscle pain, no back pain, no recent trauma. DERMATOLOGIC: No rash, no itching, no lesions. ENDOCRINE: No polyuria, polydipsia, no heat or cold intolerance. No recent change in weight. HEMATOLOGICAL: No anemia or easy bruising or bleeding. NEUROLOGIC: No headache,  seizures, numbness, tingling or weakness. PSYCHIATRIC: No depression, no loss of interest in normal activity or change in sleep pattern.     Exam:   BP 126/84   Ht 5' 6.75" (1.695 m)   Wt 220 lb (99.8 kg)   LMP 07/30/2017   BMI 34.72 kg/m   Body mass index is 34.72 kg/m.  General appearance : Well developed well nourished female. No acute distress HEENT: Eyes: no retinal hemorrhage or exudates,  Neck supple, trachea midline, no carotid bruits, no thyroidmegaly Lungs: Clear to auscultation, no rhonchi or wheezes, or rib retractions  Heart: Regular rate and rhythm, no murmurs or gallops Breast:Examined in sitting and supine position were symmetrical in appearance, no palpable masses or tenderness,  no skin retraction, no nipple inversion, no nipple discharge, no skin discoloration, no axillary or supraclavicular lymphadenopathy Abdomen: no palpable masses or tenderness, no rebound or guarding Extremities: no edema or skin discoloration or tenderness  Pelvic: Vulva: Normal             Vagina: No gross lesions or discharge  Cervix: No gross lesions or discharge.  Pap/HPV HR done  Uterus  AV, increased size about 12 cm, nodular, hard, non-tender and mobile  Adnexa  Without masses or tenderness  Anus: normal   Assessment/Plan:  52 y.o. female for annual exam   1. Encounter for routine gynecological examination with Papanicolaou smear of cervix Gynecologic exam with stable enlarged uterus from fibroids.  Pap with high-risk HPV done  today.  Breast exam normal.  Last mammogram in 2018 at [facility].  Patient will schedule next screening mammogram at the breast center.  Colonoscopy done in 2018.  Health labs with family physician.  2. Secondary oligomenorrhea Probably perimenopausal.  We will check [place] today.  Will also take Prometrium 200 mg capsule at bedtime for 10 nights.  If withdrawal bleeding is present, will start on the progestin only pill to protect the endometrium. -  FSH  3. Fibroid Uterine fibroids stable per pelvic exam today compared to the last pelvic ultrasound in October 2018.  Just mildly symptomatic from the uterine fibroids currently and probably in perimenopause, decision therefore to observe.  Will repeat a pelvic ultrasound in 6 months. - US Transvaginal Non-OB; Future  Other orders - progesterone (PROMETRIUM) 200 MG capsule; Take 1 capsule (200 mg total) by mouth at bedtime for 10 days. - norethindrone (MICRONOR,[name],ERRIN) 0.35 MG tablet; Take 1 tablet (0.35 mg total) by mouth daily.  Counseling on above issues and coordination of care more than 50% for 10 minutes.  [name] MD, 3:45 PM 11/29/2017

## 2017-11-29 NOTE — Patient Instructions (Signed)
1. Encounter for routine gynecological examination with Papanicolaou smear of cervix Gynecologic exam with stable enlarged uterus from fibroids.  Pap with high-risk HPV done today.  Breast exam normal.  Last mammogram in 2018 at [facility].  Patient will schedule next screening mammogram at the breast center.  Colonoscopy done in 2018.  Health labs with family physician.  2. Secondary oligomenorrhea Probably perimenopausal.  We will check [place] today.  Will also take Prometrium 200 mg capsule at bedtime for 10 nights.  If withdrawal bleeding is present, will start on the progestin only pill to protect the endometrium. - FSH  3. Fibroid Uterine fibroids stable per pelvic exam today compared to the last pelvic ultrasound in October 2018.  Just mildly symptomatic from the uterine fibroids currently and probably in perimenopause, decision therefore to observe.  Will repeat a pelvic ultrasound in 6 months. - US Transvaginal Non-OB; Future  Other orders - progesterone (PROMETRIUM) 200 MG capsule; Take 1 capsule (200 mg total) by mouth at bedtime for 10 days. - norethindrone (MICRONOR,[name],ERRIN) 0.35 MG tablet; Take 1 tablet (0.35 mg total) by mouth daily.  Tricia Miranda, it was a pleasure seeing you today!  I will inform you of your results as soon as they are available.

## 2017-11-30 LAB — PAP, TP IMAGING W/ HPV RNA, RFLX HPV TYPE 16,18/45: HPV DNA HIGH RISK: NOT DETECTED

## 2017-11-30 LAB — FOLLICLE STIMULATING HORMONE: FSH: 13.2 m[IU]/mL

## 2017-12-13 ENCOUNTER — Other Ambulatory Visit: Payer: Self-pay | Admitting: Obstetrics & Gynecology

## 2017-12-26 DIAGNOSIS — G4733 Obstructive sleep apnea (adult) (pediatric): Secondary | ICD-10-CM

## 2017-12-27 ENCOUNTER — Other Ambulatory Visit: Payer: Self-pay | Admitting: *Deleted

## 2017-12-27 DIAGNOSIS — G4733 Obstructive sleep apnea (adult) (pediatric): Secondary | ICD-10-CM

## 2017-12-27 DIAGNOSIS — R0683 Snoring: Secondary | ICD-10-CM

## 2017-12-30 ENCOUNTER — Telehealth: Payer: Self-pay | Admitting: Pulmonary Disease

## 2017-12-30 DIAGNOSIS — R0683 Snoring: Secondary | ICD-10-CM

## 2017-12-30 NOTE — Telephone Encounter (Signed)
Attempted to call pt. I did not receive an answer. I have left a message for pt to return our call.  

## 2017-12-30 NOTE — Telephone Encounter (Signed)
Pt returning call. Pt contact number [phone]/[phone]

## 2017-12-30 NOTE — Telephone Encounter (Signed)
Dr. Ander Slade has reviewed the home sleep test this showed mild obstructive sleep apnea.   Recommendations   Treatment options are CPAP with the settings auto 5 to 15.    Weight loss measures .   Advise against driving while sleepy & against medication with sedative side effects.   Patient has been advised of these results and would like to call back to let us know if she would like to start therapy. I will leave message open until she calls back.

## 2018-01-03 NOTE — Telephone Encounter (Signed)
Spoke with patient, patient reports MD Elsworth Soho said she could have Dentist referral for oral appliance if HST showed mild sleep apnea. Patient states she would like to move forward with the cpap but she prefers the oral device.   RA please advise if okay to place order for oral device for cpap. Thanks.

## 2018-01-03 NOTE — Telephone Encounter (Signed)
OK for referral to dr Ron Parker or dentist of choice

## 2018-01-03 NOTE — Telephone Encounter (Signed)
Spoke with patient, made aware order has been sent for oral appliance. Voiced understanding. Nothing further needed at this time.

## 2018-01-03 NOTE — Telephone Encounter (Signed)
Pt is calling back [phone]

## 2018-01-26 ENCOUNTER — Other Ambulatory Visit: Payer: Self-pay | Admitting: Internal Medicine

## 2018-01-26 DIAGNOSIS — Z0000 Encounter for general adult medical examination without abnormal findings: Secondary | ICD-10-CM

## 2018-01-26 DIAGNOSIS — E039 Hypothyroidism, unspecified: Secondary | ICD-10-CM

## 2018-01-26 DIAGNOSIS — G250 Essential tremor: Secondary | ICD-10-CM

## 2018-01-26 DIAGNOSIS — E7800 Pure hypercholesterolemia, unspecified: Secondary | ICD-10-CM

## 2018-01-26 DIAGNOSIS — Z8669 Personal history of other diseases of the nervous system and sense organs: Secondary | ICD-10-CM

## 2018-01-26 DIAGNOSIS — F419 Anxiety disorder, unspecified: Secondary | ICD-10-CM

## 2018-01-26 DIAGNOSIS — E559 Vitamin D deficiency, unspecified: Secondary | ICD-10-CM

## 2018-01-26 DIAGNOSIS — N920 Excessive and frequent menstruation with regular cycle: Secondary | ICD-10-CM

## 2018-02-01 ENCOUNTER — Other Ambulatory Visit: Payer: No Typology Code available for payment source | Admitting: Internal Medicine

## 2018-02-03 ENCOUNTER — Other Ambulatory Visit: Payer: Self-pay | Admitting: Internal Medicine

## 2018-02-03 ENCOUNTER — Ambulatory Visit (INDEPENDENT_AMBULATORY_CARE_PROVIDER_SITE_OTHER): Payer: No Typology Code available for payment source | Admitting: Internal Medicine

## 2018-02-03 ENCOUNTER — Encounter: Payer: Self-pay | Admitting: Internal Medicine

## 2018-02-03 VITALS — BP 120/80 | HR 66 | Temp 99.0°F | Ht 66.75 in | Wt 210.0 lb

## 2018-02-03 DIAGNOSIS — J22 Unspecified acute lower respiratory infection: Secondary | ICD-10-CM | POA: Diagnosis not present

## 2018-02-03 DIAGNOSIS — J029 Acute pharyngitis, unspecified: Secondary | ICD-10-CM

## 2018-02-03 DIAGNOSIS — H6503 Acute serous otitis media, bilateral: Secondary | ICD-10-CM | POA: Diagnosis not present

## 2018-02-03 LAB — POCT RAPID STREP A (OFFICE): Rapid Strep A Screen: NEGATIVE

## 2018-02-03 MED ORDER — AZITHROMYCIN 250 MG PO TABS: ORAL_TABLET | ORAL | 0 refills | Status: DC

## 2018-02-03 MED ORDER — HYDROCODONE-HOMATROPINE 5-1.5 MG/5ML PO SYRP: 5.0000 mL | ORAL_SOLUTION | Freq: Three times a day (TID) | ORAL | 0 refills | Status: DC | PRN

## 2018-02-03 NOTE — Patient Instructions (Signed)
Zithromax Z pak 2 po day 1 followed by one po days 2-5. Hycodan one tsp po q 8 hours prn cough. Rest and drink plenty of fluids.

## 2018-02-03 NOTE — Progress Notes (Signed)
   Subjective:    Patient ID: Tricia Miranda, female    DOB: 03-Feb-1966, 52 y.o.   MRN: [id number]  HPI 52 year old Female with sore throat, fever up to 102 x 3 days, myalgias, cough with sputum production.    Review of Systems see above     Objective:   Physical Exam  HENT:  Mouth/Throat: No posterior oropharyngeal erythema. No tonsillar exudate.  TMs full and dull bilaterally  Neck is supple.  Chest clear to auscultation without rales or wheezing.  Blood pressure 120/80, temperature 36F.  Pulse 66.  Pulse oximetry 96%.  Looks fatigued.        Assessment & Plan:  Acute lower respiratory infection  Pharyngitis  Serous BOM  Plan: Zithromax Z pak 2 po day 1 followed by one po days 2-5.  Rest and drink plenty of fluids.  15 minutes spent with patient

## 2018-02-04 ENCOUNTER — Encounter: Payer: No Typology Code available for payment source | Admitting: Internal Medicine

## 2018-02-11 ENCOUNTER — Telehealth: Payer: Self-pay | Admitting: Internal Medicine

## 2018-02-11 NOTE — Telephone Encounter (Signed)
Patient stating she is still having a little muffle sound in one ear although she is feeling much much better.  A little sore throat left.  She finished Z pak on Tuesday.  Advised this will stay with her through Sunday.  She's not running any fever at all.  Just a little congestion has returning, but she's not really feeling bad.  Advised she could do some Sudafed and take some Delsym if she starts coughing.  Drink plenty of fluids to thin any secretions that may come up and call us back on Monday if she's not feeling better.    Patient advised understanding of our conversation.

## 2018-02-16 ENCOUNTER — Encounter: Payer: Self-pay | Admitting: Internal Medicine

## 2018-02-18 ENCOUNTER — Telehealth: Payer: Self-pay | Admitting: Internal Medicine

## 2018-02-18 ENCOUNTER — Encounter: Payer: Self-pay | Admitting: Internal Medicine

## 2018-02-18 ENCOUNTER — Ambulatory Visit (INDEPENDENT_AMBULATORY_CARE_PROVIDER_SITE_OTHER): Payer: No Typology Code available for payment source | Admitting: Internal Medicine

## 2018-02-18 VITALS — BP 110/80 | HR 88 | Temp 98.2°F | Ht 66.75 in | Wt 219.0 lb

## 2018-02-18 DIAGNOSIS — J0100 Acute maxillary sinusitis, unspecified: Secondary | ICD-10-CM

## 2018-02-18 DIAGNOSIS — H9203 Otalgia, bilateral: Secondary | ICD-10-CM | POA: Diagnosis not present

## 2018-02-18 DIAGNOSIS — Z23 Encounter for immunization: Secondary | ICD-10-CM | POA: Diagnosis not present

## 2018-02-18 MED ORDER — METHYLPREDNISOLONE ACETATE 80 MG/ML IJ SUSP
80.0000 mg | Freq: Once | INTRAMUSCULAR | Status: AC
  Administered 2018-02-18: 80 mg via INTRAMUSCULAR

## 2018-02-18 MED ORDER — DOXYCYCLINE HYCLATE 100 MG PO TABS: 100.0000 mg | ORAL_TABLET | Freq: Two times a day (BID) | ORAL | 0 refills | Status: DC

## 2018-02-18 NOTE — Telephone Encounter (Signed)
Finished Z-Pak on 10/20.  Still having pain in Left Ear.  Ears are still draining, some residual muffling sound.  Sinus drainage.  Some cough.  No fever.    Do you want her to come in today?    Phone:  [phone]

## 2018-02-18 NOTE — Telephone Encounter (Signed)
Yes please have her return

## 2018-02-18 NOTE — Telephone Encounter (Signed)
Scheduled for this afternoon at 3:45pm.

## 2018-03-18 NOTE — Progress Notes (Signed)
   Subjective:    Patient ID: Judith Part, female    DOB: Jun 20, 1965, 52 y.o.   MRN: [id number]  HPI Patient has had ear pain and scratchy throat.  Symptoms been present for several days.  Son has similar illness.  Some maxillary sinus pressure.  Malaise and fatigue.  Some low-grade fever.  Complaining of left ear pain    Review of Systems see above     Objective:   Physical Exam Skin warm and dry.  Nodes none.  Pharynx slightly injected.  TMs slightly full but not red.       Assessment & Plan:  Otalgia of both ears  Acute maxillary sinusitis  Plan: Zithromax Z-PAK take 2 tablets day 1 followed by 1 tablet days 2 through 5.

## 2018-03-18 NOTE — Patient Instructions (Signed)
Take Zithromax Z-PAK 2 tablets day 1 followed by 1 tablet days 2 through 5.  Rest and drink plenty of fluids.

## 2018-04-23 ENCOUNTER — Other Ambulatory Visit: Payer: Self-pay | Admitting: Internal Medicine

## 2018-04-28 ENCOUNTER — Other Ambulatory Visit: Payer: No Typology Code available for payment source | Admitting: Internal Medicine

## 2018-04-28 DIAGNOSIS — Z8669 Personal history of other diseases of the nervous system and sense organs: Secondary | ICD-10-CM

## 2018-04-28 DIAGNOSIS — N920 Excessive and frequent menstruation with regular cycle: Secondary | ICD-10-CM

## 2018-04-28 DIAGNOSIS — E039 Hypothyroidism, unspecified: Secondary | ICD-10-CM

## 2018-04-28 DIAGNOSIS — F419 Anxiety disorder, unspecified: Secondary | ICD-10-CM

## 2018-04-28 DIAGNOSIS — E7800 Pure hypercholesterolemia, unspecified: Secondary | ICD-10-CM

## 2018-04-28 DIAGNOSIS — G250 Essential tremor: Secondary | ICD-10-CM

## 2018-04-28 DIAGNOSIS — E785 Hyperlipidemia, unspecified: Secondary | ICD-10-CM

## 2018-04-28 DIAGNOSIS — Z0000 Encounter for general adult medical examination without abnormal findings: Secondary | ICD-10-CM

## 2018-04-28 DIAGNOSIS — D259 Leiomyoma of uterus, unspecified: Secondary | ICD-10-CM

## 2018-04-28 DIAGNOSIS — E559 Vitamin D deficiency, unspecified: Secondary | ICD-10-CM

## 2018-04-28 DIAGNOSIS — G4730 Sleep apnea, unspecified: Secondary | ICD-10-CM

## 2018-04-29 LAB — CBC WITH DIFFERENTIAL/PLATELET
Absolute Monocytes: 378 cells/uL (ref 200–950)
BASOS ABS: 59 {cells}/uL (ref 0–200)
Basophils Relative: 1.1 %
EOS ABS: 119 {cells}/uL (ref 15–500)
Eosinophils Relative: 2.2 %
HCT: 35.4 % (ref 35.0–45.0)
Hemoglobin: 11.7 g/dL (ref 11.7–15.5)
Lymphs Abs: 1874 cells/uL (ref 850–3900)
MCH: 27.7 pg (ref 27.0–33.0)
MCHC: 33.1 g/dL (ref 32.0–36.0)
MCV: 83.7 fL (ref 80.0–100.0)
MONOS PCT: 7 %
MPV: 10 fL (ref 7.5–12.5)
Neutro Abs: 2970 cells/uL (ref 1500–7800)
Neutrophils Relative %: 55 %
Platelets: 322 10*3/uL (ref 140–400)
RBC: 4.23 10*6/uL (ref 3.80–5.10)
RDW: 13 % (ref 11.0–15.0)
Total Lymphocyte: 34.7 %
WBC: 5.4 10*3/uL (ref 3.8–10.8)

## 2018-04-29 LAB — COMPLETE METABOLIC PANEL WITH GFR
AG Ratio: 1.6 (calc) (ref 1.0–2.5)
ALT: 12 U/L (ref 6–29)
AST: 10 U/L (ref 10–35)
Albumin: 4.1 g/dL (ref 3.6–5.1)
Alkaline phosphatase (APISO): 79 U/L (ref 33–130)
BUN: 12 mg/dL (ref 7–25)
CO2: 25 mmol/L (ref 20–32)
Calcium: 9.2 mg/dL (ref 8.6–10.4)
Chloride: 105 mmol/L (ref 98–110)
Creat: 0.68 mg/dL (ref 0.50–1.05)
GFR, EST AFRICAN AMERICAN: 117 mL/min/{1.73_m2} (ref >=60)
GFR, EST NON AFRICAN AMERICAN: 101 mL/min/{1.73_m2} (ref >=60)
Globulin: 2.6 g/dL (calc) (ref 1.9–3.7)
Glucose, Bld: 93 mg/dL (ref 65–99)
Potassium: 4.3 mmol/L (ref 3.5–5.3)
Sodium: 140 mmol/L (ref 135–146)
TOTAL PROTEIN: 6.7 g/dL (ref 6.1–8.1)
Total Bilirubin: 0.5 mg/dL (ref 0.2–1.2)

## 2018-04-29 LAB — LIPID PANEL
Cholesterol: 189 mg/dL (ref <200)
HDL: 57 mg/dL (ref >50)
LDL Cholesterol (Calc): 115 mg/dL (calc) — ABNORMAL HIGH
NON-HDL CHOLESTEROL (CALC): 132 mg/dL — AB (ref <130)
Total CHOL/HDL Ratio: 3.3 (calc) (ref <5.0)
Triglycerides: 78 mg/dL (ref <150)

## 2018-04-29 LAB — VITAMIN D 25 HYDROXY (VIT D DEFICIENCY, FRACTURES): Vit D, 25-Hydroxy: 32 ng/mL (ref 30–100)

## 2018-04-29 LAB — TSH: TSH: 2.64 mIU/L

## 2018-05-02 ENCOUNTER — Encounter: Payer: Self-pay | Admitting: Internal Medicine

## 2018-05-02 ENCOUNTER — Ambulatory Visit (INDEPENDENT_AMBULATORY_CARE_PROVIDER_SITE_OTHER): Payer: No Typology Code available for payment source | Admitting: Internal Medicine

## 2018-05-02 VITALS — BP 100/80 | HR 80 | Ht 67.25 in | Wt 216.0 lb

## 2018-05-02 DIAGNOSIS — G250 Essential tremor: Secondary | ICD-10-CM

## 2018-05-02 DIAGNOSIS — E039 Hypothyroidism, unspecified: Secondary | ICD-10-CM | POA: Diagnosis not present

## 2018-05-02 DIAGNOSIS — E559 Vitamin D deficiency, unspecified: Secondary | ICD-10-CM

## 2018-05-02 DIAGNOSIS — Z8669 Personal history of other diseases of the nervous system and sense organs: Secondary | ICD-10-CM

## 2018-05-02 DIAGNOSIS — G4730 Sleep apnea, unspecified: Secondary | ICD-10-CM

## 2018-05-02 DIAGNOSIS — R319 Hematuria, unspecified: Secondary | ICD-10-CM

## 2018-05-02 DIAGNOSIS — Z23 Encounter for immunization: Secondary | ICD-10-CM

## 2018-05-02 DIAGNOSIS — D259 Leiomyoma of uterus, unspecified: Secondary | ICD-10-CM

## 2018-05-02 DIAGNOSIS — Z6833 Body mass index (BMI) 33.0-33.9, adult: Secondary | ICD-10-CM

## 2018-05-02 DIAGNOSIS — E669 Obesity, unspecified: Secondary | ICD-10-CM

## 2018-05-02 DIAGNOSIS — Z0000 Encounter for general adult medical examination without abnormal findings: Secondary | ICD-10-CM | POA: Diagnosis not present

## 2018-05-02 LAB — POCT URINALYSIS DIPSTICK
Appearance: NEGATIVE
Bilirubin, UA: NEGATIVE
Glucose, UA: NEGATIVE
KETONES UA: NEGATIVE
Leukocytes, UA: NEGATIVE
Nitrite, UA: NEGATIVE
Odor: NEGATIVE
Protein, UA: NEGATIVE
SPEC GRAV UA: 1.01 (ref 1.010–1.025)
Urobilinogen, UA: 0.2 E.U./dL
pH, UA: 6.5 (ref 5.0–8.0)

## 2018-05-02 MED ORDER — MELOXICAM 15 MG PO TABS: 15.0000 mg | ORAL_TABLET | Freq: Every day | ORAL | 1 refills | Status: DC

## 2018-05-02 NOTE — Patient Instructions (Addendum)
Take Meloxicam when exercising. Watch diet and exercise. Stop statin for now.  I am not sure that your statin medicine is contributing to your musculoskeletal pain.  Take vitamin D3 2000 units daily.  Vitamin D level will be checked with TSH and lipids when you return.  Continue other medications as previously prescribed.  Work on diet and exercise.  Consider consultation with Dr. Leafy Ro for weight loss.  Follow-up with Dr. Dellis Filbert for fibroids.  Take Micronor as she has prescribed.  Continue same dose of thyroid replacement.

## 2018-05-02 NOTE — Progress Notes (Signed)
Subjective:    Patient ID: Tricia Miranda, female    DOB: [birth year], 53 y.o.   MRN: [id number]  HPI 53 year old Female for health maintenance exam and evaluation of medical issues.   Recently saw Dr. Alfonso Ramus for right knee pain. Had steroid injection. Has osteoarthritis. We will give her small quantity of Meloxicam to take before exercising to see if helps.  Hx sleep apnea.Oral appliance ordered.  Has GYN- Dr. Dellis Filbert who follows fibroids.Hx endometrial polyp removed 2015. Takes OCP for heavy flow.  Advise taking 4000 units Vitamin D daily. Level is 32 and she is not taking quite that much Vitamin D now.  She has a history of obesity, hypothyroidism, anxiety, vitamin D deficiency, basal cell carcinoma of the left base in 2009, essential tremor, hyperlipidemia treated with statin medication, fibrocystic breast disease, history of migraine headaches.  Past medical history: Right comminuted humerus fracture October 2007.  Left fibula fracture March 2009.  While residing in [place] a number of years ago she developed a fine tremor and question was raised about multiple sclerosis.  She had an MRI of the brain at that time.  There was a punctate focus of T2 hypersensitivity in the superior left frontal white matter described as nonspecific there was also a tiny focus of enhancement in the pons likely representing capillary dilatation.  She saw a neurologist at [facility] and subsequently went to see someone at the [facility] for more testing.  She had nerve conduction testing.  I do not have a copies of those reports.  In any case the fine tremor has not progressed and she has no further symptoms related to this.  She is anxious about her health.  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  One son.  She does not smoke.  Social alcohol consumption.  Does  not get much exercise.  Family history: Father with history of MI and alcoholism.  Mother in good health.  One sister with history of hip dysplasia.  Paternal grandmother with history of adult onset diabetes mellitus.       Review of Systems history of uterine fibroid.  GYN exam done by Dr. Dellis Filbert  in BMI of 34.72 noted then.  Had episode of vaginal bleeding after vigorous physical activity.  Prior to that last menstrual period was April 2019.  Had Pap in 2018 that was normal.  Had colonoscopy in 2018.  Patient noted to have enlarged uterus from fibroids.  Suspected to be perimenopausal.  Was treated with Prometrium 200 mg at bedtime x10 nights.  Consideration given to progesterone only pill and Micronor was prescribed.  GYN plans to repeat pelvic ultrasound in February.  FSH was normal at 13.2.  Progesterone only pill recommended.    Objective:   Physical Exam Vitals signs reviewed.  Constitutional:      General: She is not in acute distress.    Appearance: Normal appearance. She is not ill-appearing.  HENT:     Head: Normocephalic and atraumatic.     Right Ear: Tympanic membrane normal.     Left Ear: Tympanic membrane normal.     Nose: Nose normal.     Mouth/Throat:     Mouth: Mucous membranes are moist.     Pharynx: Oropharynx is clear. No oropharyngeal exudate.  Eyes:     General: No scleral icterus.  Right eye: No discharge.        Left eye: No discharge.     Extraocular Movements: Extraocular movements intact.     Conjunctiva/sclera: Conjunctivae normal.     Pupils: Pupils are equal, round, and reactive to light.  Neck:     Musculoskeletal: No neck rigidity.     Comments: No thyromegaly Cardiovascular:     Rate and Rhythm: Normal rate and regular rhythm.     Heart sounds: Normal heart sounds. No murmur.  Pulmonary:     Effort: Pulmonary effort is normal.     Breath sounds: Normal breath sounds. No wheezing or rales.     Comments: Breast without masses Abdominal:       General: Bowel sounds are normal. There is no distension.     Palpations: Abdomen is soft.     Tenderness: There is no abdominal tenderness. There is no guarding or rebound.     Comments: Firmness mid lower abdomen consistent with fibroid uterus  Genitourinary:    Comments: Deferred to GYN Musculoskeletal:        General: No deformity.  Skin:    General: Skin is warm and dry.  Neurological:     General: No focal deficit present.     Mental Status: She is alert and oriented to person, place, and time.     Cranial Nerves: No cranial nerve deficit.     Sensory: No sensory deficit.     Motor: No weakness.     Coordination: Coordination normal.  Psychiatric:        Mood and Affect: Mood normal.        Behavior: Behavior normal.        Thought Content: Thought content normal.        Judgment: Judgment normal.           Assessment & Plan:  Hypothyroidism-stable on thyroid replacement-continue current dose  Uterine fibroids- followed by [facility]  Perimenopause-being treated with Micronor presently with follow-up by Dr. Dellis Filbert in February  Anxiety-treated with Xanax longstanding  Knee pain-continue to be followed by orthopedist.  Faythe Ghee to have knee injections  Hyperlipidemia treated with statin medication and lipids are stable on this medication except for an LDL of 115.  However she thinks that this may be aggravating her knee pain and she has stopped it.  We can stay off of it for 3 months and see if symptoms improve.  I personally do not think this is related to her knee pain.  Very fine resting tremor not very noticeable and longstanding  Vitamin D deficiency-level was 23-recommend 2000 units vitamin D3 daily and follow-up in 6 months  Plan: Recommend 2000 units vitamin D3 daily.  Level is 23 and should be 30.  Essential tremor stable without treatment.  History of migraine headaches.  Hyperlipidemia stable on Crestor.  Continue same dose of thyroid replacement.  Work on  diet exercise and weight loss.  May want to see Dr. Leafy Ro for consultation.  Follow-up in 6 months for med check, TSH and vitamin D level.  Urine dipstick shows moderate occult blood but microscopic exam and labs shows no hematuria or red cells.  Culture is negative.

## 2018-05-03 LAB — URINALYSIS, MICROSCOPIC ONLY
Bacteria, UA: NONE SEEN /HPF
Hyaline Cast: NONE SEEN /LPF
RBC / HPF: NONE SEEN /HPF (ref 0–2)
Squamous Epithelial / HPF: NONE SEEN /HPF (ref <=5)
WBC, UA: NONE SEEN /HPF (ref 0–5)

## 2018-05-03 LAB — URINE CULTURE
MICRO NUMBER:: 46393
Result:: NO GROWTH
SPECIMEN QUALITY:: ADEQUATE

## 2018-05-18 ENCOUNTER — Encounter: Payer: Self-pay | Admitting: Internal Medicine

## 2018-06-06 ENCOUNTER — Other Ambulatory Visit: Payer: Self-pay | Admitting: Obstetrics & Gynecology

## 2018-06-06 ENCOUNTER — Ambulatory Visit: Payer: No Typology Code available for payment source

## 2018-06-06 ENCOUNTER — Ambulatory Visit (INDEPENDENT_AMBULATORY_CARE_PROVIDER_SITE_OTHER): Payer: No Typology Code available for payment source | Admitting: Obstetrics & Gynecology

## 2018-06-06 DIAGNOSIS — N852 Hypertrophy of uterus: Secondary | ICD-10-CM | POA: Diagnosis not present

## 2018-06-06 DIAGNOSIS — N921 Excessive and frequent menstruation with irregular cycle: Secondary | ICD-10-CM

## 2018-06-06 DIAGNOSIS — R102 Pelvic and perineal pain: Secondary | ICD-10-CM | POA: Diagnosis not present

## 2018-06-06 DIAGNOSIS — D251 Intramural leiomyoma of uterus: Secondary | ICD-10-CM

## 2018-06-06 DIAGNOSIS — D219 Benign neoplasm of connective and other soft tissue, unspecified: Secondary | ICD-10-CM | POA: Diagnosis not present

## 2018-06-06 NOTE — Progress Notes (Signed)
    Tricia Miranda July 25, 1965 [id number]        53 y.o.  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  RP: Large symptomatic uterine Fibroids for Pelvic US  HPI: Menometrorrhagia with pelvic pain and lower abdominal fullness and pressure.  Known large uterine Fibroids.   OB History  Gravida Para Term Preterm AB Living  1 1       1   SAB TAB Ectopic Multiple Live Births               # Outcome Date GA Lbr Len/2nd Weight Sex Delivery Anes PTL Lv  1 Para             Past medical history,surgical history, problem list, medications, allergies, family history and social history were all reviewed and documented in the EPIC chart.   Directed ROS with pertinent positives and negatives documented in the history of present illness/assessment and plan.  Exam:  There were no vitals filed for this visit. General appearance:  Normal  Pelvic US today: T/V and T/A images.  Anteverted uterus measuring 16.83 x 15.04 x 10.65 cm.  Intramural fibroids heterogeneous with calcifications measuring 11.1 x 7.8 x 13.7 cm, mildly increased in size since last pelvic ultrasound in October 2018.  Unable to evaluate the endometrial lining.  Right ovary located behind the uterine wall, appears normal by transabdominal images.  Left ovary not seen.  No free fluid in the posterior cul-de-sac.   Assessment/Plan:  53 y.o. G1P1   1. Fibroids Pelvic US findings thoroughly reviewed with patient.  Very large myomatous uterus with largest fibroid increased in size x last [place], now measuring 11.1 x 13.7 x 7.8 cm. [place] limited by the Myomatous uterus, endometrial line not well seen and the left ovary not seen.  Will investigate further with a Pelvic MRI.  TAH/Bilateral Salpingectomy discussed, will reevaluate with Pelvic MRI results.  2. Menorrhagia with irregular cycle Heavy menses with metrorrhagia.  Management as above.  3. Pelvic pain in female Very uncomfortable with cramping, pain and pressure.  Management as  above.  Counseling on above issues and coordination of care >50 % x 25 minutes.  [name] MD, 2:48 PM 06/06/2018

## 2018-06-07 ENCOUNTER — Telehealth: Payer: Self-pay | Admitting: *Deleted

## 2018-06-07 DIAGNOSIS — D219 Benign neoplasm of connective and other soft tissue, unspecified: Secondary | ICD-10-CM

## 2018-06-07 DIAGNOSIS — D259 Leiomyoma of uterus, unspecified: Secondary | ICD-10-CM

## 2018-06-07 NOTE — Telephone Encounter (Signed)
-----   Message from [name], MD sent at 06/06/2018  3:10 PM EST ----- Regarding: Schedule Pelvic MRI Large Fibromatous uterus with 1 large Fibroid which increased in size.  Endometrial line not seen. Left ovary not seen.  MRI of Pelvis/lower abdo for evaluation of Uterus/Fibroid/Endometrium/Ovaries.  F/U with me to discuss a few days after the MRI is done.

## 2018-06-08 ENCOUNTER — Encounter: Payer: Self-pay | Admitting: Obstetrics & Gynecology

## 2018-06-08 NOTE — Patient Instructions (Signed)
1. Fibroids Pelvic US findings thoroughly reviewed with patient.  Very large myomatous uterus with largest fibroid increased in size x last [place], now measuring 11.1 x 13.7 x 7.8 cm. [place] limited by the Myomatous uterus, endometrial line not well seen and the left ovary not seen.  Will investigate further with a Pelvic MRI.  TAH/Bilateral Salpingectomy discussed, will reevaluate with Pelvic MRI results.  2. Menorrhagia with irregular cycle Heavy menses with metrorrhagia.  Management as above.  3. Pelvic pain in female Very uncomfortable with cramping, pain and pressure.  Management as above.  Tricia Miranda, it was a pleasure seeing you today!

## 2018-06-08 NOTE — Telephone Encounter (Signed)
Spoke with Wil[facility]) and was told only pelvis w/wo contrast needed for the below. Order placed at [place], left message for patient to call to let her know to call [facility] to schedule per GI protocol.

## 2018-06-13 ENCOUNTER — Telehealth: Payer: Self-pay

## 2018-06-13 NOTE — Telephone Encounter (Signed)
I called and spoke with [facility] and received case #[id number] and was told it was approved.  I was to hold the line to listen to recorded details. It offered the option to have them faxed and I opted for that. It immediately sent me to customer survery. I disconnected. Hopefully a fax will still come.

## 2018-06-13 NOTE — Telephone Encounter (Signed)
Scheduled on 06/24/18 @ 1:40pm

## 2018-06-20 ENCOUNTER — Encounter: Payer: Self-pay | Admitting: Anesthesiology

## 2018-06-24 ENCOUNTER — Ambulatory Visit
Admission: RE | Admit: 2018-06-24 | Discharge: 2018-06-24 | Disposition: A | Discharge status: Home / Self Care | Payer: No Typology Code available for payment source | Source: Ambulatory Visit | Attending: Obstetrics & Gynecology | Admitting: Obstetrics & Gynecology

## 2018-06-24 DIAGNOSIS — D219 Benign neoplasm of connective and other soft tissue, unspecified: Secondary | ICD-10-CM

## 2018-06-24 DIAGNOSIS — D259 Leiomyoma of uterus, unspecified: Secondary | ICD-10-CM

## 2018-06-24 MED ORDER — GADOBENATE DIMEGLUMINE 529 MG/ML IV SOLN
20.0000 mL | Freq: Once | INTRAVENOUS | Status: AC | PRN
  Administered 2018-06-24: 20 mL via INTRAVENOUS

## 2018-07-07 ENCOUNTER — Other Ambulatory Visit: Payer: Self-pay

## 2018-07-11 ENCOUNTER — Ambulatory Visit: Payer: No Typology Code available for payment source | Admitting: Obstetrics & Gynecology

## 2018-07-11 ENCOUNTER — Telehealth: Payer: Self-pay | Admitting: *Deleted

## 2018-07-11 NOTE — Telephone Encounter (Signed)
Pt was called to consent to changing RG visit 07/11/18 to televisit. Pt did reschedule to 07/12/18 due to work.   Pt did state developed cough yesterday with no other symptoms. Specifically asked and pt stated no fever, no SOB, no stuffiness, no travel. I advised patient to continue to watch symptoms and if anything changes to call us back or contact PCP. We will talk to patient via televisit tomorrow at 1230pm. KW CMA

## 2018-07-12 ENCOUNTER — Telehealth (INDEPENDENT_AMBULATORY_CARE_PROVIDER_SITE_OTHER): Payer: No Typology Code available for payment source | Admitting: Obstetrics & Gynecology

## 2018-07-12 ENCOUNTER — Other Ambulatory Visit: Payer: Self-pay

## 2018-07-12 DIAGNOSIS — R102 Pelvic and perineal pain: Secondary | ICD-10-CM | POA: Diagnosis not present

## 2018-07-12 DIAGNOSIS — N921 Excessive and frequent menstruation with irregular cycle: Secondary | ICD-10-CM | POA: Diagnosis not present

## 2018-07-12 DIAGNOSIS — D219 Benign neoplasm of connective and other soft tissue, unspecified: Secondary | ICD-10-CM | POA: Diagnosis not present

## 2018-07-12 NOTE — Progress Notes (Addendum)
    Tricia Miranda June 13, 1965 [id number]        53 y.o.  G1P1L1 Married.    Telephone visit conducted between patient and myself only as I was in my office and patient was in her home.  Duration of the visit including the phone consultation and the review of chart was 30 minutes.  Patient consented to a telephone visit.  RP: Large Fibroids with pain/menometrorrhagia for management counseling post Pelvic MRI  HPI: Abdominopelvic pain and discomfort with menometrorrhagia.  No heavy bleeding currently.  Pain/discomfort not requiring Narcotics.   OB History  Gravida Para Term Preterm AB Living  1 1       1   SAB TAB Ectopic Multiple Live Births               # Outcome Date GA Lbr Len/2nd Weight Sex Delivery Anes PTL Lv  1 Para             Past medical history,surgical history, problem list, medications, allergies, family history and social history were all reviewed and documented in the EPIC chart.   Directed ROS with pertinent positives and negatives documented in the history of present illness/assessment and plan.  Exam:  There were no vitals filed for this visit. General appearance:  Normal  Deferred Gyn exam  MRI of Pelvis 06/24/2018:  Reproductive:  -- Uterus: Measures 17.0 by 10.7 by 14.7 cm (volume = 1400 cm^3). A single large central uterine fibroid is seen which appears submucosal in location. This fibroid measures 11.7 x 9.4 by 13.1 cm, and shows diffuse heterogeneous contrast enhancement. No abnormal endometrial thickening seen. Normal appearance of cervix.  -- Right ovary: Not well visualized, however no adnexal mass identified.  -- Left ovary: Not well visualized, however no adnexal mass identified.  Other: No peritoneal thickening or abnormal free fluid.  Musculoskeletal:  Unremarkable.  IMPRESSION: Single large central submucosal fibroid measuring 13 cm.  No adnexal mass or other significant abnormality.    Assessment/Plan:  53 y.o. G1P1   1.  Fibroids Large symptomatic uterine fibroids with abdominal pelvic pain and menometrorrhagia.  Pelvic MRI findings thoroughly reviewed with patient.  After counseling on management, patient desires to proceed with a total abdominal hysterectomy with bilateral salpingectomy.  Patient is 53 year old and not menopausal, will probably preserve her ovaries, but will further discuss at the preop visit counseling on risks and benefits of preservation of the ovaries versus removal.  Surgery and risk discussed.  Patient will follow-up for preop visit.  Will call for reevaluation if worsening of pain or severe menorrhagia.  2. Menometrorrhagia As above.  3. Pelvic pain in female As above.  [name] MD, 12:49 PM 07/12/2018

## 2018-07-27 ENCOUNTER — Encounter: Payer: Self-pay | Admitting: *Deleted

## 2018-08-25 ENCOUNTER — Telehealth: Payer: Self-pay

## 2018-08-25 NOTE — Telephone Encounter (Signed)
Per DPR access note on file I left patient a message in voice mail that we are resuming scheduling surgery.  I advised her of available dates 6/2 and 6/8 and asked her to consider and call me. I let her know I have also checked ins benefits and will discuss. Asked her to call me when she can and left the hours I will be working/available.

## 2018-08-30 NOTE — Telephone Encounter (Signed)
Patient called back stating she would prefer to wait and do surgery in July as there is a lot of scheduled leave at her job that she is covering for.  I let her know I will call her as soon as I receive the June schedule.

## 2018-09-16 ENCOUNTER — Other Ambulatory Visit: Payer: Self-pay | Admitting: Internal Medicine

## 2018-09-21 ENCOUNTER — Telehealth: Payer: Self-pay

## 2018-09-21 NOTE — Telephone Encounter (Signed)
Spoke with patient to informed her that the July schedule is ready. She would like to have surgery on July 28 at 9:00am.  We have previously reviewed her ins benefits and her estimated surgery prepymt and I will send her a Ambulance person.   I informed her she will have Covid testing a few days before and will need to quarantine after the test until surgery so not to plan anything in those days.  I will mail her a financial letter and a [facility] pamphlet, sheet ov Covid testing and sheet on overnight stay at [facility].  Pre op appt scheduled.

## 2018-09-23 ENCOUNTER — Other Ambulatory Visit: Payer: Self-pay | Admitting: Internal Medicine

## 2018-10-17 ENCOUNTER — Telehealth: Payer: Self-pay

## 2018-10-17 NOTE — Telephone Encounter (Signed)
I called patient to advise Covid screen test will be required 4 days prior to her surgery. Appt scheduled. Instructions to quarantine from time of test until surgery reviewed with her.  Info Sheet mailed.

## 2018-10-20 ENCOUNTER — Encounter: Payer: Self-pay | Admitting: Anesthesiology

## 2018-10-25 ENCOUNTER — Other Ambulatory Visit: Payer: Self-pay

## 2018-10-25 ENCOUNTER — Other Ambulatory Visit: Payer: No Typology Code available for payment source | Admitting: Internal Medicine

## 2018-10-25 DIAGNOSIS — E039 Hypothyroidism, unspecified: Secondary | ICD-10-CM

## 2018-10-26 LAB — TSH: TSH: 2.24 mIU/L

## 2018-10-27 ENCOUNTER — Encounter: Payer: Self-pay | Admitting: Internal Medicine

## 2018-10-27 ENCOUNTER — Ambulatory Visit: Payer: No Typology Code available for payment source | Admitting: Internal Medicine

## 2018-10-27 ENCOUNTER — Other Ambulatory Visit: Payer: Self-pay

## 2018-10-27 VITALS — BP 120/80 | HR 82 | Ht 67.75 in | Wt 235.0 lb

## 2018-10-27 DIAGNOSIS — E039 Hypothyroidism, unspecified: Secondary | ICD-10-CM

## 2018-10-27 DIAGNOSIS — F419 Anxiety disorder, unspecified: Secondary | ICD-10-CM

## 2018-10-27 DIAGNOSIS — D259 Leiomyoma of uterus, unspecified: Secondary | ICD-10-CM

## 2018-10-27 DIAGNOSIS — Z8669 Personal history of other diseases of the nervous system and sense organs: Secondary | ICD-10-CM

## 2018-10-27 DIAGNOSIS — G250 Essential tremor: Secondary | ICD-10-CM | POA: Diagnosis not present

## 2018-10-27 DIAGNOSIS — Z6836 Body mass index (BMI) 36.0-36.9, adult: Secondary | ICD-10-CM

## 2018-10-27 NOTE — Progress Notes (Signed)
   Subjective:    Patient ID: Tricia Miranda, female    DOB: 1965/08/20, 53 y.o.   MRN: [id number]  HPI For 85-month recheck on hypothyroidism and anxiety.  Currently working from home during the Coronavirus pandemic.  Seen in person today.  Scheduled for TAH/BSO July 28 by Dr. Dellis Filbert.  Longstanding history of symptomatic fibroids.    She has a history of anxiety which is stable on Xanax.  Takes Prozac 10 mg daily.  History of hypothyroidism treated with Synthroid 0.112 mg daily.  TSH is normal on current dose of thyroid replacement.   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  She does not smoke.  Social alcohol consumption.  Remote history of migraine headaches.  History of elevated LDL.  Has been treated with Crestor 5 mg daily but currently does not appear to be taking it I think because of issues of musculoskeletal pain.  Her LDL is 115.   Review of Systems no new complaints.  History of right knee pain for which she has had a steroid injection and has taken Meloxicam.     Objective:   Physical Exam  Neck is supple without thyromegaly.  Chest clear.  Cardiac exam regular rate and rhythm.  Extremities without edema      Assessment & Plan:  Symptomatic fibroids scheduled for hysterectomy BSO July 28  Hypothyroidism-stable with current dose of thyroid replacement  History of hyperlipidemia-currently not on statin  History of right knee pain  History of anxiety treated with Xanax  Plan: She will likely feel much better after surgery.  Fibroids have been symptomatic and bothered her for some time.  Continue current medications.  Health maintenance exam will be due January 2021.  Recommend annual flu vaccine.

## 2018-11-03 ENCOUNTER — Telehealth: Payer: Self-pay

## 2018-11-03 NOTE — Telephone Encounter (Signed)
I called patient and left a message asking her if she would be okay with me moving her surgery from 8:30am to 7:30am as the doctor has had a meeting to cancel that will allow her surgery to start earlier.  I asked her to call me back and let me know.

## 2018-11-04 ENCOUNTER — Other Ambulatory Visit: Payer: Self-pay

## 2018-11-04 NOTE — Telephone Encounter (Signed)
I spoke with patient and she agreed to moving up time to 7:30am.

## 2018-11-07 ENCOUNTER — Encounter: Payer: Self-pay | Admitting: Obstetrics & Gynecology

## 2018-11-07 ENCOUNTER — Ambulatory Visit (INDEPENDENT_AMBULATORY_CARE_PROVIDER_SITE_OTHER): Payer: No Typology Code available for payment source | Admitting: Obstetrics & Gynecology

## 2018-11-07 ENCOUNTER — Other Ambulatory Visit: Payer: Self-pay

## 2018-11-07 VITALS — BP 140/84

## 2018-11-07 DIAGNOSIS — N921 Excessive and frequent menstruation with irregular cycle: Secondary | ICD-10-CM

## 2018-11-07 DIAGNOSIS — R102 Pelvic and perineal pain: Secondary | ICD-10-CM

## 2018-11-07 DIAGNOSIS — D219 Benign neoplasm of connective and other soft tissue, unspecified: Secondary | ICD-10-CM | POA: Diagnosis not present

## 2018-11-08 ENCOUNTER — Encounter: Payer: Self-pay | Admitting: Obstetrics & Gynecology

## 2018-11-08 NOTE — Patient Instructions (Signed)
1. Fibroids Large symptomatic uterine fibroids with menometrorrhagia and pelvic pain. Will continue on the progestin pill until surgery given that it is decreasing the amount of bleeding.  We will proceed with total abdominal hysterectomy with bilateral salpingectomy.  Preop management, surgical procedure and risks and postop precautions thoroughly reviewed with patient.  Risk of infection, trauma, hemorrhage, DVT/PE and anesthesia complication reviewed.  Patient voiced understanding and agreement with plan.  Will have COVID testing prior to surgery.  2. Menometrorrhagia As above.  3. Pelvic pain in female As above.  Vina, it was a pleasure seeing you today!

## 2018-11-08 NOTE — Progress Notes (Signed)
    Tricia Miranda September 27, 1965 [id number]        53 y.o.  G1P1L1  RP: Large symptomatic Fibroids preop TAH/Bilateral Salpingectomy  HPI: Menometrorrhagia with pelvic pain secondary to large uterine fibroids.   OB History  Gravida Para Term Preterm AB Living  1 1       1   SAB TAB Ectopic Multiple Live Births               # Outcome Date GA Lbr Len/2nd Weight Sex Delivery Anes PTL Lv  1 Para             Past medical history,surgical history, problem list, medications, allergies, family history and social history were all reviewed and documented in the EPIC chart.   Directed ROS with pertinent positives and negatives documented in the history of present illness/assessment and plan.  Exam:  Vitals:   11/07/18 1521  BP: 140/84   General appearance:  Normal  Pelvic US 06/06/2018: T/V and T/Aimages. Anteverted uterus measuring 16.83 x 15.04 x 10.65 cm. Intramural fibroids heterogeneous with calcifications measuring 11.1 x 7.8 x 13.7 cm, mildly increased in size since last pelvic ultrasound in October 2018. Unable to evaluate the endometrial lining. Right ovary located behind the uterine wall, appears normal by transabdominal images. Left ovary not seen. No free fluid in the posterior cul-de-sac.   Assessment/Plan:  53 y.o. G1P1   1. Fibroids Large symptomatic uterine fibroids with menometrorrhagia and pelvic pain. Will continue on the progestin pill until surgery given that it is decreasing the amount of bleeding.  We will proceed with total abdominal hysterectomy with bilateral salpingectomy.  Preop management, surgical procedure and risks and postop precautions thoroughly reviewed with patient.  Risk of infection, trauma, hemorrhage, DVT/PE and anesthesia complication reviewed.  Patient voiced understanding and agreement with plan.  Will have COVID testing prior to surgery.  2. Menometrorrhagia As above.  3. Pelvic pain in female As above.  Counseling on above issues and  coordination of care more than 50% for 15 minutes.  [name] MD, 12:55 AM 11/08/2018

## 2018-11-11 ENCOUNTER — Other Ambulatory Visit (HOSPITAL_COMMUNITY)
Admission: RE | Admit: 2018-11-11 | Discharge: 2018-11-11 | Disposition: A | Discharge status: Home / Self Care | Payer: No Typology Code available for payment source | Source: Ambulatory Visit | Attending: Obstetrics & Gynecology | Admitting: Obstetrics & Gynecology

## 2018-11-11 DIAGNOSIS — Z20828 Contact with and (suspected) exposure to other viral communicable diseases: Secondary | ICD-10-CM | POA: Diagnosis present

## 2018-11-11 NOTE — Patient Instructions (Addendum)
YOU HAVE COMPLETED YOUR COVID-19 TEST.  PLEASE FOLLOW ALL THE QUARANTINE  INSTRUCTIONS GIVEN IN YOUR QUARANTINE HANDOUT.     Your procedure is scheduled on  ___7-28-2020__   Report to [facility] AT:  5:30AM   Call this number if you have problems the morning of surgery  [phone].  PLEASE BRING SLEEP APNEA SUPPLIES    OUR ADDRESS IS [place].  WE ARE LOCATED IN [facility].                                     REMEMBER:  DO NOT EAT FOOD OR DRINK LIQUIDS AFTER MIDNIGHT .  TAKE THESE MEDICATIONS MORNING OF SURGERY WITH A SIP OF WATER:  __________________________________  IF YOU ARE SPENDING THE NIGHT AFTER SURGERY PLEASE BRING ALL YOUR PRESCRIPTION MEDICATIONS IN THEIR ORIGINAL BOTTLES.                                    DO NOT WEAR JEWERLY, MAKE UP, OR NAIL POLISH,  DO NOT WEAR LOTIONS, POWDERS, PERFUMES OR DEODORANT. DO NOT SHAVE FOR 24 HOURS PRIOR TO DAY OF SURGERY. MEN MAY SHAVE FACE AND NECK. CONTACTS, GLASSES, OR DENTURES MAY NOT BE WORN TO SURGERY.                                    [facility] IS NOT RESPONSIBLE  FOR ANY BELONGINGS.                                                                     Marland Kitchen  Take these medicines the morning of surgery with A SIP OF WATER: FLUOXETINE, SYNTHROID, NORETHINDRONE, ZYRTEC IF NEEDED, XANAX IF NEEDED, ALBUTEROL INHALER IF NEEDED (PLEASE BRING)                                                                                - Preparing for Surgery Before surgery, you can play an important role.  Because skin is not sterile, your skin needs to be as free of germs as possible.  You can reduce the number of germs on your skin by washing with CHG (chlorahexidine gluconate) soap before surgery.  CHG is an antiseptic cleaner which kills germs and bonds with the skin to continue killing germs even after washing. Please DO NOT use if you have an allergy to CHG or antibacterial soaps.  If  your skin becomes reddened/irritated stop using the CHG and inform your nurse when you arrive at Short Stay. Do not shave (including legs and underarms) for at least 48 hours prior to the first CHG shower.  You may shave your face/neck. Please follow these instructions carefully:  1.  Shower with CHG Soap the night before surgery and the  morning of Surgery.  2.  If you choose to wash your hair, wash your hair first as usual with your  normal  shampoo.  3.  After you shampoo, rinse your hair and body thoroughly to remove the  shampoo.                           4.  Use CHG as you would any other liquid soap.  You can apply chg directly  to the skin and wash                       Gently with a scrungie or clean washcloth.  5.  Apply the CHG Soap to your body ONLY FROM THE NECK DOWN.   Do not use on face/ open                           Wound or open sores. Avoid contact with eyes, ears mouth and genitals (private parts).                       Wash face,  Genitals (private parts) with your normal soap.             6.  Wash thoroughly, paying special attention to the area where your surgery  will be performed.  7.  Thoroughly rinse your body with warm water from the neck down.  8.  DO NOT shower/wash with your normal soap after using and rinsing off  the CHG Soap.                9.  Pat yourself dry with a clean towel.            10.  Wear clean pajamas.            11.  Place clean sheets on your bed the night of your first shower and do not  sleep with pets. Day of Surgery : Do not apply any lotions/deodorants the morning of surgery.  Please wear clean clothes to the hospital/surgery center.  FAILURE TO FOLLOW THESE INSTRUCTIONS MAY RESULT IN THE CANCELLATION OF YOUR SURGERY PATIENT SIGNATURE_________________________________  NURSE SIGNATURE__________________________________  ________________________________________________________________________   Tricia Miranda  An incentive  spirometer is a tool that can help keep your lungs clear and active. This tool measures how well you are filling your lungs with each breath. Taking long deep breaths may help reverse or decrease the chance of developing breathing (pulmonary) problems (especially infection) following:  A long period of time when you are unable to move or be active. BEFORE THE PROCEDURE   If the spirometer includes an indicator to show your best effort, your nurse or respiratory therapist will set it to a desired goal.  If possible, sit up straight or lean slightly forward. Try not to slouch.  Hold the incentive spirometer in an upright position. INSTRUCTIONS FOR USE  1. Sit on the edge of your bed if possible, or sit up as far as you can in bed or on a chair. 2. Hold the incentive spirometer in an upright position. 3. Breathe out normally. 4. Place the mouthpiece in your mouth and seal your lips tightly around it. 5. Breathe in slowly and as deeply as possible, raising the piston or the ball toward the top of the column.  6. Hold your breath for 3-5 seconds or for as long as possible. Allow the piston or ball to fall to the bottom of the column. 7. Remove the mouthpiece from your mouth and breathe out normally. 8. Rest for a few seconds and repeat Steps 1 through 7 at least 10 times every 1-2 hours when you are awake. Take your time and take a few normal breaths between deep breaths. 9. The spirometer may include an indicator to show your best effort. Use the indicator as a goal to work toward during each repetition. 10. After each set of 10 deep breaths, practice coughing to be sure your lungs are clear. If you have an incision (the cut made at the time of surgery), support your incision when coughing by placing a pillow or rolled up towels firmly against it. Once you are able to get out of bed, walk around indoors and cough well. You may stop using the incentive spirometer when instructed by your caregiver.   RISKS AND COMPLICATIONS  Take your time so you do not get dizzy or light-headed.  If you are in pain, you may need to take or ask for pain medication before doing incentive spirometry. It is harder to take a deep breath if you are having pain. AFTER USE  Rest and breathe slowly and easily.  It can be helpful to keep track of a log of your progress. Your caregiver can provide you with a simple table to help with this. If you are using the spirometer at home, follow these instructions: [place] IF:   You are having difficultly using the spirometer.  You have trouble using the spirometer as often as instructed.  Your pain medication is not giving enough relief while using the spirometer.  You develop fever of 100.5 F (38.1 C) or higher. SEEK IMMEDIATE MEDICAL CARE IF:   You cough up bloody sputum that had not been present before.  You develop fever of 102 F (38.9 C) or greater.  You develop worsening pain at or near the incision site. MAKE SURE YOU:   Understand these instructions.  Will watch your condition.  Will get help right away if you are not doing well or get worse. Document Released: 08/17/2006 Document Revised: 06/29/2011 Document Reviewed: 10/18/2006 [facility] Patient Information 2014 [place], [place].   ________________________________________________________________________

## 2018-11-12 LAB — SARS CORONAVIRUS 2 (TAT 6-24 HRS): SARS Coronavirus 2: NEGATIVE

## 2018-11-13 NOTE — Patient Instructions (Signed)
It was a pleasure to see you today.  Continue current medications and follow-up for health maintenance exam January 2021

## 2018-11-14 ENCOUNTER — Other Ambulatory Visit: Payer: Self-pay

## 2018-11-14 ENCOUNTER — Encounter (HOSPITAL_COMMUNITY)
Admission: RE | Admit: 2018-11-14 | Discharge: 2018-11-14 | Disposition: A | Discharge status: Home / Self Care | Payer: No Typology Code available for payment source | Source: Ambulatory Visit | Attending: Obstetrics & Gynecology | Admitting: Obstetrics & Gynecology

## 2018-11-14 ENCOUNTER — Encounter (HOSPITAL_COMMUNITY): Payer: Self-pay

## 2018-11-14 DIAGNOSIS — N921 Excessive and frequent menstruation with irregular cycle: Secondary | ICD-10-CM | POA: Diagnosis not present

## 2018-11-14 DIAGNOSIS — Z79899 Other long term (current) drug therapy: Secondary | ICD-10-CM | POA: Diagnosis not present

## 2018-11-14 DIAGNOSIS — F419 Anxiety disorder, unspecified: Secondary | ICD-10-CM | POA: Diagnosis not present

## 2018-11-14 DIAGNOSIS — D259 Leiomyoma of uterus, unspecified: Secondary | ICD-10-CM | POA: Insufficient documentation

## 2018-11-14 DIAGNOSIS — Z79890 Hormone replacement therapy (postmenopausal): Secondary | ICD-10-CM | POA: Diagnosis not present

## 2018-11-14 DIAGNOSIS — E039 Hypothyroidism, unspecified: Secondary | ICD-10-CM | POA: Diagnosis not present

## 2018-11-14 DIAGNOSIS — Z793 Long term (current) use of hormonal contraceptives: Secondary | ICD-10-CM | POA: Diagnosis not present

## 2018-11-14 DIAGNOSIS — Z85828 Personal history of other malignant neoplasm of skin: Secondary | ICD-10-CM | POA: Diagnosis not present

## 2018-11-14 DIAGNOSIS — Z01812 Encounter for preprocedural laboratory examination: Secondary | ICD-10-CM | POA: Insufficient documentation

## 2018-11-14 DIAGNOSIS — N838 Other noninflammatory disorders of ovary, fallopian tube and broad ligament: Secondary | ICD-10-CM | POA: Diagnosis not present

## 2018-11-14 DIAGNOSIS — R102 Pelvic and perineal pain: Secondary | ICD-10-CM | POA: Diagnosis not present

## 2018-11-14 DIAGNOSIS — G4730 Sleep apnea, unspecified: Secondary | ICD-10-CM | POA: Diagnosis not present

## 2018-11-14 HISTORY — DX: Sleep apnea, unspecified: G47.30

## 2018-11-14 HISTORY — DX: Benign neoplasm of connective and other soft tissue, unspecified: D21.9

## 2018-11-14 LAB — ABO/RH: ABO/RH(D): B POS

## 2018-11-14 LAB — CBC
HCT: 42.2 % (ref 36.0–46.0)
Hemoglobin: 13.2 g/dL (ref 12.0–15.0)
MCH: 26 pg (ref 26.0–34.0)
MCHC: 31.3 g/dL (ref 30.0–36.0)
MCV: 83.1 fL (ref 80.0–100.0)
Platelets: 264 10*3/uL (ref 150–400)
RBC: 5.08 MIL/uL (ref 3.87–5.11)
RDW: 14.6 % (ref 11.5–15.5)
WBC: 5.8 10*3/uL (ref 4.0–10.5)
nRBC: 0 % (ref 0.0–0.2)

## 2018-11-15 ENCOUNTER — Encounter (HOSPITAL_BASED_OUTPATIENT_CLINIC_OR_DEPARTMENT_OTHER)
Admission: RE | Disposition: A | Discharge status: Home / Self Care | Payer: Self-pay | Source: Home / Self Care | Attending: Obstetrics & Gynecology

## 2018-11-15 ENCOUNTER — Other Ambulatory Visit: Payer: Self-pay

## 2018-11-15 ENCOUNTER — Observation Stay (HOSPITAL_BASED_OUTPATIENT_CLINIC_OR_DEPARTMENT_OTHER): Payer: No Typology Code available for payment source | Admitting: Anesthesiology

## 2018-11-15 ENCOUNTER — Ambulatory Visit (HOSPITAL_BASED_OUTPATIENT_CLINIC_OR_DEPARTMENT_OTHER)
Admission: RE | Admit: 2018-11-15 | Discharge: 2018-11-16 | Disposition: A | Discharge status: Home / Self Care | Payer: No Typology Code available for payment source | Attending: Obstetrics & Gynecology | Admitting: Obstetrics & Gynecology

## 2018-11-15 ENCOUNTER — Observation Stay (HOSPITAL_BASED_OUTPATIENT_CLINIC_OR_DEPARTMENT_OTHER): Payer: No Typology Code available for payment source | Admitting: Physician Assistant

## 2018-11-15 ENCOUNTER — Encounter (HOSPITAL_BASED_OUTPATIENT_CLINIC_OR_DEPARTMENT_OTHER): Payer: Self-pay

## 2018-11-15 DIAGNOSIS — Z9889 Other specified postprocedural states: Secondary | ICD-10-CM

## 2018-11-15 DIAGNOSIS — G4730 Sleep apnea, unspecified: Secondary | ICD-10-CM | POA: Insufficient documentation

## 2018-11-15 DIAGNOSIS — D219 Benign neoplasm of connective and other soft tissue, unspecified: Secondary | ICD-10-CM

## 2018-11-15 DIAGNOSIS — N921 Excessive and frequent menstruation with irregular cycle: Secondary | ICD-10-CM | POA: Insufficient documentation

## 2018-11-15 DIAGNOSIS — Z79890 Hormone replacement therapy (postmenopausal): Secondary | ICD-10-CM | POA: Insufficient documentation

## 2018-11-15 DIAGNOSIS — D259 Leiomyoma of uterus, unspecified: Secondary | ICD-10-CM | POA: Insufficient documentation

## 2018-11-15 DIAGNOSIS — Z79899 Other long term (current) drug therapy: Secondary | ICD-10-CM | POA: Insufficient documentation

## 2018-11-15 DIAGNOSIS — E039 Hypothyroidism, unspecified: Secondary | ICD-10-CM | POA: Insufficient documentation

## 2018-11-15 DIAGNOSIS — R102 Pelvic and perineal pain: Secondary | ICD-10-CM | POA: Diagnosis not present

## 2018-11-15 DIAGNOSIS — Z793 Long term (current) use of hormonal contraceptives: Secondary | ICD-10-CM | POA: Insufficient documentation

## 2018-11-15 DIAGNOSIS — Z85828 Personal history of other malignant neoplasm of skin: Secondary | ICD-10-CM | POA: Insufficient documentation

## 2018-11-15 DIAGNOSIS — N838 Other noninflammatory disorders of ovary, fallopian tube and broad ligament: Secondary | ICD-10-CM | POA: Insufficient documentation

## 2018-11-15 DIAGNOSIS — F419 Anxiety disorder, unspecified: Secondary | ICD-10-CM | POA: Insufficient documentation

## 2018-11-15 HISTORY — PX: HYSTERECTOMY ABDOMINAL WITH SALPINGECTOMY: SHX6725

## 2018-11-15 HISTORY — DX: Hypothyroidism, unspecified: E03.9

## 2018-11-15 HISTORY — DX: Malignant (primary) neoplasm, unspecified: C80.1

## 2018-11-15 LAB — TYPE AND SCREEN
ABO/RH(D): B POS
Antibody Screen: NEGATIVE

## 2018-11-15 LAB — POCT PREGNANCY, URINE: Preg Test, Ur: NEGATIVE

## 2018-11-15 SURGERY — HYSTERECTOMY, TOTAL, ABDOMINAL, WITH SALPINGECTOMY
Anesthesia: General | Laterality: Bilateral

## 2018-11-15 MED ORDER — KETOROLAC TROMETHAMINE 30 MG/ML IJ SOLN
INTRAMUSCULAR | Status: AC
  Filled 2018-11-15: qty 1

## 2018-11-15 MED ORDER — OXYCODONE HCL 5 MG PO TABS
ORAL_TABLET | ORAL | Status: AC
  Filled 2018-11-15: qty 1

## 2018-11-15 MED ORDER — CEFAZOLIN SODIUM-DEXTROSE 2-4 GM/100ML-% IV SOLN
2.0000 g | INTRAVENOUS | Status: AC
  Administered 2018-11-15: 2 g via INTRAVENOUS
  Filled 2018-11-15: qty 100

## 2018-11-15 MED ORDER — DEXAMETHASONE SODIUM PHOSPHATE 10 MG/ML IJ SOLN
INTRAMUSCULAR | Status: DC | PRN
  Administered 2018-11-15: 10 mg via INTRAVENOUS

## 2018-11-15 MED ORDER — SUGAMMADEX SODIUM 200 MG/2ML IV SOLN
INTRAVENOUS | Status: DC | PRN
  Administered 2018-11-15: 200 mg via INTRAVENOUS

## 2018-11-15 MED ORDER — FENTANYL CITRATE (PF) 100 MCG/2ML IJ SOLN
INTRAMUSCULAR | Status: DC | PRN
  Administered 2018-11-15: 100 ug via INTRAVENOUS
  Administered 2018-11-15 (×3): 50 ug via INTRAVENOUS

## 2018-11-15 MED ORDER — ONDANSETRON HCL 4 MG/2ML IJ SOLN
INTRAMUSCULAR | Status: AC
  Filled 2018-11-15: qty 2

## 2018-11-15 MED ORDER — OXYCODONE HCL 5 MG PO TABS
5.0000 mg | ORAL_TABLET | Freq: Once | ORAL | Status: DC | PRN
  Filled 2018-11-15: qty 1

## 2018-11-15 MED ORDER — HYDROMORPHONE HCL 1 MG/ML IJ SOLN
0.5000 mg | INTRAMUSCULAR | Status: DC | PRN
  Administered 2018-11-15: 0.5 mg via INTRAVENOUS
  Filled 2018-11-15: qty 0.5

## 2018-11-15 MED ORDER — OXYCODONE HCL 5 MG PO TABS
5.0000 mg | ORAL_TABLET | ORAL | Status: DC | PRN
  Administered 2018-11-15: 5 mg via ORAL
  Filled 2018-11-15: qty 2

## 2018-11-15 MED ORDER — OXYCODONE-ACETAMINOPHEN 5-325 MG PO TABS
ORAL_TABLET | ORAL | Status: AC
  Filled 2018-11-15: qty 1

## 2018-11-15 MED ORDER — LACTATED RINGERS IV SOLN
INTRAVENOUS | Status: DC
  Administered 2018-11-15: 1000 mL via INTRAVENOUS
  Administered 2018-11-15: 10:00:00 via INTRAVENOUS
  Filled 2018-11-15 (×2): qty 1000

## 2018-11-15 MED ORDER — OXYCODONE HCL 5 MG/5ML PO SOLN
5.0000 mg | Freq: Once | ORAL | Status: DC | PRN
  Filled 2018-11-15: qty 5

## 2018-11-15 MED ORDER — LIDOCAINE 2% (20 MG/ML) 5 ML SYRINGE
INTRAMUSCULAR | Status: DC | PRN
  Administered 2018-11-15: 60 mg via INTRAVENOUS

## 2018-11-15 MED ORDER — DEXAMETHASONE SODIUM PHOSPHATE 10 MG/ML IJ SOLN
INTRAMUSCULAR | Status: AC
  Filled 2018-11-15: qty 1

## 2018-11-15 MED ORDER — FENTANYL CITRATE (PF) 100 MCG/2ML IJ SOLN
INTRAMUSCULAR | Status: AC
  Filled 2018-11-15: qty 2

## 2018-11-15 MED ORDER — TRAMADOL HCL 50 MG PO TABS
50.0000 mg | ORAL_TABLET | Freq: Four times a day (QID) | ORAL | Status: DC | PRN
  Administered 2018-11-15 – 2018-11-16 (×2): 50 mg via ORAL
  Filled 2018-11-15: qty 1

## 2018-11-15 MED ORDER — ACETAMINOPHEN 325 MG PO TABS
ORAL_TABLET | ORAL | Status: DC | PRN
  Administered 2018-11-15: 1000 mg via ORAL

## 2018-11-15 MED ORDER — ROCURONIUM BROMIDE 10 MG/ML (PF) SYRINGE
PREFILLED_SYRINGE | INTRAVENOUS | Status: DC | PRN
  Administered 2018-11-15: 50 mg via INTRAVENOUS

## 2018-11-15 MED ORDER — TRAMADOL HCL 50 MG PO TABS
ORAL_TABLET | ORAL | Status: AC
  Filled 2018-11-15: qty 1

## 2018-11-15 MED ORDER — CEFAZOLIN SODIUM-DEXTROSE 2-4 GM/100ML-% IV SOLN
INTRAVENOUS | Status: AC
  Filled 2018-11-15: qty 100

## 2018-11-15 MED ORDER — ONDANSETRON HCL 4 MG/2ML IJ SOLN
4.0000 mg | Freq: Four times a day (QID) | INTRAMUSCULAR | Status: DC | PRN
  Administered 2018-11-15: 4 mg via INTRAVENOUS
  Filled 2018-11-15: qty 2

## 2018-11-15 MED ORDER — SCOPOLAMINE 1 MG/3DAYS TD PT72
MEDICATED_PATCH | TRANSDERMAL | Status: AC
  Filled 2018-11-15: qty 1

## 2018-11-15 MED ORDER — KETOROLAC TROMETHAMINE 30 MG/ML IJ SOLN
INTRAMUSCULAR | Status: DC | PRN
  Administered 2018-11-15: 30 mg via INTRAVENOUS

## 2018-11-15 MED ORDER — PROPOFOL 10 MG/ML IV BOLUS
INTRAVENOUS | Status: DC | PRN
  Administered 2018-11-15: 160 mg via INTRAVENOUS

## 2018-11-15 MED ORDER — ROCURONIUM BROMIDE 10 MG/ML (PF) SYRINGE
PREFILLED_SYRINGE | INTRAVENOUS | Status: AC
  Filled 2018-11-15: qty 10

## 2018-11-15 MED ORDER — BUPIVACAINE HCL (PF) 0.25 % IJ SOLN
INTRAMUSCULAR | Status: DC | PRN
  Administered 2018-11-15: 10 mL

## 2018-11-15 MED ORDER — LACTATED RINGERS IV SOLN
INTRAVENOUS | Status: DC
  Administered 2018-11-15 – 2018-11-16 (×2): via INTRAVENOUS
  Filled 2018-11-15 (×3): qty 1000

## 2018-11-15 MED ORDER — FENTANYL CITRATE (PF) 100 MCG/2ML IJ SOLN
50.0000 ug | Freq: Once | INTRAMUSCULAR | Status: AC
  Administered 2018-11-15: 50 ug via INTRAVENOUS
  Filled 2018-11-15: qty 1

## 2018-11-15 MED ORDER — ONDANSETRON HCL 4 MG/2ML IJ SOLN
4.0000 mg | Freq: Four times a day (QID) | INTRAMUSCULAR | Status: AC | PRN
  Administered 2018-11-15: 4 mg via INTRAVENOUS
  Filled 2018-11-15: qty 2

## 2018-11-15 MED ORDER — HYDROMORPHONE HCL 1 MG/ML IJ SOLN
INTRAMUSCULAR | Status: AC
  Filled 2018-11-15: qty 1

## 2018-11-15 MED ORDER — HYDROMORPHONE HCL 2 MG/ML IJ SOLN
INTRAMUSCULAR | Status: AC
  Filled 2018-11-15: qty 1

## 2018-11-15 MED ORDER — MIDAZOLAM HCL 2 MG/2ML IJ SOLN
INTRAMUSCULAR | Status: AC
  Filled 2018-11-15: qty 2

## 2018-11-15 MED ORDER — EPHEDRINE SULFATE 50 MG/ML IJ SOLN
INTRAMUSCULAR | Status: DC | PRN
  Administered 2018-11-15: 15 mg via INTRAVENOUS

## 2018-11-15 MED ORDER — BUPIVACAINE LIPOSOME 1.3 % IJ SUSP
INTRAMUSCULAR | Status: DC | PRN
  Administered 2018-11-15: 10 mL

## 2018-11-15 MED ORDER — ARTIFICIAL TEARS OPHTHALMIC OINT
TOPICAL_OINTMENT | OPHTHALMIC | Status: AC
  Filled 2018-11-15: qty 3.5

## 2018-11-15 MED ORDER — LACTATED RINGERS IV SOLN
INTRAVENOUS | Status: DC
  Filled 2018-11-15: qty 1000

## 2018-11-15 MED ORDER — OXYCODONE-ACETAMINOPHEN 5-325 MG PO TABS
1.0000 | ORAL_TABLET | ORAL | Status: DC | PRN
  Administered 2018-11-15 – 2018-11-16 (×4): 1 via ORAL
  Filled 2018-11-15: qty 1

## 2018-11-15 MED ORDER — ACETAMINOPHEN 500 MG PO TABS
ORAL_TABLET | ORAL | Status: AC
  Filled 2018-11-15: qty 2

## 2018-11-15 MED ORDER — PROPOFOL 10 MG/ML IV BOLUS
INTRAVENOUS | Status: AC
  Filled 2018-11-15: qty 40

## 2018-11-15 MED ORDER — MIDAZOLAM HCL 2 MG/2ML IJ SOLN
INTRAMUSCULAR | Status: DC | PRN
  Administered 2018-11-15: 2 mg via INTRAVENOUS

## 2018-11-15 MED ORDER — FENTANYL CITRATE (PF) 100 MCG/2ML IJ SOLN
25.0000 ug | INTRAMUSCULAR | Status: DC | PRN
  Administered 2018-11-15 (×3): 50 ug via INTRAVENOUS
  Filled 2018-11-15: qty 1

## 2018-11-15 MED ORDER — ONDANSETRON HCL 4 MG/2ML IJ SOLN
INTRAMUSCULAR | Status: DC | PRN
  Administered 2018-11-15: 4 mg via INTRAVENOUS

## 2018-11-15 MED ORDER — FENTANYL CITRATE (PF) 250 MCG/5ML IJ SOLN
INTRAMUSCULAR | Status: AC
  Filled 2018-11-15: qty 5

## 2018-11-15 MED ORDER — SCOPOLAMINE 1 MG/3DAYS TD PT72
MEDICATED_PATCH | TRANSDERMAL | Status: DC | PRN
  Administered 2018-11-15: 1 via TRANSDERMAL

## 2018-11-15 MED ORDER — LIDOCAINE 2% (20 MG/ML) 5 ML SYRINGE
INTRAMUSCULAR | Status: AC
  Filled 2018-11-15: qty 5

## 2018-11-15 SURGICAL SUPPLY — 35 items
ADH SKN CLS APL DERMABOND .7 (GAUZE/BANDAGES/DRESSINGS) ×1
CANISTER SUCT 3000ML PPV (MISCELLANEOUS) ×2 IMPLANT
DECANTER SPIKE VIAL GLASS SM (MISCELLANEOUS) IMPLANT
DERMABOND ADVANCED (GAUZE/BANDAGES/DRESSINGS) ×1
DERMABOND ADVANCED .7 DNX12 (GAUZE/BANDAGES/DRESSINGS) IMPLANT
DRAPE LAPAROTOMY T 102X78X121 (DRAPES) ×2 IMPLANT
DRAPE WARM FLUID 44X44 (DRAPES) ×1 IMPLANT
DRSG OPSITE POSTOP 4X10 (GAUZE/BANDAGES/DRESSINGS) ×2 IMPLANT
DURAPREP 26ML APPLICATOR (WOUND CARE) ×2 IMPLANT
GAUZE 4X4 16PLY RFD (DISPOSABLE) ×2 IMPLANT
GLOVE BIO SURGEON STRL SZ 6.5 (GLOVE) ×2 IMPLANT
GLOVE BIO SURGEON STRL SZ7.5 (GLOVE) ×3 IMPLANT
GLOVE BIOGEL PI IND STRL 7.0 (GLOVE) ×3 IMPLANT
GLOVE BIOGEL PI INDICATOR 7.0 (GLOVE) ×3
GLOVE NEODERM STER SZ 7 (GLOVE) ×1 IMPLANT
GOWN STRL REUS W/TWL LRG LVL3 (GOWN DISPOSABLE) ×6 IMPLANT
HIBICLENS CHG 4% 4OZ BTL (MISCELLANEOUS) ×2 IMPLANT
NEEDLE HYPO 22GX1.5 SAFETY (NEEDLE) ×4 IMPLANT
PACK ABDOMINAL GYN (CUSTOM PROCEDURE TRAY) ×2 IMPLANT
PAD OB MATERNITY 4.3X12.25 (PERSONAL CARE ITEMS) ×2 IMPLANT
PROTECTOR NERVE ULNAR (MISCELLANEOUS) ×2 IMPLANT
SPONGE LAP 18X18 RF (DISPOSABLE) ×2 IMPLANT
SUT PLAIN 3 0 CT 1 27 (SUTURE) IMPLANT
SUT PROLENE 3 0 SH DA (SUTURE) IMPLANT
SUT VIC AB 0 CT1 18XCR BRD8 (SUTURE) ×2 IMPLANT
SUT VIC AB 0 CT1 27 (SUTURE) ×8
SUT VIC AB 0 CT1 27XBRD ANBCTR (SUTURE) ×4 IMPLANT
SUT VIC AB 0 CT1 8-18 (SUTURE) ×4
SUT VIC AB 3-0 SH 27 (SUTURE)
SUT VIC AB 3-0 SH 27X BRD (SUTURE) IMPLANT
SUT VICRYL 0 TIES 12 18 (SUTURE) ×2 IMPLANT
SUT VICRYL 4-0 PS2 18IN ABS (SUTURE) ×1 IMPLANT
SYR CONTROL 10ML LL (SYRINGE) ×4 IMPLANT
TOWEL OR 17X26 10 PK STRL BLUE (TOWEL DISPOSABLE) ×3 IMPLANT
TRAY FOLEY W/BAG SLVR 14FR (SET/KITS/TRAYS/PACK) ×2 IMPLANT

## 2018-11-15 NOTE — H&P (Signed)
Tricia Miranda is an 53 y.o. female.  G1P1L1  RP: Large symptomatic Fibroids for TAH/Bilateral Salpingectomy  HPI: No change since last office visit on 11/07/2018.  Menometrorrhagia with pelvic pain secondary to large uterine fibroids.    Pertinent Gynecological History: Menses: Menometrorrhagia Contraception: oral progesterone-only contraceptive Blood transfusions: none Sexually transmitted diseases: no past history Last mammogram: normal  Last pap: normal  OB History: G1, P1L1   Menstrual History: Patient's last menstrual period was 07/20/2018.    Past Medical History:  Diagnosis Date  . Anxiety   . Bronchitis   . Cancer ([place])    basal cell- tiny  . Fibroids    uterine   . Hypothyroidism   . Sleep apnea    uses oral device for mild sleep apnea   . Thyroid disease    hypothyroidism    Past Surgical History:  Procedure Laterality Date  . COLONOSCOPY     with benign  polyp removal   . DILITATION & CURRETTAGE/HYSTROSCOPY WITH VERSAPOINT RESECTION N/A 03/16/2014   Procedure: DILATATION & CURETTAGE/HYSTEROSCOPY WITH VERSAPOINT RESECTION;  Surgeon: [name], MD;  Location: [facility];  Service: Gynecology;  Laterality: N/A;  1 hr.  Marland Kitchen MOHS SURGERY Left a while- unknown   under left eyelid  . WISDOM TOOTH EXTRACTION      Family History  Problem Relation Age of Onset  . Heart disease Father   . Alcohol abuse Father   . Stomach cancer Other   . Colon cancer Neg Hx   . Esophageal cancer Neg Hx   . Pancreatic cancer Neg Hx   . Prostate cancer Neg Hx   . Rectal cancer Neg Hx     Social History:  reports that she has never smoked. She has never used smokeless tobacco. She reports current alcohol use of about 3.0 standard drinks of alcohol per week. She reports that she does not use drugs.  Allergies: No Known Allergies  Medications Prior to Admission  Medication Sig Dispense Refill Last Dose  . ALPRAZolam (XANAX) 0.25 MG tablet TAKE ONE TABLET BY MOUTH TWICE A  DAY AS NEEDED (Patient taking differently: Take 0.25 mg by mouth 2 (two) times daily as needed for anxiety. ) 60 tablet 2 11/14/2018 at Unknown time  . cetirizine (ZYRTEC) 10 MG tablet Take 10 mg by mouth daily as needed for allergies.   11/14/2018 at Unknown time  . Cholecalciferol (VITAMIN D) 125 MCG (5000 UT) CAPS Take 5,000 Units by mouth daily.    11/14/2018 at Unknown time  . cyanocobalamin 1000 MCG tablet Take 1,000 mcg by mouth daily.   11/14/2018 at Unknown time  . FLUoxetine (PROZAC) 10 MG capsule TAKE ONE CAPSULE BY MOUTH DAILY (Patient taking differently: Take 10 mg by mouth daily. ) 90 capsule 2 11/14/2018 at Unknown time  . norethindrone (MICRONOR,[name],ERRIN) 0.35 MG tablet Take 1 tablet (0.35 mg total) by mouth daily. 3 Package 4 11/14/2018 at Unknown time  . SYNTHROID 112 MCG tablet TAKE ONE TABLET BY MOUTH DAILY BEFORE BREAKFAST (Patient taking differently: Take 112 mcg by mouth daily before breakfast. TAKE ONE TABLET BY MOUTH DAILY BEFORE BREAKFAST) 90 tablet 0 11/15/2018 at Unknown time  . albuterol (PROVENTIL HFA;VENTOLIN HFA) 108 (90 Base) MCG/ACT inhaler Inhale 2 puffs into the lungs every 6 (six) hours as needed for wheezing or shortness of breath. 1 Inhaler 2 More than a month at Unknown time    REVIEW OF SYSTEMS: A ROS was performed and pertinent positives and negatives are included in  the history.  GENERAL: No fevers or chills. HEENT: No change in vision, no earache, sore throat or sinus congestion. NECK: No pain or stiffness. CARDIOVASCULAR: No chest pain or pressure. No palpitations. PULMONARY: No shortness of breath, cough or wheeze. GASTROINTESTINAL: No abdominal pain, nausea, vomiting or diarrhea, melena or bright red blood per rectum. GENITOURINARY: No urinary frequency, urgency, hesitancy or dysuria. MUSCULOSKELETAL: No joint or muscle pain, no back pain, no recent trauma. DERMATOLOGIC: No rash, no itching, no lesions. ENDOCRINE: No polyuria, polydipsia, no heat or cold  intolerance. No recent change in weight. HEMATOLOGICAL: No anemia or easy bruising or bleeding. NEUROLOGIC: No headache, seizures, numbness, tingling or weakness. PSYCHIATRIC: No depression, no loss of interest in normal activity or change in sleep pattern.     Blood pressure 118/74, pulse 74, temperature 98.3 F (36.8 C), temperature source Oral, resp. rate 16, height 5\' 7"  (1.702 m), weight 106.2 kg, last menstrual period 07/20/2018, SpO2 96 %.  Physical Exam:  See office notes   Results for orders placed or performed during the hospital encounter of 11/15/18 (from the past 24 hour(s))  Pregnancy, urine POC     Status: None   Collection Time: 11/15/18  6:01 AM  Result Value Ref Range   Preg Test, Ur NEGATIVE NEGATIVE   SARS Coronavirus 2 Negative  Pelvic US 06/06/2018: T/V and T/Aimages. Anteverted uterus measuring 16.83 x 15.04 x 10.65 cm. Intramural fibroids heterogeneous with calcifications measuring 11.1 x 7.8 x 13.7 cm, mildly increased in size since last pelvic ultrasound in October 2018. Unable to evaluate the endometrial lining. Right ovary located behind the uterine wall, appears normal by transabdominal images. Left ovary not seen. No free fluid in the posterior cul-de-sac.   Assessment/Plan:  53 y.o. G1P1   1. Fibroids Large symptomatic uterine fibroids with menometrorrhagia and pelvic pain. Will continue on the progestin pill until surgery given that it is decreasing the amount of bleeding.  We will proceed with total abdominal hysterectomy with bilateral salpingectomy.  Preop management, surgical procedure and risks and postop precautions thoroughly reviewed with patient.  Risk of infection, trauma, hemorrhage, DVT/PE and anesthesia complication reviewed.  Patient voiced understanding and agreement with plan.  Will have COVID testing prior to surgery.  2. Menometrorrhagia As above.  3. Pelvic pain in female As above.  Tricia Miranda 11/15/2018, 7:02  AM

## 2018-11-15 NOTE — Anesthesia Preprocedure Evaluation (Addendum)
Anesthesia Evaluation  Patient identified by MRN, date of birth, ID band Patient awake    Reviewed: Allergy & Precautions, H&P , NPO status , Patient's Chart, lab work & pertinent test results  Airway Mallampati: II   Neck ROM: full    Dental  (+) Teeth Intact, Dental Advisory Given,    Pulmonary sleep apnea ,    breath sounds clear to auscultation       Cardiovascular negative cardio ROS   Rhythm:regular Rate:Normal     Neuro/Psych  Headaches, Anxiety    GI/Hepatic   Endo/Other  Hypothyroidism obese  Renal/GU      Musculoskeletal   Abdominal   Peds  Hematology   Anesthesia Other Findings   Reproductive/Obstetrics                            Anesthesia Physical Anesthesia Plan  ASA: II  Anesthesia Plan: General   Post-op Pain Management:    Induction: Intravenous  PONV Risk Score and Plan: 3 and Ondansetron, Dexamethasone, Midazolam and Treatment may vary due to age or medical condition  Airway Management Planned: Oral ETT  Additional Equipment:   Intra-op Plan:   Post-operative Plan: Extubation in OR  Informed Consent: I have reviewed the patients History and Physical, chart, labs and discussed the procedure including the risks, benefits and alternatives for the proposed anesthesia with the patient or authorized representative who has indicated his/her understanding and acceptance.       Plan Discussed with: CRNA, Anesthesiologist and Surgeon  Anesthesia Plan Comments:         Anesthesia Quick Evaluation

## 2018-11-15 NOTE — Transfer of Care (Signed)
Immediate Anesthesia Transfer of Care Note  Patient: Tricia Miranda  Procedure(s) Performed: HYSTERECTOMY ABDOMINAL WITH  BILATERAL SALPINGECTOMY (Bilateral )  Patient Location: PACU  Anesthesia Type:General  Level of Consciousness: awake, alert , oriented and patient cooperative  Airway & Oxygen Therapy: Patient Spontanous Breathing and Patient connected to nasal cannula oxygen  Post-op Assessment: Report given to RN and Post -op Vital signs reviewed and stable  Post vital signs: Reviewed and stable  Last Vitals:  Vitals Value Taken Time  BP    Temp    Pulse    Resp    SpO2      Last Pain:  Vitals:   11/15/18 0632  TempSrc:   PainSc: 0-No pain      Patients Stated Pain Goal: 6 (66/06/00 4599)  Complications: No apparent anesthesia complications

## 2018-11-15 NOTE — Progress Notes (Signed)
Patient found standing in bathroom after voiding.  Clear fluid on floor that patient states is vomit.  Pt has been having nausea throughout the day.  Upon further investigation, patient admits to drinking entire cup of water in addition to small container of apple juice within minutes of getting out of chair.  Patient instructed not to guzzle fluids, but to sip on them going forward.  Assisted back to bed.  Will try to ambulate in hallway later this evening.

## 2018-11-15 NOTE — Anesthesia Procedure Notes (Signed)
Procedure Name: Intubation Date/Time: 11/15/2018 7:28 AM Performed by: Wanita Chamberlain, CRNA Pre-anesthesia Checklist: Patient identified, Emergency Drugs available, Suction available and Patient being monitored Patient Re-evaluated:Patient Re-evaluated prior to induction Oxygen Delivery Method: Circle system utilized Preoxygenation: Pre-oxygenation with 100% oxygen Induction Type: IV induction Ventilation: Mask ventilation without difficulty Laryngoscope Size: Mac and 3 Grade View: Grade I Tube type: Oral Tube size: 7.0 mm Number of attempts: 1 Airway Equipment and Method: Stylet and Bite block Placement Confirmation: breath sounds checked- equal and bilateral,  CO2 detector,  positive ETCO2 and ETT inserted through vocal cords under direct vision Secured at: 22 cm Tube secured with: Tape Dental Injury: Teeth and Oropharynx as per pre-operative assessment

## 2018-11-15 NOTE — Op Note (Signed)
Operative Note  11/15/2018  8:57 AM  PATIENT:  Tricia Miranda  53 y.o. female  PRE-OPERATIVE DIAGNOSIS:  Large fibroids, pain, menometrorrhagia  POST-OPERATIVE DIAGNOSIS:  Large fibroids, pain, menometrorrhagia  PROCEDURE:  Procedure(s): TOTAL ABDOMINAL HYSTERECTOMY WITH  BILATERAL SALPINGECTOMY  SURGEON:  Surgeon(s): [name], MD Fontaine, Belinda Block, MD  ANESTHESIA:   general  FINDINGS: Large uterus with fibroids.  Normal tubes.  Normal ovaries.  DESCRIPTION OF OPERATION: Under general anesthesia with endotracheal intubation the patient is in decubitus dorsal.  She has prepped with DuraPrep on the abdomen and Betadine on the suprapubic, and vulvar area.  The Foley is inserted in the bladder.  The patient is draped as usual.  Timeout is done.  Marcaine 1/4% 10 cc is infiltrated at the site of incision.  A Pfannenstiel incision is made with a scalpel.  The adipose tissue was opened with the electrocautery.  The aponeurosis is open transversely with the electrocautery and the Mayo scissors.  The recti muscles are separated from the aponeurosis on the midline superiorly and inferiorly.  The parietal peritoneum is open longitudinally with Metzenbaums scissors.  The uterus is exteriorized.  The uterus is very enlarged with fibroids.  Both tubes are normal and both ovaries are normal in size and appearance.  The Balfour retractor was put in place with 3 laps superiorly to retract the bowels and the bladder blade anteriorly.  We started on the left side with section by cauterization of the left round ligament.  The visceral peritoneum is opened anteriorly and we start retracting the bladder downward.  We then opened a window in the left broad ligament.  We clamp the left utero-ovarian ligament and proximal left tube with curved Heaney.  A straight clamp is used proximally to prevent backflow.  We section in between with Mayo scissors.  We suture with a Vicryl 0 stitch and a free tie.  We  proceed the same way on the right side.  We then go down the left side close to the uterus and the broad ligament.  We clamped section and suture.  We then clamped the left uterine artery with a straight Heaney, we section with Mayo scissors and we suture with Vicryl 0.  We proceeded the same way on the right side.  We then descends along the cervix on the left side using straight Heaneys, we clamp, we section with the scalpel and suture with Heaney stitches of Vicryl 0.  We proceed the same way on the right side until we reached the angle of the vagina.  We then used curved Heaneys on each side to clamp we section with Mayo scissors and suture with a Heaney stitch of Vicryl 0 on each side that we keep on a snap.  We then completely detached the uterus.  The uterus with cervix is removed from the abdomen.  We then complete the closure of the vagina with X stitches of Vicryl 0.  We then proceeded with left salpingectomy by clamping the left mesosalpinx resection with Mayo scissors and cauterized with the Bovie.  On the right side we clamped the right mesosalpinx we section with Mayo scissors and sutured with a Vicryl 0.  Both tubes are sent to pathology with the uterus and cervix.  We irrigate and suction the pelvic cavity.  Hemostasis is adequate at all levels.  The 3 laps and Balfour retractor are removed.  We verify hemostasis and the recti muscles and it is completed with the electrocautery.  We closed  the aponeurosis with 2 half running sutures of Vicryl 0.  We reapproximated the adipose tissue with separate stitches of plain.  The skin was closed with a subcu cuticular suture of Vicryl 4-0.  Dermabond was added.  Hemostasis was adequate at that level as well.  The patient was brought to recovery room in good and stable status.  ESTIMATED BLOOD LOSS: 50 mL   Intake/Output Summary (Last 24 hours) at 11/15/2018 0857 Last data filed at 11/15/2018 0825 Gross per 24 hour  Intake -  Output 100 ml  Net -100 ml      BLOOD ADMINISTERED:none   LOCAL MEDICATIONS USED:  MARCAINE 1/4 plain and Exparel  SPECIMEN:  Source of Specimen:  Uterus with cervix and bilateral tubes  DISPOSITION OF SPECIMEN:  PATHOLOGY  COUNTS:  YES  PLAN OF CARE: Transfer to PACU  Marie-Lyne LavoieMD8:57 AM

## 2018-11-16 ENCOUNTER — Encounter (HOSPITAL_BASED_OUTPATIENT_CLINIC_OR_DEPARTMENT_OTHER): Payer: Self-pay | Admitting: Obstetrics & Gynecology

## 2018-11-16 DIAGNOSIS — D259 Leiomyoma of uterus, unspecified: Secondary | ICD-10-CM | POA: Diagnosis not present

## 2018-11-16 LAB — CBC
HCT: 37.9 % (ref 36.0–46.0)
Hemoglobin: 11.6 g/dL — ABNORMAL LOW (ref 12.0–15.0)
MCH: 25.4 pg — ABNORMAL LOW (ref 26.0–34.0)
MCHC: 30.6 g/dL (ref 30.0–36.0)
MCV: 82.9 fL (ref 80.0–100.0)
Platelets: 283 10*3/uL (ref 150–400)
RBC: 4.57 MIL/uL (ref 3.87–5.11)
RDW: 14.6 % (ref 11.5–15.5)
WBC: 8.8 10*3/uL (ref 4.0–10.5)
nRBC: 0 % (ref 0.0–0.2)

## 2018-11-16 MED ORDER — OXYCODONE-ACETAMINOPHEN 5-325 MG PO TABS
ORAL_TABLET | ORAL | Status: AC
  Filled 2018-11-16: qty 1

## 2018-11-16 MED ORDER — OXYCODONE-ACETAMINOPHEN 7.5-325 MG PO TABS: 1.0000 | ORAL_TABLET | ORAL | 0 refills | Status: DC | PRN

## 2018-11-16 MED ORDER — TRAMADOL HCL 50 MG PO TABS
ORAL_TABLET | ORAL | Status: AC
  Filled 2018-11-16: qty 1

## 2018-11-16 NOTE — Anesthesia Postprocedure Evaluation (Signed)
Anesthesia Post Note  Patient: Tricia Miranda  Procedure(s) Performed: HYSTERECTOMY ABDOMINAL WITH  BILATERAL SALPINGECTOMY (Bilateral )     Patient location during evaluation: PACU Anesthesia Type: General Level of consciousness: awake and alert Pain management: pain level controlled Vital Signs Assessment: post-procedure vital signs reviewed and stable Respiratory status: spontaneous breathing, nonlabored ventilation, respiratory function stable and patient connected to nasal cannula oxygen Cardiovascular status: blood pressure returned to baseline and stable Postop Assessment: no apparent nausea or vomiting Anesthetic complications: no    Last Vitals:  Vitals:   11/16/18 0835 11/16/18 1000  BP: 107/65 118/70  Pulse: 64 74  Resp: 16 16  Temp: 36.8 C 37 C  SpO2: 94% 93%    Last Pain:  Vitals:   11/16/18 0915  TempSrc:   PainSc: [place]

## 2018-11-17 ENCOUNTER — Telehealth: Payer: Self-pay

## 2018-11-17 NOTE — Telephone Encounter (Signed)
Patient had TAH 11/15/18.  She said she is staying in bedroom upstairs but wondered if okay if she use the steps to go downstairs.

## 2018-11-18 NOTE — Telephone Encounter (Signed)
Patient informed. 

## 2018-11-18 NOTE — Telephone Encounter (Signed)
Yes, no problem.  Go at her rhythm.

## 2018-12-01 ENCOUNTER — Encounter: Payer: Self-pay | Admitting: Obstetrics & Gynecology

## 2018-12-01 ENCOUNTER — Other Ambulatory Visit: Payer: Self-pay

## 2018-12-01 ENCOUNTER — Ambulatory Visit (INDEPENDENT_AMBULATORY_CARE_PROVIDER_SITE_OTHER): Payer: No Typology Code available for payment source | Admitting: Obstetrics & Gynecology

## 2018-12-01 VITALS — BP 128/76 | Ht 67.0 in | Wt 222.0 lb

## 2018-12-01 DIAGNOSIS — E6609 Other obesity due to excess calories: Secondary | ICD-10-CM

## 2018-12-01 DIAGNOSIS — Z09 Encounter for follow-up examination after completed treatment for conditions other than malignant neoplasm: Secondary | ICD-10-CM | POA: Diagnosis not present

## 2018-12-01 DIAGNOSIS — Z01419 Encounter for gynecological examination (general) (routine) without abnormal findings: Secondary | ICD-10-CM | POA: Diagnosis not present

## 2018-12-01 DIAGNOSIS — Z6834 Body mass index (BMI) 34.0-34.9, adult: Secondary | ICD-10-CM

## 2018-12-01 NOTE — Progress Notes (Signed)
Tricia Miranda 07-09-1965 [id number]   History:    53 y.o. G1P1L1 Married.  RP:  Established patient presenting for annual gyn exam and Postop TAH/Bilateral Salpingectomy 11/15/2018  HPI: TAH/Bilateral Salpingectomy 11/15/2018.  Good postop healing.  No abdominopelvic pain.  No vaginal bleeding.  No fever.  Urine/BMs normal.  Breasts normal.  BMI 34.77.  Health labs with Fam MD.  Past medical history,surgical history, family history and social history were all reviewed and documented in the EPIC chart.  Gynecologic History Patient's last menstrual period was 07/20/2018. Contraception: status post hysterectomy Last Pap: 11/2017. Results were: ASCUS/HPV HR neg Last mammogram: 01/2018. Results were: Negative Bone Density: Never Colonoscopy: 2018  Obstetric History OB History  Gravida Para Term Preterm AB Living  1 1       1   SAB TAB Ectopic Multiple Live Births               # Outcome Date GA Lbr Len/2nd Weight Sex Delivery Anes PTL Lv  1 Para              ROS: A ROS was performed and pertinent positives and negatives are included in the history.  GENERAL: No fevers or chills. HEENT: No change in vision, no earache, sore throat or sinus congestion. NECK: No pain or stiffness. CARDIOVASCULAR: No chest pain or pressure. No palpitations. PULMONARY: No shortness of breath, cough or wheeze. GASTROINTESTINAL: No abdominal pain, nausea, vomiting or diarrhea, melena or bright red blood per rectum. GENITOURINARY: No urinary frequency, urgency, hesitancy or dysuria. MUSCULOSKELETAL: No joint or muscle pain, no back pain, no recent trauma. DERMATOLOGIC: No rash, no itching, no lesions. ENDOCRINE: No polyuria, polydipsia, no heat or cold intolerance. No recent change in weight. HEMATOLOGICAL: No anemia or easy bruising or bleeding. NEUROLOGIC: No headache, seizures, numbness, tingling or weakness. PSYCHIATRIC: No depression, no loss of interest in normal activity or change in sleep pattern.      Exam:   BP 128/76   Ht 5\' 7"  (1.702 m)   Wt 222 lb (100.7 kg)   LMP 07/20/2018   BMI 34.77 kg/m   Body mass index is 34.77 kg/m.  General appearance : Well developed well nourished female. No acute distress HEENT: Eyes: no retinal hemorrhage or exudates,  Neck supple, trachea midline, no carotid bruits, no thyroidmegaly Lungs: Clear to auscultation, no rhonchi or wheezes, or rib retractions  Heart: Regular rate and rhythm, no murmurs or gallops Breast:Examined in sitting and supine position were symmetrical in appearance, no palpable masses or tenderness,  no skin retraction, no nipple inversion, no nipple discharge, no skin discoloration, no axillary or supraclavicular lymphadenopathy Abdomen: no palpable masses or tenderness, no rebound or guarding Extremities: no edema or skin discoloration or tenderness  Pelvic: Vulva: Normal             Vagina: No gross lesions or discharge.  Vaginal vault well healed  Cervix/Uterus absent  Adnexa  Without masses or tenderness  Anus: Normal  Diagnosis  Uterus, cervix and bilateral fallopian tubes  - UTERUS:  -ENDOMETRIUM: INACTIVE ENDOMETRIUM. NO HYPERPLASIA OR MALIGNANCY.  -MYOMETRIUM: LEIOMYOMATA. NO MALIGNANCY.  -SEROSA: UNREMARKABLE. NO MALIGNANCY.  - CERVIX: BENIGN SQUAMOUS AND ENDOCERVICAL MUCOSA. NO DYSPLASIA OR MALIGNANCY.  - BILATERAL FALLOPIAN TUBES: PARATUBAL CYSTS. NO MALIGNANCY.   Assessment/Plan:  53 y.o. female for annual exam   1. Well female exam with routine gynecological exam Gynecologic exam post total hysterectomy.  Pathology on the cervix was completely benign November 15, 2018.  Breast exam normal.  Last screening mammogram October 2019 was negative.  Health labs with family physician.  Colonoscopy in 2018.  2. Status post gynecological surgery, follow-up exam Good healing post TAH with bilateral salpingectomy November 15, 2018.  No complication.  Postop precautions and progressive increase in physical activities  reviewed.  No intercourse until 8 weeks postop.  3. Class 1 obesity due to excess calories without serious comorbidity with body mass index (BMI) of 34.0 to 34.9 in adult Recommend a lower calorie/carb diet such as [employer].  Physical activity to increase at 8 weeks postop with aerobic physical activities 5 times a week and weightlifting every 2 days.  [name] MD, 4:24 PM 12/01/2018

## 2018-12-05 NOTE — Patient Instructions (Signed)
1. Well female exam with routine gynecological exam Gynecologic exam post total hysterectomy.  Pathology on the cervix was completely benign November 15, 2018.  Breast exam normal.  Last screening mammogram October 2019 was negative.  Health labs with family physician.  Colonoscopy in 2018.  2. Status post gynecological surgery, follow-up exam Good healing post TAH with bilateral salpingectomy November 15, 2018.  No complication.  Postop precautions and progressive increase in physical activities reviewed.  No intercourse until 8 weeks postop.  3. Class 1 obesity due to excess calories without serious comorbidity with body mass index (BMI) of 34.0 to 34.9 in adult Recommend a lower calorie/carb diet such as [employer].  Physical activity to increase at 8 weeks postop with aerobic physical activities 5 times a week and weightlifting every 2 days.  Tricia Miranda, it was a pleasure seeing you today!

## 2018-12-07 IMAGING — CR DG CHEST 2V
2 series · 2 of 2 positions shown · non-contrast
Comparison: Chest x-ray of 09/23/2015

CLINICAL DATA: Posterior chest pain below the scapula for 2 weeks,
positive for flu

EXAM:
CHEST  2 VIEW

[w chest pa]
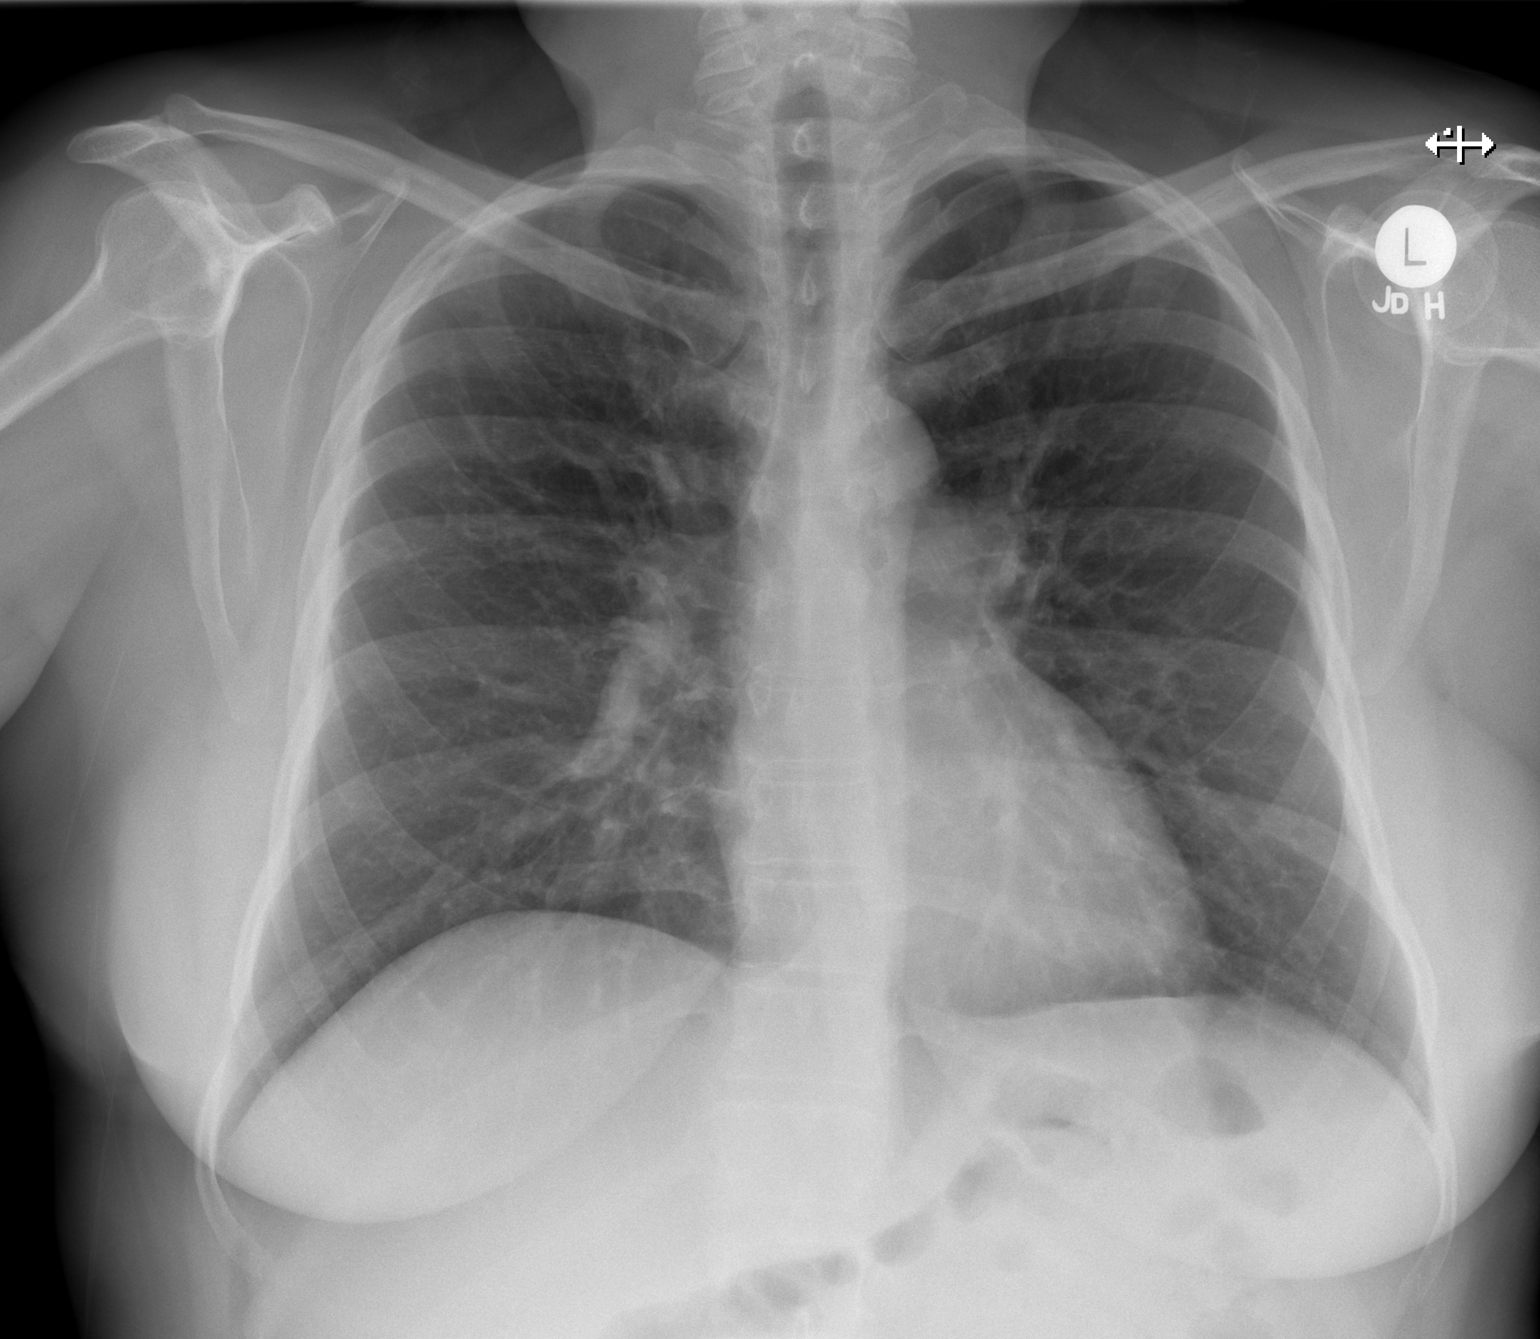

[w chest lat]
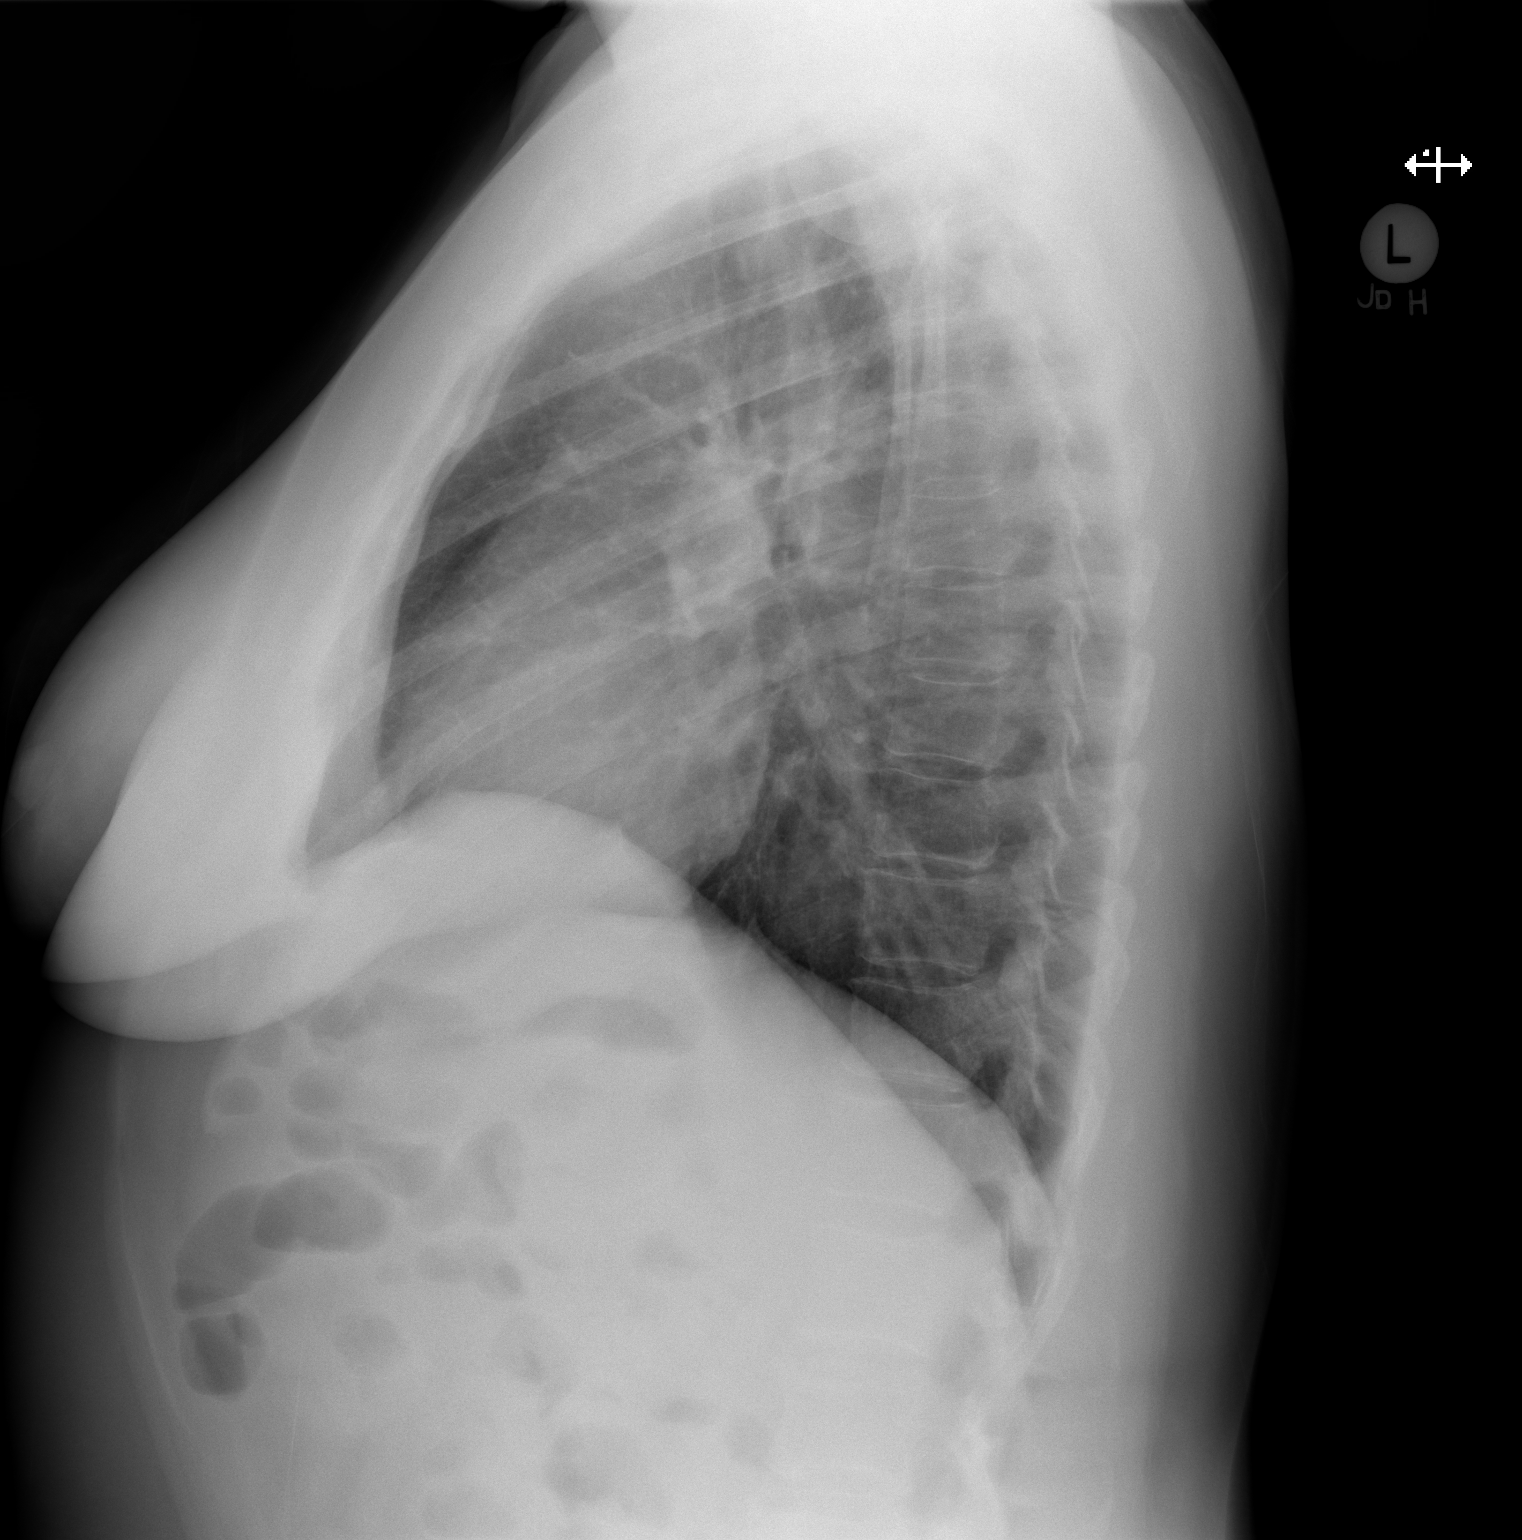

[2 of 2 positions shown; findings below may reference images not displayed]

FINDINGS: No active infiltrate or effusion is seen. Mediastinal and hilar
contours are unremarkable. The heart is within normal limits in
size. No bony abnormality is seen.
IMPRESSION: No active cardiopulmonary disease.

## 2018-12-13 ENCOUNTER — Other Ambulatory Visit: Payer: Self-pay | Admitting: Obstetrics & Gynecology

## 2018-12-16 ENCOUNTER — Other Ambulatory Visit: Payer: Self-pay | Admitting: Internal Medicine

## 2019-01-04 ENCOUNTER — Encounter: Payer: Self-pay | Admitting: Obstetrics & Gynecology

## 2019-01-04 ENCOUNTER — Ambulatory Visit (INDEPENDENT_AMBULATORY_CARE_PROVIDER_SITE_OTHER): Payer: No Typology Code available for payment source | Admitting: Obstetrics & Gynecology

## 2019-01-04 ENCOUNTER — Other Ambulatory Visit: Payer: Self-pay

## 2019-01-04 VITALS — BP 128/86

## 2019-01-04 DIAGNOSIS — M170 Bilateral primary osteoarthritis of knee: Secondary | ICD-10-CM

## 2019-01-04 DIAGNOSIS — Z23 Encounter for immunization: Secondary | ICD-10-CM

## 2019-01-04 DIAGNOSIS — Z09 Encounter for follow-up examination after completed treatment for conditions other than malignant neoplasm: Secondary | ICD-10-CM

## 2019-01-04 NOTE — Progress Notes (Signed)
    Tricia Miranda [birth year] [id number]        53 y.o.  G1P1L1 Married  RP: Postop TAH/Bilateral Salpingectomy 11/15/2018  HPI: Good postop recovery.  No abdominal pelvic pain.  No vaginal discharge.  No vaginal bleeding.  No fever.  Urine and bowel movements normal.   OB History  Gravida Para Term Preterm AB Living  1 1       1   SAB TAB Ectopic Multiple Live Births               # Outcome Date GA Lbr Len/2nd Weight Sex Delivery Anes PTL Lv  1 Para             Past medical history,surgical history, problem list, medications, allergies, family history and social history were all reviewed and documented in the EPIC chart.   Directed ROS with pertinent positives and negatives documented in the history of present illness/assessment and plan.  Exam:  Vitals:   01/04/19 1033  BP: 128/86   General appearance:  Normal  Abdomen: Incision well healed.  Gynecologic exam: Vulva normal.  Bimanual exam: Vaginal vault well healed, closed, no blood, no vaginal discharge.  No pelvic mass, NT.   Diagnosis  Uterus, cervix and bilateral fallopian tubes  - UTERUS:  -ENDOMETRIUM: INACTIVE ENDOMETRIUM. NO HYPERPLASIA OR MALIGNANCY.  -MYOMETRIUM: LEIOMYOMATA. NO MALIGNANCY.  -SEROSA: UNREMARKABLE. NO MALIGNANCY.  - CERVIX: BENIGN SQUAMOUS AND ENDOCERVICAL MUCOSA. NO DYSPLASIA OR MALIGNANCY.  - BILATERAL FALLOPIAN TUBES: PARATUBAL CYSTS. NO MALIGNANCY.          Assessment/Plan:  53 y.o. G1P1   1. Status post gynecological surgery, follow-up exam Very good postop recovery.  Normal postop exam with no complication.  May resume all physical activities progressively after evaluation of her knees by the orthopedist.  May also resume sexual activities.  2. Flu vaccine need Flu shot given.  3. Arthritis of both knees Referred to orthopedist.  Patient would like to do physical therapy and improve her fitness at the gym after evaluation of her knees.  Other orders - Flu Vaccine QUAD 36+  mos IM (Fluarix, Quad PF)  Counseling on above issues and coordination of care more than 50% for 10 minutes.  [name] MD, 10:43 AM 01/04/2019

## 2019-01-04 NOTE — Patient Instructions (Signed)
1. Status post gynecological surgery, follow-up exam Very good postop recovery.  Normal postop exam with no complication.  May resume all physical activities progressively after evaluation of her knees by the orthopedist.  May also resume sexual activities.  2. Flu vaccine need Flu shot given.  3. Arthritis of both knees Referred to orthopedist.  Patient would like to do physical therapy and improve her fitness at the gym after evaluation of her knees.  Other orders - Flu Vaccine QUAD 36+ mos IM (Fluarix, Quad PF)  Tricia Miranda, it was a pleasure seeing you today!

## 2019-01-05 ENCOUNTER — Telehealth: Payer: Self-pay | Admitting: *Deleted

## 2019-01-05 ENCOUNTER — Ambulatory Visit: Payer: No Typology Code available for payment source | Admitting: Obstetrics & Gynecology

## 2019-01-05 DIAGNOSIS — M170 Bilateral primary osteoarthritis of knee: Secondary | ICD-10-CM

## 2019-01-05 NOTE — Telephone Encounter (Signed)
-----   Message from [name], MD sent at 01/04/2019 10:57 AM EDT ----- Regarding: Refer to Orthopedist Knees "Bone to Bone".  Evaluation and management.

## 2019-01-05 NOTE — Telephone Encounter (Addendum)
Referral placed at [facility] they will call to schedule. Patient aware referral placed and to look out for a call from them.

## 2019-01-12 NOTE — Telephone Encounter (Signed)
Patient scheduled on 01/18/19 9:30AM with Dr.Gregory Marlou Sa

## 2019-01-18 ENCOUNTER — Ambulatory Visit: Payer: Self-pay

## 2019-01-18 ENCOUNTER — Telehealth: Payer: Self-pay

## 2019-01-18 ENCOUNTER — Encounter: Payer: Self-pay | Admitting: Orthopedic Surgery

## 2019-01-18 ENCOUNTER — Ambulatory Visit (INDEPENDENT_AMBULATORY_CARE_PROVIDER_SITE_OTHER): Payer: No Typology Code available for payment source | Admitting: Orthopedic Surgery

## 2019-01-18 DIAGNOSIS — M1711 Unilateral primary osteoarthritis, right knee: Secondary | ICD-10-CM | POA: Diagnosis not present

## 2019-01-18 DIAGNOSIS — M1712 Unilateral primary osteoarthritis, left knee: Secondary | ICD-10-CM | POA: Diagnosis not present

## 2019-01-18 DIAGNOSIS — M25561 Pain in right knee: Secondary | ICD-10-CM

## 2019-01-18 DIAGNOSIS — M25562 Pain in left knee: Secondary | ICD-10-CM

## 2019-01-18 NOTE — Telephone Encounter (Signed)
Can you get patient approved for right knee gel injection?

## 2019-01-18 NOTE — Progress Notes (Signed)
Office Visit Note   Patient: Tricia Miranda           Date of Birth: 02/17/1966           MRN: [id number] Visit Date: 01/18/2019 Requested by: [name], [address],  [place] [address] PCP: Elby Showers, MD  Subjective: Chief Complaint  Patient presents with  . Right Knee - Pain  . Left Knee - Pain    HPI: Tricia Miranda is a 53 y.o. female who presents to the office complaining of bilateral knee pain.  Patient notes 14 month history of intermittent bilateral knee pain, right greater than left.  Patient states she has been diagnosed with knee osteoarthritis about a year ago after acute onset of pain following a fall.  She was previously treated at [facility] where she was given a cortisone injection with some relief.  She has tried anti-inflammatory medications which has provided good relief.  She takes Mobic as needed only taking it about 2 times per month.  Her pain is worse with activity, especially stairs and hiking.  However her pain does not bother her all the time.  She denies any mechanical symptoms or groin pain or low back pain.  She localizes the pain to the medial aspect of her bilateral knees.              ROS:  All systems reviewed are negative as they relate to the chief complaint within the history of present illness.  Patient denies fevers or chills.  Assessment & Plan: Visit Diagnoses:  1. Unilateral primary osteoarthritis, right knee   2. Pain in both knees, unspecified chronicity   3. Unilateral primary osteoarthritis, left knee     Plan: Patient is a 53 year old female who presents with 68-month history of intermittent mild to moderate bilateral knee pain, worse on the right.  X-rays reviewed which revealed moderate medial compartment osteoarthritis of the right knee and mild medial compartment osteoarthritis of the left knee.  Impression is bilateral knee osteoarthritis.  Provided referral for physical therapy for patient to work on  strengthening of the musculature surrounding the knees.  Pre-approve for gel injections for the future.  Since patient is not having much constant pain right now, I do not think she would have much benefit from a cortisone injection at this time.  However reassured her that she may return to the clinic any time that her pain becomes significantly worse in order to try and hit the reset button on her pain.  Patient understands and agrees with the plan.  She will follow-up in 3 months.  This patient is diagnosed with osteoarthritis of the knee(s).    Radiographs show evidence of joint space narrowing, osteophytes, subchondral sclerosis and/or subchondral cysts.  This patient has knee pain which interferes with functional and activities of daily living.    This patient has experienced inadequate response, adverse effects and/or intolerance with conservative treatments such as acetaminophen, NSAIDS, topical creams, physical therapy or regular exercise, knee bracing and/or weight loss.   This patient has experienced inadequate response or has a contraindication to intra articular steroid injections for at least 3 months.   This patient is not scheduled to have a total knee replacement within 6 months of starting treatment with viscosupplementation.   Follow-Up Instructions: No follow-ups on file.   Orders:  Orders Placed This Encounter  Procedures  . XR KNEE 3 VIEW RIGHT  . XR KNEE 3 VIEW LEFT  No orders of the defined types were placed in this encounter.     Procedures: No procedures performed   Clinical Data: No additional findings.  Objective: Vital Signs: LMP 11/15/2018   Physical Exam:  Constitutional: Patient appears well-developed HEENT:  Head: Normocephalic Eyes:EOM are normal Neck: Normal range of motion Cardiovascular: Normal rate Pulmonary/chest: Effort normal Neurologic: Patient is alert Skin: Skin is warm Psychiatric: Patient has normal mood and affect  Ortho  Exam:  Bilateral knee Exam No effusion Extensor mechanism intact No TTP over the lateral jointlines, quad tendon, patellar tendon, pes anserinus, patella, tibial tubercle, LCL/MCL insertions Mild TTP over the bilateral medial joint lines Stable to varus/valgus stresses.  Stable to anterior/posterior drawer Extension to 0 degrees Flexion > 90 degrees  Specialty Comments:  No specialty comments available.  Imaging: No results found.   PMFS History: Patient Active Problem List   Diagnosis Date Noted  . Postoperative state 11/15/2018  . Post-operative state 11/15/2018  . Snoring 11/23/2017  . Diarrhea 06/23/2012  . History of vitamin D deficiency 11/14/2011  . Essential tremor 11/14/2011  . Hypothyroidism 10/09/2010  . Anxiety states 10/09/2010  . Migraine 10/09/2010   Past Medical History:  Diagnosis Date  . Anxiety   . Bronchitis   . Cancer ([place])    basal cell- tiny  . Fibroids    uterine   . Hypothyroidism   . Sleep apnea    uses oral device for mild sleep apnea   . Thyroid disease    hypothyroidism    Family History  Problem Relation Age of Onset  . Heart disease Father   . Alcohol abuse Father   . Stomach cancer Other   . Colon cancer Neg Hx   . Esophageal cancer Neg Hx   . Pancreatic cancer Neg Hx   . Prostate cancer Neg Hx   . Rectal cancer Neg Hx     Past Surgical History:  Procedure Laterality Date  . ABDOMINAL HYSTERECTOMY    . COLONOSCOPY     with benign  polyp removal   . DILITATION & CURRETTAGE/HYSTROSCOPY WITH VERSAPOINT RESECTION N/A 03/16/2014   Procedure: DILATATION & CURETTAGE/HYSTEROSCOPY WITH VERSAPOINT RESECTION;  Surgeon: [name], MD;  Location: [facility];  Service: Gynecology;  Laterality: N/A;  1 hr.  . HYSTERECTOMY ABDOMINAL WITH SALPINGECTOMY Bilateral 11/15/2018   Procedure: HYSTERECTOMY ABDOMINAL WITH  BILATERAL SALPINGECTOMY;  Surgeon: [name], MD;  Location: [facility];  Service: Gynecology;   Laterality: Bilateral;  . MOHS SURGERY Left a while- unknown   under left eyelid  . WISDOM TOOTH EXTRACTION     Social History   Occupational History  . Not on file  Tobacco Use  . Smoking status: Never Smoker  . Smokeless tobacco: Never Used  Substance and Sexual Activity  . Alcohol use: Yes    Alcohol/week: 3.0 standard drinks    Types: 3 Standard drinks or equivalent per week    Comment: wine  with dinner occasionally or beer  . Drug use: No  . Sexual activity: Yes    Partners: Male    Comment: 1st intercourse- 58, partners- 85, married- 64 yrs

## 2019-01-19 NOTE — Telephone Encounter (Signed)
Noted  

## 2019-01-24 ENCOUNTER — Telehealth: Payer: Self-pay

## 2019-01-24 NOTE — Telephone Encounter (Signed)
Submitted VOB for Durolane, right knee. 

## 2019-01-30 ENCOUNTER — Telehealth: Payer: Self-pay

## 2019-01-30 ENCOUNTER — Other Ambulatory Visit: Payer: Self-pay

## 2019-01-30 NOTE — Telephone Encounter (Signed)
Received a fax from [facility] stating that Durolane, right knee was denied due not trying/failing preferred product such as Gel-One, Gelsyn-3, Supartz FX and Visco-3.  Will submit for preferred product Gel-One.

## 2019-01-30 NOTE — Telephone Encounter (Signed)
Error

## 2019-01-31 ENCOUNTER — Telehealth: Payer: Self-pay

## 2019-01-31 NOTE — Telephone Encounter (Signed)
Submitted VOB for Gel-One, right knee. 

## 2019-02-03 ENCOUNTER — Telehealth: Payer: Self-pay

## 2019-02-03 NOTE — Telephone Encounter (Signed)
Talked with patient about scheduling appointment for gel injection.  Patient stated that she would like to hold off for right now, she just started PT and would like to see how PT goes.  Will call back when she is ready to proceed.  Approved for Gel-One, right knee. [place] has been met Patient will be responsible for 15% OOP. No Co-pay PA required PA Approval# [id number] Valid 02/02/2019- 02/02/2020

## 2019-03-19 ENCOUNTER — Other Ambulatory Visit: Payer: Self-pay | Admitting: Internal Medicine

## 2019-05-02 ENCOUNTER — Other Ambulatory Visit: Payer: Self-pay

## 2019-05-02 ENCOUNTER — Other Ambulatory Visit: Payer: No Typology Code available for payment source | Admitting: Internal Medicine

## 2019-05-02 DIAGNOSIS — E039 Hypothyroidism, unspecified: Secondary | ICD-10-CM

## 2019-05-02 DIAGNOSIS — Z0000 Encounter for general adult medical examination without abnormal findings: Secondary | ICD-10-CM

## 2019-05-02 DIAGNOSIS — E669 Obesity, unspecified: Secondary | ICD-10-CM

## 2019-05-02 DIAGNOSIS — I10 Essential (primary) hypertension: Secondary | ICD-10-CM

## 2019-05-02 DIAGNOSIS — E559 Vitamin D deficiency, unspecified: Secondary | ICD-10-CM

## 2019-05-02 DIAGNOSIS — G4730 Sleep apnea, unspecified: Secondary | ICD-10-CM

## 2019-05-02 DIAGNOSIS — D259 Leiomyoma of uterus, unspecified: Secondary | ICD-10-CM

## 2019-05-02 DIAGNOSIS — G250 Essential tremor: Secondary | ICD-10-CM

## 2019-05-05 ENCOUNTER — Other Ambulatory Visit: Payer: Self-pay

## 2019-05-05 ENCOUNTER — Ambulatory Visit (INDEPENDENT_AMBULATORY_CARE_PROVIDER_SITE_OTHER): Payer: No Typology Code available for payment source | Admitting: Internal Medicine

## 2019-05-05 ENCOUNTER — Encounter: Payer: Self-pay | Admitting: Internal Medicine

## 2019-05-05 VITALS — BP 100/80 | HR 66 | Temp 98.0°F | Ht 67.0 in | Wt 225.0 lb

## 2019-05-05 DIAGNOSIS — F419 Anxiety disorder, unspecified: Secondary | ICD-10-CM

## 2019-05-05 DIAGNOSIS — Z0000 Encounter for general adult medical examination without abnormal findings: Secondary | ICD-10-CM | POA: Diagnosis not present

## 2019-05-05 DIAGNOSIS — M25561 Pain in right knee: Secondary | ICD-10-CM

## 2019-05-05 DIAGNOSIS — G250 Essential tremor: Secondary | ICD-10-CM

## 2019-05-05 DIAGNOSIS — E7800 Pure hypercholesterolemia, unspecified: Secondary | ICD-10-CM | POA: Diagnosis not present

## 2019-05-05 DIAGNOSIS — E538 Deficiency of other specified B group vitamins: Secondary | ICD-10-CM

## 2019-05-05 DIAGNOSIS — G8929 Other chronic pain: Secondary | ICD-10-CM

## 2019-05-05 DIAGNOSIS — R5381 Other malaise: Secondary | ICD-10-CM

## 2019-05-05 DIAGNOSIS — R319 Hematuria, unspecified: Secondary | ICD-10-CM | POA: Diagnosis not present

## 2019-05-05 DIAGNOSIS — E559 Vitamin D deficiency, unspecified: Secondary | ICD-10-CM

## 2019-05-05 DIAGNOSIS — R8290 Unspecified abnormal findings in urine: Secondary | ICD-10-CM

## 2019-05-05 DIAGNOSIS — Z8669 Personal history of other diseases of the nervous system and sense organs: Secondary | ICD-10-CM

## 2019-05-05 DIAGNOSIS — E039 Hypothyroidism, unspecified: Secondary | ICD-10-CM

## 2019-05-05 LAB — POCT URINALYSIS DIPSTICK
Appearance: NEGATIVE
Bilirubin, UA: NEGATIVE
Glucose, UA: NEGATIVE
Ketones, UA: NEGATIVE
Leukocytes, UA: NEGATIVE
Nitrite, UA: NEGATIVE
Protein, UA: NEGATIVE
Spec Grav, UA: 1.01 (ref 1.010–1.025)
Urobilinogen, UA: 0.2 E.U./dL
pH, UA: 6.5 (ref 5.0–8.0)

## 2019-05-05 MED ORDER — ERGOCALCIFEROL 1.25 MG (50000 UT) PO CAPS: 50000.0000 [IU] | ORAL_CAPSULE | ORAL | 0 refills | Status: DC

## 2019-05-05 MED ORDER — ALBUTEROL SULFATE HFA 108 (90 BASE) MCG/ACT IN AERS: 2.0000 | INHALATION_SPRAY | Freq: Four times a day (QID) | RESPIRATORY_TRACT | 11 refills | Status: DC | PRN

## 2019-05-05 MED ORDER — ROSUVASTATIN CALCIUM 5 MG PO TABS: ORAL_TABLET | ORAL | 3 refills | Status: DC

## 2019-05-05 MED ORDER — MELOXICAM 15 MG PO TABS: 15.0000 mg | ORAL_TABLET | Freq: Every day | ORAL | 1 refills | Status: DC

## 2019-05-05 NOTE — Progress Notes (Signed)
Subjective:    Patient ID: Tricia Miranda, female    DOB: Jan 03, 1966, 54 y.o.   MRN: [id number]  54 year old female in today for health maintenance exam and evaluation of medical issues.  Has osteoarthritis in right knee and is seeing Dr. Alfonso Ramus for steroid injection in the remote past and he has given her small quantity of meloxicam.  Underwent abdominal hysterectomy with BSO July 202 200.Marland Kitchen  Has done well since that time.  History of vitamin D deficiency treated with normal vitamin D supplement.  History of sleep apnea treated with an oral appliance.  History of hypothyroidism, anxiety, basal cell carcinoma, essential tremor, hyperlipidemia treated with statin medication, fibrocystic breast disease and history of migraine headaches.  History of obesity.  Past medical history: Right comminuted humerus fracture October 2007.  Left fibula fracture March 2009.  Number of years ago she was living in [place] and developed a fine tremor.  Had neurology evaluation including MRI of the brain and nerve conduction testing.  Fine tremor never progressed.  There was a punctate focus of T2 hypersensitivity in the superior left frontal white matter scribed is nonspecific.  Question of course of MS was raised but she has noted symptoms of MS.  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  She is married to an attorney who teaches law at [employer].  She has 1 son.  Does not smoke.  Social alcohol consumption.  Does not get much exercise.  Family history: Father with history of MI and alcoholism.  Mother in good health.  1 sister with history of hip dysplasia.  Maternal grandmother with history of adult onset diabetes.  Review of Systems see above  Fasting glucose is 103, she would like B12 level checked and it is within normal limits.  BUN and creatinine are normal.  Is not taking statin medication.  Total cholesterol was 251 and LDL cholesterol 164.  TSH  is normal on thyroid replacement.  Vitamin D level is low at 29.     Objective:   Physical Exam Blood pressure 100/80 pulse 66 temperature 98 degrees height 5 feet 7 inches weight 225 pounds BMI is 35.24  Skin warm and dry.  Nodes none.  Pharynx not examined due to pandemic.  TMs are clear.  Neck is supple without JVD thyromegaly or carotid bruits.  Chest clear to auscultation.  Breast without masses.  Cardiac exam regular rate and rhythm normal S1 and S2 without murmurs or gallops.  Abdomen is obese soft nondistended without hepatosplenomegaly masses or tenderness.  Pelvic exam was not done status post total abdominal hysterectomy BSO by GYN physician recently.  No lower extremity edema.  Neuro no gross focal deficits on brief neurological exam.  Affect is normal as is judgment and thought.       Assessment & Plan:  Osteoarthritis right knee  Hypothyroidism-stable on thyroid replacement therapy  Hyperlipidemia-needs to restart statin medication and follow-up in 3 months  BMI is elevated at 37-needs to diet Sometimes works on working on ciprofloxacin outside all joint joint: Joint.  Some water yesterday morning shows exercise and lose weight.  History of anxiety treated with Xanax for many years  Very fine resting tremor not very noticeable and longstanding  Vitamin D deficiency-will be taking vitamin D weekly for 3 months then over-the-counter supplement.  Deconditioning-being referred to [facility]  Plan: She will restart her statin and follow-up with hyperlipidemia and weight check in April.  Urine culture is negative.  She will make sure to take vitamin D supplement regularly.  Needs to diet exercise and lose weight.

## 2019-05-05 NOTE — Patient Instructions (Addendum)
Refilled Mobic for prn use for knee pain. Check B12 level on oral B12. It was added to labs already drawn. High dose Vitamin D x 12 weeks then recheck level. Crestor 5 mg twice weekly and recheck in 3 months. Refer to [facility] for deconditioning.

## 2019-05-06 LAB — URINALYSIS, MICROSCOPIC ONLY
Bacteria, UA: NONE SEEN /HPF
Hyaline Cast: NONE SEEN /LPF
WBC, UA: NONE SEEN /HPF (ref 0–5)

## 2019-05-06 LAB — URINE CULTURE
MICRO NUMBER:: 10046968
Result:: NO GROWTH
SPECIMEN QUALITY:: ADEQUATE

## 2019-05-19 LAB — HM MAMMOGRAPHY

## 2019-05-22 ENCOUNTER — Encounter: Payer: Self-pay | Admitting: Internal Medicine

## 2019-06-12 LAB — CBC WITH DIFFERENTIAL/PLATELET
Absolute Monocytes: 410 cells/uL (ref 200–950)
Basophils Absolute: 58 cells/uL (ref 0–200)
Basophils Relative: 0.9 %
Eosinophils Absolute: 179 cells/uL (ref 15–500)
Eosinophils Relative: 2.8 %
HCT: 41.8 % (ref 35.0–45.0)
Hemoglobin: 13.9 g/dL (ref 11.7–15.5)
Lymphs Abs: 2010 cells/uL (ref 850–3900)
MCH: 27.8 pg (ref 27.0–33.0)
MCHC: 33.3 g/dL (ref 32.0–36.0)
MCV: 83.6 fL (ref 80.0–100.0)
MPV: 9.8 fL (ref 7.5–12.5)
Monocytes Relative: 6.4 %
Neutro Abs: 3744 cells/uL (ref 1500–7800)
Neutrophils Relative %: 58.5 %
Platelets: 301 10*3/uL (ref 140–400)
RBC: 5 10*6/uL (ref 3.80–5.10)
RDW: 12.8 % (ref 11.0–15.0)
Total Lymphocyte: 31.4 %
WBC: 6.4 10*3/uL (ref 3.8–10.8)

## 2019-06-12 LAB — TSH: TSH: 2.45 mIU/L

## 2019-06-12 LAB — LIPID PANEL
Cholesterol: 251 mg/dL — ABNORMAL HIGH (ref <200)
HDL: 63 mg/dL (ref >=50)
LDL Cholesterol (Calc): 164 mg/dL (calc) — ABNORMAL HIGH
Non-HDL Cholesterol (Calc): 188 mg/dL (calc) — ABNORMAL HIGH (ref <130)
Total CHOL/HDL Ratio: 4 (calc) (ref <5.0)
Triglycerides: 120 mg/dL (ref <150)

## 2019-06-12 LAB — COMPLETE METABOLIC PANEL WITH GFR
AG Ratio: 1.8 (calc) (ref 1.0–2.5)
ALT: 20 U/L (ref 6–29)
AST: 15 U/L (ref 10–35)
Albumin: 4.4 g/dL (ref 3.6–5.1)
Alkaline phosphatase (APISO): 99 U/L (ref 37–153)
BUN: 17 mg/dL (ref 7–25)
CO2: 27 mmol/L (ref 20–32)
Calcium: 9.7 mg/dL (ref 8.6–10.4)
Chloride: 101 mmol/L (ref 98–110)
Creat: 0.67 mg/dL (ref 0.50–1.05)
GFR, Est African American: 116 mL/min/{1.73_m2} (ref >=60)
GFR, Est Non African American: 100 mL/min/{1.73_m2} (ref >=60)
Globulin: 2.5 g/dL (calc) (ref 1.9–3.7)
Glucose, Bld: 103 mg/dL — ABNORMAL HIGH (ref 65–99)
Potassium: 4.4 mmol/L (ref 3.5–5.3)
Sodium: 137 mmol/L (ref 135–146)
Total Bilirubin: 0.5 mg/dL (ref 0.2–1.2)
Total Protein: 6.9 g/dL (ref 6.1–8.1)

## 2019-06-12 LAB — TEST AUTHORIZATION

## 2019-06-12 LAB — VITAMIN D 25 HYDROXY (VIT D DEFICIENCY, FRACTURES): Vit D, 25-Hydroxy: 29 ng/mL — ABNORMAL LOW (ref 30–100)

## 2019-06-12 LAB — VITAMIN B12: Vitamin B-12: 704 pg/mL (ref 200–1100)

## 2019-06-22 ENCOUNTER — Other Ambulatory Visit: Payer: Self-pay | Admitting: Internal Medicine

## 2019-08-01 ENCOUNTER — Other Ambulatory Visit: Payer: No Typology Code available for payment source | Admitting: Internal Medicine

## 2019-08-04 ENCOUNTER — Ambulatory Visit: Payer: No Typology Code available for payment source | Admitting: Internal Medicine

## 2019-08-06 ENCOUNTER — Other Ambulatory Visit: Payer: Self-pay | Admitting: Internal Medicine

## 2019-08-22 ENCOUNTER — Other Ambulatory Visit (INDEPENDENT_AMBULATORY_CARE_PROVIDER_SITE_OTHER): Payer: No Typology Code available for payment source | Admitting: Internal Medicine

## 2019-08-22 ENCOUNTER — Other Ambulatory Visit: Payer: Self-pay

## 2019-08-22 DIAGNOSIS — E7800 Pure hypercholesterolemia, unspecified: Secondary | ICD-10-CM

## 2019-08-22 LAB — HEPATIC FUNCTION PANEL
AG Ratio: 1.8 (calc) (ref 1.0–2.5)
ALT: 24 U/L (ref 6–29)
AST: 16 U/L (ref 10–35)
Albumin: 4.4 g/dL (ref 3.6–5.1)
Alkaline phosphatase (APISO): 103 U/L (ref 37–153)
Bilirubin, Direct: 0.1 mg/dL (ref 0.0–0.2)
Globulin: 2.5 g/dL (calc) (ref 1.9–3.7)
Indirect Bilirubin: 0.4 mg/dL (calc) (ref 0.2–1.2)
Total Bilirubin: 0.5 mg/dL (ref 0.2–1.2)
Total Protein: 6.9 g/dL (ref 6.1–8.1)

## 2019-08-22 LAB — LIPID PANEL
Cholesterol: 209 mg/dL — ABNORMAL HIGH (ref <200)
HDL: 56 mg/dL (ref >=50)
LDL Cholesterol (Calc): 129 mg/dL (calc) — ABNORMAL HIGH
Non-HDL Cholesterol (Calc): 153 mg/dL (calc) — ABNORMAL HIGH (ref <130)
Total CHOL/HDL Ratio: 3.7 (calc) (ref <5.0)
Triglycerides: 125 mg/dL (ref <150)

## 2019-08-25 ENCOUNTER — Ambulatory Visit: Payer: No Typology Code available for payment source | Admitting: Internal Medicine

## 2019-08-25 ENCOUNTER — Other Ambulatory Visit: Payer: Self-pay

## 2019-08-25 ENCOUNTER — Encounter: Payer: Self-pay | Admitting: Internal Medicine

## 2019-08-25 VITALS — BP 128/70 | HR 70 | Temp 97.9°F | Ht 67.0 in | Wt 228.0 lb

## 2019-08-25 DIAGNOSIS — E7800 Pure hypercholesterolemia, unspecified: Secondary | ICD-10-CM

## 2019-08-25 MED ORDER — ALPRAZOLAM 0.25 MG PO TABS: 0.2500 mg | ORAL_TABLET | Freq: Two times a day (BID) | ORAL | 2 refills | Status: DC | PRN

## 2019-08-25 MED ORDER — ERGOCALCIFEROL 1.25 MG (50000 UT) PO CAPS: 50000.0000 [IU] | ORAL_CAPSULE | ORAL | 3 refills | Status: DC

## 2019-09-17 NOTE — Progress Notes (Signed)
   Subjective:    Patient ID: Tricia Miranda, female    DOB: March 09, 1966, 54 y.o.   MRN: [id number]  HPI 54 year old Female in for follow up.  3 months ago, total cholesterol was 251 and now it is 2009.  LDL cholesterol was 164 and is now 129.  Liver functions are normal.  Has been taking Crestor twice weekly.  Weight in January was 225 pounds and is now 228 pounds.  With regard to musculoskeletal pain, she was able to get relief with physical therapy from [facility].  Continues to work remotely.  History of vitamin D deficiency and currently is on high-dose vitamin D weekly.  History of hypothyroidism, history of anxiety treated with Xanax, history of osteoarthritis right knee.  Review of Systems see above-no new complaints     Objective:   Physical Exam Blood pressure 128/70 pulse 70 temperature 97.9 degrees pulse oximetry 97% weight 228 pounds BMI 35.71  No thyromegaly.  Chest clear to auscultation.  Cardiac exam regular rate and rhythm.  Labs reviewed with regard to hyperlipidemia and twice weekly Crestor.  There is considerable improvement.       Assessment & Plan:  Hyperlipidemia-considerable improvement on twice weekly Crestor  Hypothyroidism--stable on thyroid replacement  BMI 35.71-needs to work on diet exercise and weight loss.  Osteoarthritis right knee-has been treated with NSAID  History of anxiety-treated with Xanax  Plan: Health maintenance exam is due January 2022.  Continue current medications.  Work on diet and exercise.

## 2019-09-17 NOTE — Patient Instructions (Addendum)
Lipids show improvement on Crestor.  Continue this twice weekly.  Continue weekly vitamin D supplement.  Continue thyroid replacement medication.  Try to get more exercise.  May benefit from Dr. Migdalia Dk clinic.  Return January 21 for health maintenance exam.

## 2019-09-29 ENCOUNTER — Other Ambulatory Visit: Payer: Self-pay | Admitting: Internal Medicine

## 2019-10-04 ENCOUNTER — Ambulatory Visit (INDEPENDENT_AMBULATORY_CARE_PROVIDER_SITE_OTHER): Payer: No Typology Code available for payment source | Admitting: Orthopedic Surgery

## 2019-10-04 ENCOUNTER — Encounter: Payer: Self-pay | Admitting: Orthopedic Surgery

## 2019-10-04 ENCOUNTER — Ambulatory Visit: Payer: Self-pay

## 2019-10-04 ENCOUNTER — Other Ambulatory Visit: Payer: Self-pay

## 2019-10-04 DIAGNOSIS — M1712 Unilateral primary osteoarthritis, left knee: Secondary | ICD-10-CM | POA: Diagnosis not present

## 2019-10-04 DIAGNOSIS — M1711 Unilateral primary osteoarthritis, right knee: Secondary | ICD-10-CM | POA: Diagnosis not present

## 2019-10-04 DIAGNOSIS — M79644 Pain in right finger(s): Secondary | ICD-10-CM

## 2019-10-04 MED ORDER — HYLAN G-F 20 48 MG/6ML IX SOSY
48.0000 mg | PREFILLED_SYRINGE | INTRA_ARTICULAR | Status: AC | PRN
  Administered 2019-10-04: 48 mg via INTRA_ARTICULAR

## 2019-10-04 MED ORDER — BUPIVACAINE HCL 0.25 % IJ SOLN
4.0000 mL | INTRAMUSCULAR | Status: AC | PRN
  Administered 2019-10-04: 4 mL via INTRA_ARTICULAR

## 2019-10-04 MED ORDER — LIDOCAINE HCL 1 % IJ SOLN
5.0000 mL | INTRAMUSCULAR | Status: AC | PRN
  Administered 2019-10-04: 5 mL

## 2019-10-04 MED ORDER — METHYLPREDNISOLONE ACETATE 40 MG/ML IJ SUSP
40.0000 mg | INTRAMUSCULAR | Status: AC | PRN
  Administered 2019-10-04: 40 mg via INTRA_ARTICULAR

## 2019-10-04 NOTE — Progress Notes (Addendum)
Office Visit Note   Patient: Tricia Miranda           Date of Birth: May 17, 1965           MRN: [id number] Visit Date: 10/04/2019 Requested by: Elby Showers, MD [address] [place],  [place] [address] PCP: Elby Showers, MD  Subjective: Chief Complaint  Patient presents with   Left Knee - Pain   Right Hand - Pain   Right Knee - Pain    HPI: Tricia Miranda is a 54 year old patient with left knee pain and right thumb pain.  Injured the left knee about 2 weeks ago.  She has been preapproved for right knee gel injection due to arthritis.  The left knee however she stepped wrong when she was at the sink and felt a sharp pain.  She feels like something slides out of place in the knee.  Does report some weakness and popping.  Difficulty with stairs but no waking from sleep with pain.  Therapy has helped the right knee pain but the left knee is more symptomatic at this time.  She cannot walk the dog more than 20 minutes without pain.  She also describes right thumb pain around the [facility] joint.  Reports increased pain with gripping and grasping.  Denies any history of injury.  Has a picture from 2 weeks ago when the pain started of redness and inflammation around that [facility] joint.  It has improved some.  No personal or family history of gout or pseudogout.              ROS: All systems reviewed are negative as they relate to the chief complaint within the history of present illness.  Patient denies  fevers or chills.   Assessment & Plan: Visit Diagnoses:  1. Pain of right thumb     Plan: Impression is right knee pain with Synvisc injection performed today.  Patient also has left knee pain and effusion.  Radiographs pretty unremarkable except for some mild to moderate degenerative changes.  Collateral cruciate ligaments are stable.  Plan is aspiration and injection of the left knee today for diagnostic and therapeutic purposes.  Will be interesting to see how the right and left knees respond to  Synvisc injection and cortisone injection.  Each has about the same amount of radiographic arthritis.  Regarding the thumb patient does have atypical thumb pain but with positive grind test for [facility] problems.  No real arthritis on radiographs.  Hard to know exactly what is going on in the thumb but that something that is improving on its own.  We will see her back in about 6 weeks for clinical recheck and decision for or against further imaging on the thumb and left knee at that time.  The left knee could be meniscal pathology which may need to be addressed arthroscopically if she is not improved with the injection.  Follow-Up Instructions: No follow-ups on file.   Orders:  Orders Placed This Encounter  Procedures   XR Finger Thumb Right   No orders of the defined types were placed in this encounter.     Procedures: Large Joint Inj: R knee on 10/04/2019 10:14 PM Indications: pain, joint swelling and diagnostic evaluation Details: 18 G 1.5 in needle, superolateral approach  Arthrogram: No  Medications: 5 mL lidocaine 1 %; 48 mg Hylan 48 MG/6ML Outcome: tolerated well, no immediate complications Procedure, treatment alternatives, risks and benefits explained, specific risks discussed. Consent was given by the patient.  Immediately prior to procedure a time out was called to verify the correct patient, procedure, equipment, support staff and site/side marked as required. Patient was prepped and draped in the usual sterile fashion.   Large Joint Inj: L knee on 10/04/2019 10:14 PM Indications: diagnostic evaluation, joint swelling and pain Details: 18 G 1.5 in needle, superolateral approach  Arthrogram: No  Medications: 5 mL lidocaine 1 %; 40 mg methylPREDNISolone acetate 40 MG/ML; 4 mL bupivacaine 0.25 % Outcome: tolerated well, no immediate complications Procedure, treatment alternatives, risks and benefits explained, specific risks discussed. Consent was given by the patient. Immediately  prior to procedure a time out was called to verify the correct patient, procedure, equipment, support staff and site/side marked as required. Patient was prepped and draped in the usual sterile fashion.       Clinical Data: No additional findings.  Objective: Vital Signs: LMP 11/15/2018   Physical Exam:   Constitutional: Patient appears well-developed HEENT:  Head: Normocephalic Eyes:EOM are normal Neck: Normal range of motion Cardiovascular: Normal rate Pulmonary/chest: Effort normal Neurologic: Patient is alert Skin: Skin is warm Psychiatric: Patient has normal mood and affect    Ortho Exam: Ortho exam demonstrates normal gait alignment.  She has no effusion right knee mild to moderate effusion in the left knee.  Collateral and cruciate ligaments are stable.  She has some medial joint line tenderness bilaterally.  Pedal pulses palpable.  No masses lymphadenopathy or skin changes noted in either knee region.  No groin pain with internal extra rotation of either leg.  Knee flexion is easily to about 120 bilaterally.  Right thumb is examined.  CMC grind test positive on the right negative on the left EPL FPL interosseous strength is intact bilaterally.  Wrist flexion extension strength is intact.  No tenderness over the first dorsal compartment on the right-hand side.  Patient does have tenderness to palpation with any pinching on the right which is not present on the left.  Radial pulses intact.  Specialty Comments:  No specialty comments available.  Imaging: XR Finger Thumb Right  Result Date: 10/04/2019 AP lateral oblique right thumb reviewed.  CMC joint is located with no significant arthritic changes.  No fracture or dislocation is present.  Normal right thumb.    PMFS History: Patient Active Problem List   Diagnosis Date Noted   Postoperative state 11/15/2018   Post-operative state 11/15/2018   Snoring 11/23/2017   Diarrhea 06/23/2012   History of vitamin D  deficiency 11/14/2011   Essential tremor 11/14/2011   Hypothyroidism 10/09/2010   Anxiety states 10/09/2010   Migraine 10/09/2010   Past Medical History:  Diagnosis Date   Anxiety    Bronchitis    Cancer ([place])    basal cell- tiny   Fibroids    uterine    Hypothyroidism    Sleep apnea    uses oral device for mild sleep apnea    Thyroid disease    hypothyroidism    Family History  Problem Relation Age of Onset   Heart disease Father    Alcohol abuse Father    Stomach cancer Other    Colon cancer Neg Hx    Esophageal cancer Neg Hx    Pancreatic cancer Neg Hx    Prostate cancer Neg Hx    Rectal cancer Neg Hx     Past Surgical History:  Procedure Laterality Date   ABDOMINAL HYSTERECTOMY     COLONOSCOPY     with benign  polyp removal  DILITATION & CURRETTAGE/HYSTROSCOPY WITH VERSAPOINT RESECTION N/A 03/16/2014   Procedure: DILATATION & CURETTAGE/HYSTEROSCOPY WITH VERSAPOINT RESECTION;  Surgeon: [name], MD;  Location: [facility];  Service: Gynecology;  Laterality: N/A;  1 hr.   HYSTERECTOMY ABDOMINAL WITH SALPINGECTOMY Bilateral 11/15/2018   Procedure: HYSTERECTOMY ABDOMINAL WITH  BILATERAL SALPINGECTOMY;  Surgeon: [name], MD;  Location: [facility];  Service: Gynecology;  Laterality: Bilateral;   MOHS SURGERY Left a while- unknown   under left eyelid   WISDOM TOOTH EXTRACTION     Social History   Occupational History   Not on file  Tobacco Use   Smoking status: Never Smoker   Smokeless tobacco: Never Used  Vaping Use   Vaping Use: Never used  Substance and Sexual Activity   Alcohol use: Yes    Alcohol/week: 3.0 standard drinks    Types: 3 Standard drinks or equivalent per week    Comment: wine  with dinner occasionally or beer   Drug use: No   Sexual activity: Yes    Partners: Male    Comment: 1st intercourse- 28, partners- 64, married- 21 yrs

## 2019-11-21 ENCOUNTER — Other Ambulatory Visit: Payer: No Typology Code available for payment source | Admitting: Internal Medicine

## 2019-11-24 ENCOUNTER — Ambulatory Visit: Payer: No Typology Code available for payment source | Admitting: Internal Medicine

## 2019-11-28 ENCOUNTER — Other Ambulatory Visit: Payer: No Typology Code available for payment source | Admitting: Internal Medicine

## 2019-11-28 ENCOUNTER — Other Ambulatory Visit: Payer: Self-pay

## 2019-11-28 DIAGNOSIS — R739 Hyperglycemia, unspecified: Secondary | ICD-10-CM

## 2019-11-28 DIAGNOSIS — E7800 Pure hypercholesterolemia, unspecified: Secondary | ICD-10-CM

## 2019-11-30 ENCOUNTER — Other Ambulatory Visit: Payer: Self-pay

## 2019-11-30 ENCOUNTER — Ambulatory Visit: Payer: No Typology Code available for payment source | Admitting: Internal Medicine

## 2019-11-30 ENCOUNTER — Encounter: Payer: Self-pay | Admitting: Internal Medicine

## 2019-11-30 VITALS — BP 110/70 | HR 72 | Ht 67.0 in | Wt 222.0 lb

## 2019-11-30 DIAGNOSIS — E039 Hypothyroidism, unspecified: Secondary | ICD-10-CM

## 2019-11-30 DIAGNOSIS — Z789 Other specified health status: Secondary | ICD-10-CM | POA: Diagnosis not present

## 2019-11-30 DIAGNOSIS — M2550 Pain in unspecified joint: Secondary | ICD-10-CM | POA: Diagnosis not present

## 2019-11-30 DIAGNOSIS — Z6834 Body mass index (BMI) 34.0-34.9, adult: Secondary | ICD-10-CM

## 2019-11-30 DIAGNOSIS — E782 Mixed hyperlipidemia: Secondary | ICD-10-CM | POA: Diagnosis not present

## 2019-11-30 DIAGNOSIS — R7302 Impaired glucose tolerance (oral): Secondary | ICD-10-CM

## 2019-11-30 MED ORDER — MELOXICAM 15 MG PO TABS: 15.0000 mg | ORAL_TABLET | Freq: Every day | ORAL | 3 refills | Status: DC

## 2019-11-30 NOTE — Progress Notes (Signed)
   Subjective:    Patient ID: Tricia Miranda, female    DOB: [birth year], 54 y.o.   MRN: [id number]  HPI  54 year old Female for follow-up of pure hypercholesterolemia.  Patient was seen in May.  Currently taking Crestor twice weekly.  Feels that statin is causing joint pain.  Has not been taking it consistently.  Total cholesterol is 229, triglycerides 249 and LDL cholesterol 141.  History of hypothyroidism stable on thyroid replacement.  History of impaired glucose tolerance but hemoglobin A1c is 5.1% and stable.  She is on high-dose vitamin D therapy.  She has been having some issues with joint pain.  Would like to have some lab work done to see if there is a reason for that.  ANA and rheumatoid factor are negative.  Sed rate is 11.  CCP is less than 16.  Since she does not feel she can tolerate statin medication she will be referred to lipid clinic.        Review of Systems see above main complaint is arthralgias     Objective:   Physical Exam Blood pressure 110/70 pulse 72 pulse oximetry 95% weight 222 pounds BMI 34.77.  I do not see any inflamed or swollen joints.  No hot or tender joints.       Assessment & Plan:  Multiple joint arthralgias-stop statin medication.  Refer to lipid clinic.  Extensive work-up for rheumatology disorder is negative.  Hypothyroidism-stable on thyroid replacement medication  BMI 34.77-needs to work on diet and exercise  Plan: Will be referred to lipid clinic for alternative treatment for hyperlipidemia.  Has appointment in November with Dr. Debara Pickett is a new patient at lipid clinic.  Return here in January for health maintenance exam and fasting labs.  Some 30 minutes spent with patient as well as medical decision making and reviewing rheumatology work-up.

## 2019-12-01 LAB — LIPID PANEL
Cholesterol: 229 mg/dL — ABNORMAL HIGH (ref <200)
HDL: 50 mg/dL (ref >=50)
LDL Cholesterol (Calc): 141 mg/dL (calc) — ABNORMAL HIGH
Non-HDL Cholesterol (Calc): 179 mg/dL (calc) — ABNORMAL HIGH (ref <130)
Total CHOL/HDL Ratio: 4.6 (calc) (ref <5.0)
Triglycerides: 249 mg/dL — ABNORMAL HIGH (ref <150)

## 2019-12-01 LAB — HEPATIC FUNCTION PANEL
AG Ratio: 1.7 (calc) (ref 1.0–2.5)
ALT: 26 U/L (ref 6–29)
AST: 17 U/L (ref 10–35)
Albumin: 4.3 g/dL (ref 3.6–5.1)
Alkaline phosphatase (APISO): 98 U/L (ref 37–153)
Bilirubin, Direct: 0.1 mg/dL (ref 0.0–0.2)
Globulin: 2.5 g/dL (calc) (ref 1.9–3.7)
Indirect Bilirubin: 0.5 mg/dL (calc) (ref 0.2–1.2)
Total Bilirubin: 0.6 mg/dL (ref 0.2–1.2)
Total Protein: 6.8 g/dL (ref 6.1–8.1)

## 2019-12-01 LAB — HEMOGLOBIN A1C
Hgb A1c MFr Bld: 5.1 % of total Hgb (ref <5.7)
Mean Plasma Glucose: 100 (calc)
eAG (mmol/L): 5.5 (calc)

## 2019-12-01 LAB — TEST AUTHORIZATION

## 2019-12-01 LAB — GLUCOSE, RANDOM: Glucose, Plasma: 101 mg/dL (ref 65–139)

## 2019-12-04 ENCOUNTER — Other Ambulatory Visit: Payer: No Typology Code available for payment source | Admitting: Internal Medicine

## 2019-12-04 ENCOUNTER — Other Ambulatory Visit: Payer: Self-pay

## 2019-12-04 DIAGNOSIS — M2550 Pain in unspecified joint: Secondary | ICD-10-CM

## 2019-12-05 LAB — LIPID PANEL
Cholesterol: 225 mg/dL — ABNORMAL HIGH (ref <200)
HDL: 54 mg/dL (ref >=50)
LDL Cholesterol (Calc): 143 mg/dL (calc) — ABNORMAL HIGH
Non-HDL Cholesterol (Calc): 171 mg/dL (calc) — ABNORMAL HIGH (ref <130)
Total CHOL/HDL Ratio: 4.2 (calc) (ref <5.0)
Triglycerides: 150 mg/dL — ABNORMAL HIGH (ref <150)

## 2019-12-05 LAB — ANA: Anti Nuclear Antibody (ANA): NEGATIVE

## 2019-12-05 LAB — CYCLIC CITRUL PEPTIDE ANTIBODY, IGG: Cyclic Citrullin Peptide Ab: <16 UNITS

## 2019-12-05 LAB — SEDIMENTATION RATE: Sed Rate: 11 mm/h (ref 0–30)

## 2019-12-05 LAB — RHEUMATOID FACTOR: Rheumatoid fact SerPl-aCnc: <14 IU/mL (ref <14)

## 2019-12-05 LAB — CK, TOTAL(REFL): Total CK: 40 U/L (ref 29–143)

## 2019-12-14 ENCOUNTER — Encounter: Payer: No Typology Code available for payment source | Admitting: Obstetrics & Gynecology

## 2020-01-05 ENCOUNTER — Encounter: Payer: Self-pay | Admitting: Obstetrics & Gynecology

## 2020-01-05 ENCOUNTER — Other Ambulatory Visit: Payer: Self-pay

## 2020-01-05 ENCOUNTER — Ambulatory Visit (INDEPENDENT_AMBULATORY_CARE_PROVIDER_SITE_OTHER): Payer: No Typology Code available for payment source | Admitting: Obstetrics & Gynecology

## 2020-01-05 VITALS — BP 128/78 | Ht 66.5 in | Wt 233.0 lb

## 2020-01-05 DIAGNOSIS — Z90710 Acquired absence of both cervix and uterus: Secondary | ICD-10-CM | POA: Diagnosis not present

## 2020-01-05 DIAGNOSIS — Z01419 Encounter for gynecological examination (general) (routine) without abnormal findings: Secondary | ICD-10-CM

## 2020-01-05 DIAGNOSIS — Z6837 Body mass index (BMI) 37.0-37.9, adult: Secondary | ICD-10-CM

## 2020-01-05 DIAGNOSIS — E6609 Other obesity due to excess calories: Secondary | ICD-10-CM

## 2020-01-05 DIAGNOSIS — Z23 Encounter for immunization: Secondary | ICD-10-CM

## 2020-01-05 NOTE — Progress Notes (Signed)
Tricia Miranda June 15, 1965 [id number]   History:    54 y.o. G1P1L1 Married.  RP:  Established patient presenting for annual gyn exam and Postop TAH/Bilateral Salpingectomy 11/15/2018  HPI: TAH/Bilateral Salpingectomy 11/15/2018.  Good postop healing.  No abdominopelvic pain.  No vaginal bleeding.  No fever.  Urine/BMs normal.  Breasts normal.  BMI 34.77.  Health labs with Fam MD.  [name] 2018.  Past medical history,surgical history, family history and social history were all reviewed and documented in the EPIC chart.  Gynecologic History Patient's last menstrual period was 11/15/2018.  Obstetric History OB History  Gravida Para Term Preterm AB Living  1 1       1   SAB TAB Ectopic Multiple Live Births               # Outcome Date GA Lbr Len/2nd Weight Sex Delivery Anes PTL Lv  1 Para              ROS: A ROS was performed and pertinent positives and negatives are included in the history.  GENERAL: No fevers or chills. HEENT: No change in vision, no earache, sore throat or sinus congestion. NECK: No pain or stiffness. CARDIOVASCULAR: No chest pain or pressure. No palpitations. PULMONARY: No shortness of breath, cough or wheeze. GASTROINTESTINAL: No abdominal pain, nausea, vomiting or diarrhea, melena or bright red blood per rectum. GENITOURINARY: No urinary frequency, urgency, hesitancy or dysuria. MUSCULOSKELETAL: No joint or muscle pain, no back pain, no recent trauma. DERMATOLOGIC: No rash, no itching, no lesions. ENDOCRINE: No polyuria, polydipsia, no heat or cold intolerance. No recent change in weight. HEMATOLOGICAL: No anemia or easy bruising or bleeding. NEUROLOGIC: No headache, seizures, numbness, tingling or weakness. PSYCHIATRIC: No depression, no loss of interest in normal activity or change in sleep pattern.     Exam:   BP 128/78   Ht 5' 6.5" (1.689 m)   Wt 233 lb (105.7 kg)   LMP 11/15/2018   BMI 37.04 kg/m   Body mass index is 37.04 kg/m.  General appearance :  Well developed well nourished female. No acute distress HEENT: Eyes: no retinal hemorrhage or exudates,  Neck supple, trachea midline, no carotid bruits, no thyroidmegaly Lungs: Clear to auscultation, no rhonchi or wheezes, or rib retractions  Heart: Regular rate and rhythm, no murmurs or gallops Breast:Examined in sitting and supine position were symmetrical in appearance, no palpable masses or tenderness,  no skin retraction, no nipple inversion, no nipple discharge, no skin discoloration, no axillary or supraclavicular lymphadenopathy Abdomen: no palpable masses or tenderness, no rebound or guarding Extremities: no edema or skin discoloration or tenderness  Pelvic: Vulva: Normal             Vagina: No gross lesions or discharge  Cervix/Uterus absent  Adnexa  Without masses or tenderness  Anus: Normal   Assessment/Plan:  54 y.o. female for annual exam   1. Well female exam with routine gynecological exam Gynecologic exam status post TAH/bilateral salpingectomy.  Pathology benign on cervix July 2020.  No indication to repeat a Pap test this year.  Breast exam normal.  Screening mammogram January 2021 was negative.  Colonoscopy 2018.  Health labs with family physician.  2. S/P TAH (total abdominal hysterectomy)  3. Class 2 obesity due to excess calories without serious comorbidity with body mass index (BMI) of 37.0 to 37.9 in adult Recommend a low calorie/carb diet.  Aerobic activities 5 times a week and light weightlifting every 2 days.  4.  Flu vaccine need Flu vaccine received.  Other orders - Flu Vaccine QUAD 36+ mos IM (Fluarix, Quad PF)  [name] MD, 4:28 PM 01/05/2020

## 2020-01-07 NOTE — Patient Instructions (Signed)
Stop statin medication.  Referral to lipid clinic for evaluation and treatment.  Extensive rheumatology work-up is negative for rheumatology disorder as a cause for arthralgias.  BMI is 34.77.  Continue to work on diet and exercise.  Continue thyroid replacement medication.  Return here in January 2022

## 2020-01-08 ENCOUNTER — Encounter: Payer: Self-pay | Admitting: Obstetrics & Gynecology

## 2020-01-11 ENCOUNTER — Ambulatory Visit (INDEPENDENT_AMBULATORY_CARE_PROVIDER_SITE_OTHER): Payer: No Typology Code available for payment source | Admitting: Nurse Practitioner

## 2020-01-11 ENCOUNTER — Encounter: Payer: Self-pay | Admitting: Nurse Practitioner

## 2020-01-11 ENCOUNTER — Other Ambulatory Visit: Payer: Self-pay

## 2020-01-11 ENCOUNTER — Telehealth: Payer: Self-pay | Admitting: *Deleted

## 2020-01-11 ENCOUNTER — Telehealth: Payer: Self-pay | Admitting: Internal Medicine

## 2020-01-11 VITALS — BP 130/78

## 2020-01-11 DIAGNOSIS — N644 Mastodynia: Secondary | ICD-10-CM

## 2020-01-11 DIAGNOSIS — R1032 Left lower quadrant pain: Secondary | ICD-10-CM

## 2020-01-11 NOTE — Progress Notes (Signed)
   Acute Office Visit  Subjective:    Patient ID: Tricia Miranda, female    DOB: Apr 30, 1965, 54 y.o.   MRN: [id number]   HPI 54 y.o. presents today for left lower abdominal pain that started 5 days ago.  Describes the pain as constant, mild and dull with intermittent episodes of moderate pain up to a 4 out of 10.  Normal frequency of bowel movements but says they have been more loose and watery.  Denies nausea and vomiting.  Does notice tenderness when lying on her left side.  Denies urinary symptoms.  TAH July 2020 with bilateral salpingectomy.  Review of Systems  Constitutional: Negative.   Gastrointestinal: Positive for abdominal pain (LLQ) and diarrhea. Negative for blood in stool, constipation, nausea and vomiting.  Genitourinary: Negative.        Objective:    Physical Exam Constitutional:      Appearance: Normal appearance. She is obese.  Abdominal:     General: Bowel sounds are normal.     Tenderness: There is abdominal tenderness in the left lower quadrant. There is no guarding or rebound.  Genitourinary:    General: Normal vulva.     Vagina: Normal.     BP 130/78 (BP Location: Right Arm, Patient Position: Sitting, Cuff Size: Large)   LMP 11/15/2018  Wt Readings from Last 3 Encounters:  01/05/20 233 lb (105.7 kg)  11/30/19 222 lb (100.7 kg)  08/25/19 228 lb (103.4 kg)        Assessment & Plan:   Problem List Items Addressed This Visit    None    Visit Diagnoses    Left lower quadrant abdominal pain    -  Primary      Plan: Symptoms most likely GI related.  Based on left lower quadrant abdominal pain, changes in bowel consistency, and recent diet consulting of sunflower seeds and quinoa I am suspicious for diverticulosis/diverticulitis.  Recommended increasing fiber and avoiding foods that precipitate this such as nuts, corn, popcorn, and seeds.  Instructed to go to the emergency room if symptoms become severe over the weekend.  She is agreeable to  plan.    [place], 9:12 AM 01/11/2020

## 2020-01-11 NOTE — Telephone Encounter (Signed)
Patient called states she never heard from the office regarding breast imaging at the breast center for left breast pain. I didn't see the anything in chart about this, she did have normal mammogram in Jan. Okay to place orders at breast center?

## 2020-01-11 NOTE — Telephone Encounter (Signed)
Yes Left Dx mammo/US.

## 2020-01-11 NOTE — Telephone Encounter (Signed)
Patient saw GYN nurse practitioner this am. Has had some LLQ abdominal pain and diarrhea for a few days. Patient thinks this may be getting better. Nurse practitioner raised question of diverticulitis. I have asked patient to palpate LLQ. She think she is a bit better. Asked about rebound. Doesn't think she has that with palpation of LLQ. Advised clear liquids and advance diet slowly. Call tomorrow if not improving.

## 2020-01-11 NOTE — Telephone Encounter (Signed)
Orders placed at breast center they will call to schedule.

## 2020-01-12 ENCOUNTER — Telehealth: Payer: Self-pay | Admitting: Internal Medicine

## 2020-01-12 NOTE — Telephone Encounter (Signed)
Tricia Miranda [phone]  [name] called to pain is less and some better, she still has a twinge every now and then, however she has not had a bowel movement for 48 hours. This is unusual for her. She had diarrhea from Sunday to Wednesday morning.

## 2020-01-12 NOTE — Telephone Encounter (Signed)
Called patient to let her know what Dr Renold Genta said, she stated she would give a few more days if it was not a big concern. Will call back if she does not get better.

## 2020-01-12 NOTE — Telephone Encounter (Signed)
She could try a Dulcolax suppository from the drug store over the counter. However if not eating much may not have much stool.

## 2020-01-15 NOTE — Telephone Encounter (Signed)
Patient scheduled at the breast center on 01/30/20 @ 8:30am. Left message for patient to call.

## 2020-01-17 NOTE — Telephone Encounter (Signed)
Left detailed message on cell per DPR access. 

## 2020-01-30 ENCOUNTER — Ambulatory Visit
Admission: RE | Admit: 2020-01-30 | Discharge: 2020-01-30 | Disposition: A | Discharge status: Home / Self Care | Payer: No Typology Code available for payment source | Source: Ambulatory Visit | Attending: Obstetrics & Gynecology | Admitting: Obstetrics & Gynecology

## 2020-01-30 ENCOUNTER — Other Ambulatory Visit: Payer: Self-pay

## 2020-01-30 DIAGNOSIS — N644 Mastodynia: Secondary | ICD-10-CM

## 2020-02-29 ENCOUNTER — Other Ambulatory Visit: Payer: Self-pay

## 2020-02-29 ENCOUNTER — Encounter: Payer: Self-pay | Admitting: Internal Medicine

## 2020-02-29 ENCOUNTER — Ambulatory Visit (INDEPENDENT_AMBULATORY_CARE_PROVIDER_SITE_OTHER): Payer: No Typology Code available for payment source | Admitting: Internal Medicine

## 2020-02-29 VITALS — BP 124/80 | HR 80 | Ht 67.0 in | Wt 236.4 lb

## 2020-02-29 DIAGNOSIS — M791 Myalgia, unspecified site: Secondary | ICD-10-CM | POA: Diagnosis not present

## 2020-02-29 DIAGNOSIS — T466X5A Adverse effect of antihyperlipidemic and antiarteriosclerotic drugs, initial encounter: Secondary | ICD-10-CM

## 2020-02-29 DIAGNOSIS — E785 Hyperlipidemia, unspecified: Secondary | ICD-10-CM | POA: Diagnosis not present

## 2020-02-29 DIAGNOSIS — E668 Other obesity: Secondary | ICD-10-CM | POA: Diagnosis not present

## 2020-02-29 NOTE — Patient Instructions (Signed)
Medication Instructions:  Your physician recommends that you continue on your current medications as directed. Please refer to the Current Medication list given to you today.  *If you need a refill on your cardiac medications before your next appointment, please call your pharmacy*   Lab Work: FASTING LIPID PANEL in 3-4 months ** complete about 1 week before your next visit with Dr. Debara Pickett   If you have labs (blood work) drawn today and your tests are completely normal, you will receive your results only by:  [place] (if you have MyChart) OR  A paper copy in the mail If you have any lab test that is abnormal or we need to change your treatment, we will call you to review the results.   Testing/Procedures: Dr. Debara Pickett has ordered a CT coronary calcium score. This test is done at [address]. This is $150 out of pocket.   Coronary CalciumScan A coronary calcium scan is an imaging test used to look for deposits of calcium and other fatty materials (plaques) in the inner lining of the blood vessels of the heart (coronary arteries). These deposits of calcium and plaques can partly clog and narrow the coronary arteries without producing any symptoms or warning signs. This puts a person at risk for a heart attack. This test can detect these deposits before symptoms develop. Tell a health care provider about:  Any allergies you have.  All medicines you are taking, including vitamins, herbs, eye drops, creams, and over-the-counter medicines.  Any problems you or family members have had with anesthetic medicines.  Any blood disorders you have.  Any surgeries you have had.  Any medical conditions you have.  Whether you are pregnant or may be pregnant. What are the risks? Generally, this is a safe procedure. However, problems may occur, including:  Harm to a pregnant woman and her unborn baby. This test involves the use of radiation. Radiation exposure can be  dangerous to a pregnant woman and her unborn baby. If you are pregnant, you generally should not have this procedure done.  Slight increase in the risk of cancer. This is because of the radiation involved in the test. What happens before the procedure? No preparation is needed for this procedure. What happens during the procedure?  You will undress and remove any jewelry around your neck or chest.  You will put on a hospital gown.  Sticky electrodes will be placed on your chest. The electrodes will be connected to an electrocardiogram (ECG) machine to record a tracing of the electrical activity of your heart.  A CT scanner will take pictures of your heart. During this time, you will be asked to lie still and hold your breath for 2-3 seconds while a picture of your heart is being taken. The procedure may vary among health care providers and hospitals. What happens after the procedure?  You can get dressed.  You can return to your normal activities.  It is up to you to get the results of your test. Ask your health care provider, or the department that is doing the test, when your results will be ready. Summary  A coronary calcium scan is an imaging test used to look for deposits of calcium and other fatty materials (plaques) in the inner lining of the blood vessels of the heart (coronary arteries).  Generally, this is a safe procedure. Tell your health care provider if you are pregnant or may be pregnant.  No preparation is needed for  this procedure.  A CT scanner will take pictures of your heart.  You can return to your normal activities after the scan is done. This information is not intended to replace advice given to you by your health care provider. Make sure you discuss any questions you have with your health care provider. Document Released: 10/03/2007 Document Revised: 02/24/2016 Document Reviewed: 02/24/2016 Elsevier Interactive Patient Education  2017 [place]: At [facility], you and your health needs are our priority.  As part of our continuing mission to provide you with exceptional heart care, we have created designated Provider Care Teams.  These Care Teams include your primary Cardiologist (physician) and Advanced Practice Providers (APPs -  Physician Assistants and Nurse Practitioners) who all work together to provide you with the care you need, when you need it.  We recommend signing up for the patient portal called "MyChart".  Sign up information is provided on this After Visit Summary.  MyChart is used to connect with patients for Virtual Visits (Telemedicine).  Patients are able to view lab/test results, encounter notes, upcoming appointments, etc.  Non-urgent messages can be sent to your provider as well.   To learn more about what you can do with MyChart, go to [email or web address].    Your next appointment:   3-4 month(s) - lipid panel  The format for your next appointment:   In Person  Provider:   K. Mali Hilty, MD   Other Instructions

## 2020-02-29 NOTE — Progress Notes (Signed)
LIPID CLINIC CONSULT NOTE  Chief Complaint:  Manage dyslipidemia  Primary Care Physician: Elby Showers, MD  Primary Cardiologist:  No primary care provider on file.  HPI:  Tricia Miranda is a 54 y.o. female who is being seen today for the evaluation of dyslipidemia at the request of Baxley, Cresenciano Lick, MD. This is a pleasant 54 year old female kindly referred for evaluation management of dyslipidemia.  Her PCP had referred her due to progressively worsening dyslipidemia for management recommendations.  According to [name] she has had longstanding issues with high cholesterol and had previously at one point been on simvastatin which she said caused significant joint aches and feeling of overall muscle soreness.  Subsequently in the past had been changed over to rosuvastatin 5 mg daily which she said she took for a number of years without any significant side effects but then started to develop more significant muscle and joint aches including feelings of muscle pulsations.  Her rosuvastatin was then decreased to a point where she was only on it twice weekly.  Her LDL a year ago was 115 but has since risen and currently is 141, total cholesterol 229, HDL 50 and triglycerides 249.  This data was 3 months ago and she reports that she has been off of her statin medication since that time so I expect that her cholesterol may be higher.  Fortunately, it appears that she has few other cardiovascular risk factors.  She does not have any diabetes, hypertension, known coronary disease or prior to cardiovascular events.  There is heart disease in her father who had two-vessel bypass but was a smoker and her mother reportedly had very high cholesterol she says in the 400s and was on medication for a while but then was told to come off of it after heart catheterization showed no significant coronary disease but subsequently then was restarted on it.  She reports a variable but generally low saturated fat  diet with fish, chicken and vegetables and other healthy sources.  PMHx:  Past Medical History:  Diagnosis Date  . Anxiety   . Bronchitis   . Cancer ([place])    basal cell- tiny  . Fibroids    uterine   . Hypothyroidism   . Sleep apnea    uses oral device for mild sleep apnea   . Thyroid disease    hypothyroidism    Past Surgical History:  Procedure Laterality Date  . ABDOMINAL HYSTERECTOMY    . COLONOSCOPY     with benign  polyp removal   . DILITATION & CURRETTAGE/HYSTROSCOPY WITH VERSAPOINT RESECTION N/A 03/16/2014   Procedure: DILATATION & CURETTAGE/HYSTEROSCOPY WITH VERSAPOINT RESECTION;  Surgeon: [name], MD;  Location: [facility];  Service: Gynecology;  Laterality: N/A;  1 hr.  . HYSTERECTOMY ABDOMINAL WITH SALPINGECTOMY Bilateral 11/15/2018   Procedure: HYSTERECTOMY ABDOMINAL WITH  BILATERAL SALPINGECTOMY;  Surgeon: [name], MD;  Location: [facility];  Service: Gynecology;  Laterality: Bilateral;  . MOHS SURGERY Left a while- unknown   under left eyelid  . WISDOM TOOTH EXTRACTION      FAMHx:  Family History  Problem Relation Age of Onset  . Heart disease Father   . Alcohol abuse Father   . Stomach cancer Other   . Colon cancer Neg Hx   . Esophageal cancer Neg Hx   . Pancreatic cancer Neg Hx   . Prostate cancer Neg Hx   . Rectal cancer Neg Hx     SOCHx:  reports that she has never smoked. She has never used smokeless tobacco. She reports current alcohol use of about 3.0 standard drinks of alcohol per week. She reports that she does not use drugs.  ALLERGIES:  No Known Allergies  ROS: Pertinent items noted in HPI and remainder of comprehensive ROS otherwise negative.  HOME MEDS: Current Outpatient Medications on File Prior to Visit  Medication Sig Dispense Refill  . albuterol (VENTOLIN HFA) 108 (90 Base) MCG/ACT inhaler Inhale 2 puffs into the lungs every 6 (six) hours as needed for wheezing or shortness of breath. 8 g 11  .  ALPRAZolam (XANAX) 0.25 MG tablet Take 1 tablet (0.25 mg total) by mouth 2 (two) times daily as needed. 60 tablet 2  . cetirizine (ZYRTEC) 10 MG tablet Take 10 mg by mouth daily as needed for allergies.    . cyanocobalamin 1000 MCG tablet Take 1,000 mcg by mouth daily.    . ergocalciferol (DRISDOL) 1.25 MG (50000 UT) capsule Take 1 capsule (50,000 Units total) by mouth once a week. 12 capsule 3  . FLUoxetine (PROZAC) 10 MG capsule TAKE ONE CAPSULE BY MOUTH DAILY (Patient taking differently: Take 10 mg by mouth daily. ) 90 capsule 2  . meloxicam (MOBIC) 15 MG tablet Take 1 tablet (15 mg total) by mouth daily. (Patient taking differently: Take 15 mg by mouth as needed. ) 30 tablet 3  . SYNTHROID 112 MCG tablet TAKE ONE TABLET BY MOUTH EVERY MORNING BEFORE BREAKFAST 90 tablet 2  . rosuvastatin (CRESTOR) 5 MG tablet One po twice a week with supper (Patient not taking: Reported on 02/29/2020) 90 tablet 3   No current facility-administered medications on file prior to visit.    LABS/IMAGING: No results found for this or any previous visit (from the past 48 hour(s)). No results found.  LIPID PANEL:    Component Value Date/Time   CHOL 225 (H) 12/04/2019 1109   TRIG 150 (H) 12/04/2019 1109   HDL 54 12/04/2019 1109   CHOLHDL 4.2 12/04/2019 1109   VLDL 26 11/08/2015 1026   LDLCALC 143 (H) 12/04/2019 1109    WEIGHTS: Wt Readings from Last 3 Encounters:  02/29/20 236 lb 6.4 oz (107.2 kg)  01/05/20 233 lb (105.7 kg)  11/30/19 222 lb (100.7 kg)    VITALS: BP 124/80   Pulse 80   Ht 5\' 7"  (1.702 m)   Wt 236 lb 6.4 oz (107.2 kg)   LMP 11/15/2018   SpO2 98%   BMI 37.03 kg/m   EXAM: General appearance: alert, no distress and moderately obese Neck: no carotid bruit, no JVD and thyroid not enlarged, symmetric, no tenderness/mass/nodules Lungs: clear to auscultation bilaterally Heart: regular rate and rhythm, S1, S2 normal, no murmur, click, rub or gallop Abdomen: soft, non-tender; bowel  sounds normal; no masses,  no organomegaly Extremities: extremities normal, atraumatic, no cyanosis or edema Pulses: 2+ and symmetric Skin: Skin color, texture, turgor normal. No rashes or lesions Neurologic: Grossly normal Psych: Pleasant  EKG: Deferred  ASSESSMENT: 1. Mixed dyslipidemia 2. Statin intolerance - myalgias 3. Family history of high cholesterol 4. Moderate obesity  PLAN: 1.   [name] has a mixed dyslipidemia but has been statin intolerant, particularly simvastatin but also rosuvastatin even on lower doses.  I advised her to continue to stay off of either 1 of those.  Ultimately we may need to consider other statin therapy or alternatives.  Based on traditional cardiovascular risk factors her 10-year cardiovascular risk is only 2% which is low  however there is a family history of heart disease in her father and high cholesterol in her mother.  I do think a lot is dietary and may be related to continued weight gain which she says is crept up from 190 to 236 pounds since she has been in [place].  She will continue to work on diet and weight loss for control, but in the meantime I recommended coronary calcium score to better risk stratify her.  This may indicate whether we have more time to work on diet lifestyle modifications or need to consider earlier treatment.  Thanks again for the kind referral.  Plan follow-up with me in about 3 to 4 months with repeat lipids.  Pixie Casino, MD, [facility], [place] Director of the Advanced Lipid Disorders &  Cardiovascular Risk Reduction Clinic Diplomate of the American Board of Clinical Lipidology Attending Cardiologist  Direct Dial: [phone]  Fax: [phone]  Website:  [email or web address] Adelei Scobey 02/29/2020, 12:24 PM

## 2020-03-06 ENCOUNTER — Other Ambulatory Visit: Payer: Self-pay

## 2020-03-06 ENCOUNTER — Ambulatory Visit (INDEPENDENT_AMBULATORY_CARE_PROVIDER_SITE_OTHER)
Admission: RE | Admit: 2020-03-06 | Discharge: 2020-03-06 | Disposition: A | Discharge status: Home / Self Care | Payer: Self-pay | Source: Ambulatory Visit | Attending: Internal Medicine | Admitting: Internal Medicine

## 2020-03-06 DIAGNOSIS — E785 Hyperlipidemia, unspecified: Secondary | ICD-10-CM

## 2020-03-25 ENCOUNTER — Other Ambulatory Visit: Payer: Self-pay | Admitting: Internal Medicine

## 2020-04-09 ENCOUNTER — Other Ambulatory Visit: Payer: No Typology Code available for payment source

## 2020-04-09 DIAGNOSIS — Z20822 Contact with and (suspected) exposure to covid-19: Secondary | ICD-10-CM

## 2020-04-11 LAB — NOVEL CORONAVIRUS, NAA: SARS-CoV-2, NAA: NOT DETECTED

## 2020-04-11 LAB — SARS-COV-2, NAA 2 DAY TAT

## 2020-04-26 ENCOUNTER — Other Ambulatory Visit: Payer: Self-pay

## 2020-04-26 ENCOUNTER — Telehealth (INDEPENDENT_AMBULATORY_CARE_PROVIDER_SITE_OTHER): Payer: No Typology Code available for payment source | Admitting: Internal Medicine

## 2020-04-26 ENCOUNTER — Telehealth: Payer: Self-pay | Admitting: Internal Medicine

## 2020-04-26 DIAGNOSIS — U071 COVID-19: Secondary | ICD-10-CM

## 2020-04-26 NOTE — Telephone Encounter (Signed)
Virtual visit 

## 2020-04-26 NOTE — Telephone Encounter (Signed)
scheduled

## 2020-04-26 NOTE — Telephone Encounter (Signed)
Tricia Miranda [phone]  [name] called to say she did a home COVID test last night and it was positive, she started having gastro intestinal symptoms on Tuesday and sinus symptoms yesterday like stuffy nose, post nasal drip, sore throat, headache, body aches comes and goes, no fever, no cough. She had COVID Booster in November. Her son had tested positive for COVID on 04/09/2020 and he got negative results on 04/18/2020. She is wandering hat she should do.

## 2020-04-29 ENCOUNTER — Other Ambulatory Visit: Payer: No Typology Code available for payment source

## 2020-04-29 DIAGNOSIS — Z20822 Contact with and (suspected) exposure to covid-19: Secondary | ICD-10-CM

## 2020-04-30 ENCOUNTER — Other Ambulatory Visit: Payer: No Typology Code available for payment source

## 2020-04-30 LAB — NOVEL CORONAVIRUS, NAA: SARS-CoV-2, NAA: DETECTED — AB

## 2020-04-30 LAB — SARS-COV-2, NAA 2 DAY TAT

## 2020-05-07 ENCOUNTER — Other Ambulatory Visit: Payer: No Typology Code available for payment source | Admitting: Internal Medicine

## 2020-05-10 ENCOUNTER — Encounter: Payer: No Typology Code available for payment source | Admitting: Internal Medicine

## 2020-05-11 ENCOUNTER — Encounter: Payer: Self-pay | Admitting: Internal Medicine

## 2020-05-11 NOTE — Progress Notes (Signed)
   Subjective:    Patient ID: Tricia Miranda, female    DOB: 03-08-1966, 55 y.o.   MRN: [id number]  HPI 56 year old Female with history of hypothyroidism, hyperlipidemia, vitamin D deficiency, anxiety and allergic rhinitis seen today via interactive audio and video telecommunications due to coronavirus pandemic.  She is identified using 2 identifiers as Tricia L.  Miranda, a patient in this practice.  She is agreeable to visit in this format today.  Patient is at home and I am in my office.  Patient did a home COVID test last evening and it was positive.  Patient stated she had onset of gastrointestinal symptoms initially and subsequently developed stuffy nose, postnasal drip, sore throat headache myalgias but no fever or cough.  She had COVID-19 booster in November.  Her son tested positive for COVID on December 21 but recently got negative results on December 30.  Patient received 2 doses of Moderna vaccine in March and April 2021.  Had flu vaccine in September.  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  Aside from her son, it is not clear where she would have come in contact with COVID-19 as she is fairly careful about exposures.    Review of Systems she has no nausea or vomiting.     Objective:   Physical Exam  Patient seen virtually.  She appears to have no tachypnea.  Not heard to be coughing.  Currently afebrile      Assessment & Plan:  COVID-19 virus infection-positive results ongoing test  Plan: Patient needs to quarantine for minimum of 5 days.  Rest and drink plenty of fluids.  May take Tylenol for fever.  Call if symptoms.  Monitor pulse oximetry at home.

## 2020-05-11 NOTE — Patient Instructions (Addendum)
Quarantine at home for minimum of 5 days.  May take Tylenol for fever.  Rest and drink plenty of fluids.  Call if symptoms worsen.  Monitor pulse oximetry at home.

## 2020-05-27 ENCOUNTER — Other Ambulatory Visit: Payer: No Typology Code available for payment source | Admitting: Internal Medicine

## 2020-05-27 DIAGNOSIS — Z0000 Encounter for general adult medical examination without abnormal findings: Secondary | ICD-10-CM

## 2020-05-27 DIAGNOSIS — E538 Deficiency of other specified B group vitamins: Secondary | ICD-10-CM

## 2020-05-27 DIAGNOSIS — M2550 Pain in unspecified joint: Secondary | ICD-10-CM

## 2020-05-27 DIAGNOSIS — Z6834 Body mass index (BMI) 34.0-34.9, adult: Secondary | ICD-10-CM

## 2020-05-27 DIAGNOSIS — E782 Mixed hyperlipidemia: Secondary | ICD-10-CM

## 2020-05-27 DIAGNOSIS — G8929 Other chronic pain: Secondary | ICD-10-CM

## 2020-05-27 DIAGNOSIS — G250 Essential tremor: Secondary | ICD-10-CM

## 2020-05-27 DIAGNOSIS — R7302 Impaired glucose tolerance (oral): Secondary | ICD-10-CM

## 2020-05-27 DIAGNOSIS — E559 Vitamin D deficiency, unspecified: Secondary | ICD-10-CM

## 2020-05-27 DIAGNOSIS — Z789 Other specified health status: Secondary | ICD-10-CM

## 2020-05-27 DIAGNOSIS — E039 Hypothyroidism, unspecified: Secondary | ICD-10-CM

## 2020-05-27 DIAGNOSIS — E7800 Pure hypercholesterolemia, unspecified: Secondary | ICD-10-CM

## 2020-05-28 ENCOUNTER — Encounter: Payer: Self-pay | Admitting: Internal Medicine

## 2020-05-28 ENCOUNTER — Other Ambulatory Visit: Payer: Self-pay

## 2020-05-28 ENCOUNTER — Other Ambulatory Visit: Payer: No Typology Code available for payment source | Admitting: Internal Medicine

## 2020-05-29 LAB — CBC WITH DIFFERENTIAL/PLATELET
Absolute Monocytes: 335 cells/uL (ref 200–950)
Basophils Absolute: 54 cells/uL (ref 0–200)
Basophils Relative: 0.8 %
Eosinophils Absolute: 141 cells/uL (ref 15–500)
Eosinophils Relative: 2.1 %
HCT: 41.5 % (ref 35.0–45.0)
Hemoglobin: 14.1 g/dL (ref 11.7–15.5)
Lymphs Abs: 2425 cells/uL (ref 850–3900)
MCH: 28.2 pg (ref 27.0–33.0)
MCHC: 34 g/dL (ref 32.0–36.0)
MCV: 83 fL (ref 80.0–100.0)
MPV: 9.9 fL (ref 7.5–12.5)
Monocytes Relative: 5 %
Neutro Abs: 3745 cells/uL (ref 1500–7800)
Neutrophils Relative %: 55.9 %
Platelets: 306 10*3/uL (ref 140–400)
RBC: 5 10*6/uL (ref 3.80–5.10)
RDW: 12.4 % (ref 11.0–15.0)
Total Lymphocyte: 36.2 %
WBC: 6.7 10*3/uL (ref 3.8–10.8)

## 2020-05-29 LAB — LIPID PANEL
Cholesterol: 239 mg/dL — ABNORMAL HIGH (ref <200)
HDL: 56 mg/dL (ref >=50)
LDL Cholesterol (Calc): 155 mg/dL (calc) — ABNORMAL HIGH
Non-HDL Cholesterol (Calc): 183 mg/dL (calc) — ABNORMAL HIGH (ref <130)
Total CHOL/HDL Ratio: 4.3 (calc) (ref <5.0)
Triglycerides: 151 mg/dL — ABNORMAL HIGH (ref <150)

## 2020-05-29 LAB — COMPLETE METABOLIC PANEL WITH GFR
AG Ratio: 1.7 (calc) (ref 1.0–2.5)
ALT: 23 U/L (ref 6–29)
AST: 15 U/L (ref 10–35)
Albumin: 4.3 g/dL (ref 3.6–5.1)
Alkaline phosphatase (APISO): 104 U/L (ref 37–153)
BUN: 11 mg/dL (ref 7–25)
CO2: 29 mmol/L (ref 20–32)
Calcium: 9.5 mg/dL (ref 8.6–10.4)
Chloride: 102 mmol/L (ref 98–110)
Creat: 0.52 mg/dL (ref 0.50–1.05)
GFR, Est African American: 126 mL/min/{1.73_m2} (ref >=60)
GFR, Est Non African American: 108 mL/min/{1.73_m2} (ref >=60)
Globulin: 2.6 g/dL (calc) (ref 1.9–3.7)
Glucose, Bld: 103 mg/dL — ABNORMAL HIGH (ref 65–99)
Potassium: 4.4 mmol/L (ref 3.5–5.3)
Sodium: 139 mmol/L (ref 135–146)
Total Bilirubin: 0.5 mg/dL (ref 0.2–1.2)
Total Protein: 6.9 g/dL (ref 6.1–8.1)

## 2020-05-29 LAB — HEMOGLOBIN A1C
Hgb A1c MFr Bld: 5.2 % of total Hgb (ref <5.7)
Mean Plasma Glucose: 103 mg/dL
eAG (mmol/L): 5.7 mmol/L

## 2020-05-29 LAB — VITAMIN D 25 HYDROXY (VIT D DEFICIENCY, FRACTURES): Vit D, 25-Hydroxy: 43 ng/mL (ref 30–100)

## 2020-05-29 LAB — TSH: TSH: 1.25 mIU/L

## 2020-05-31 ENCOUNTER — Encounter: Payer: Self-pay | Admitting: Internal Medicine

## 2020-05-31 ENCOUNTER — Ambulatory Visit (INDEPENDENT_AMBULATORY_CARE_PROVIDER_SITE_OTHER): Payer: No Typology Code available for payment source | Admitting: Internal Medicine

## 2020-05-31 ENCOUNTER — Other Ambulatory Visit: Payer: Self-pay

## 2020-05-31 VITALS — BP 120/80 | HR 74 | Ht 67.0 in | Wt 229.0 lb

## 2020-05-31 DIAGNOSIS — Z789 Other specified health status: Secondary | ICD-10-CM

## 2020-05-31 DIAGNOSIS — E559 Vitamin D deficiency, unspecified: Secondary | ICD-10-CM

## 2020-05-31 DIAGNOSIS — Z0000 Encounter for general adult medical examination without abnormal findings: Secondary | ICD-10-CM | POA: Diagnosis not present

## 2020-05-31 DIAGNOSIS — R5381 Other malaise: Secondary | ICD-10-CM | POA: Diagnosis not present

## 2020-05-31 DIAGNOSIS — Z8669 Personal history of other diseases of the nervous system and sense organs: Secondary | ICD-10-CM

## 2020-05-31 DIAGNOSIS — Z8616 Personal history of COVID-19: Secondary | ICD-10-CM

## 2020-05-31 DIAGNOSIS — K589 Irritable bowel syndrome without diarrhea: Secondary | ICD-10-CM | POA: Insufficient documentation

## 2020-05-31 DIAGNOSIS — R7302 Impaired glucose tolerance (oral): Secondary | ICD-10-CM | POA: Diagnosis not present

## 2020-05-31 DIAGNOSIS — F439 Reaction to severe stress, unspecified: Secondary | ICD-10-CM

## 2020-05-31 DIAGNOSIS — E039 Hypothyroidism, unspecified: Secondary | ICD-10-CM

## 2020-05-31 DIAGNOSIS — Z83719 Family history of colon polyps, unspecified: Secondary | ICD-10-CM | POA: Insufficient documentation

## 2020-05-31 DIAGNOSIS — G250 Essential tremor: Secondary | ICD-10-CM

## 2020-05-31 DIAGNOSIS — E782 Mixed hyperlipidemia: Secondary | ICD-10-CM | POA: Diagnosis not present

## 2020-05-31 DIAGNOSIS — Z6835 Body mass index (BMI) 35.0-35.9, adult: Secondary | ICD-10-CM

## 2020-05-31 DIAGNOSIS — F419 Anxiety disorder, unspecified: Secondary | ICD-10-CM

## 2020-05-31 DIAGNOSIS — Z8371 Family history of colonic polyps: Secondary | ICD-10-CM | POA: Insufficient documentation

## 2020-06-14 ENCOUNTER — Ambulatory Visit: Payer: No Typology Code available for payment source | Admitting: Internal Medicine

## 2020-06-18 ENCOUNTER — Encounter (INDEPENDENT_AMBULATORY_CARE_PROVIDER_SITE_OTHER): Payer: Self-pay | Admitting: Bariatrics

## 2020-06-18 ENCOUNTER — Ambulatory Visit (INDEPENDENT_AMBULATORY_CARE_PROVIDER_SITE_OTHER): Payer: No Typology Code available for payment source | Admitting: Bariatrics

## 2020-06-18 ENCOUNTER — Other Ambulatory Visit: Payer: Self-pay

## 2020-06-18 VITALS — BP 137/85 | HR 73 | Temp 98.3°F | Ht 67.0 in | Wt 224.0 lb

## 2020-06-18 DIAGNOSIS — Z9189 Other specified personal risk factors, not elsewhere classified: Secondary | ICD-10-CM

## 2020-06-18 DIAGNOSIS — Z1331 Encounter for screening for depression: Secondary | ICD-10-CM

## 2020-06-18 DIAGNOSIS — G4733 Obstructive sleep apnea (adult) (pediatric): Secondary | ICD-10-CM

## 2020-06-18 DIAGNOSIS — R0602 Shortness of breath: Secondary | ICD-10-CM

## 2020-06-18 DIAGNOSIS — R5383 Other fatigue: Secondary | ICD-10-CM | POA: Diagnosis not present

## 2020-06-18 DIAGNOSIS — E538 Deficiency of other specified B group vitamins: Secondary | ICD-10-CM | POA: Diagnosis not present

## 2020-06-18 DIAGNOSIS — E559 Vitamin D deficiency, unspecified: Secondary | ICD-10-CM | POA: Diagnosis not present

## 2020-06-18 DIAGNOSIS — D509 Iron deficiency anemia, unspecified: Secondary | ICD-10-CM

## 2020-06-18 DIAGNOSIS — R7309 Other abnormal glucose: Secondary | ICD-10-CM

## 2020-06-18 DIAGNOSIS — M172 Bilateral post-traumatic osteoarthritis of knee: Secondary | ICD-10-CM

## 2020-06-18 DIAGNOSIS — Z6835 Body mass index (BMI) 35.0-35.9, adult: Secondary | ICD-10-CM

## 2020-06-18 DIAGNOSIS — Z0289 Encounter for other administrative examinations: Secondary | ICD-10-CM

## 2020-06-18 DIAGNOSIS — E039 Hypothyroidism, unspecified: Secondary | ICD-10-CM

## 2020-06-19 ENCOUNTER — Encounter: Payer: Self-pay | Admitting: Internal Medicine

## 2020-06-19 ENCOUNTER — Ambulatory Visit (INDEPENDENT_AMBULATORY_CARE_PROVIDER_SITE_OTHER): Payer: No Typology Code available for payment source | Admitting: Internal Medicine

## 2020-06-19 ENCOUNTER — Encounter (INDEPENDENT_AMBULATORY_CARE_PROVIDER_SITE_OTHER): Payer: Self-pay | Admitting: Bariatrics

## 2020-06-19 VITALS — BP 128/88 | HR 75 | Ht 67.0 in | Wt 228.6 lb

## 2020-06-19 DIAGNOSIS — T466X5D Adverse effect of antihyperlipidemic and antiarteriosclerotic drugs, subsequent encounter: Secondary | ICD-10-CM | POA: Diagnosis not present

## 2020-06-19 DIAGNOSIS — E785 Hyperlipidemia, unspecified: Secondary | ICD-10-CM

## 2020-06-19 DIAGNOSIS — E668 Other obesity: Secondary | ICD-10-CM

## 2020-06-19 DIAGNOSIS — M791 Myalgia, unspecified site: Secondary | ICD-10-CM

## 2020-06-19 DIAGNOSIS — T466X5A Adverse effect of antihyperlipidemic and antiarteriosclerotic drugs, initial encounter: Secondary | ICD-10-CM

## 2020-06-19 LAB — INSULIN, RANDOM: INSULIN: 6.9 u[IU]/mL (ref 2.6–24.9)

## 2020-06-19 LAB — FERRITIN: Ferritin: 122 ng/mL (ref 15–150)

## 2020-06-19 LAB — VITAMIN B12: Vitamin B-12: 967 pg/mL (ref 232–1245)

## 2020-06-19 NOTE — Progress Notes (Signed)
Chief Complaint:   OBESITY Tricia Miranda (MR# [id number]) is a 55 y.o. female who presents for evaluation and treatment of obesity and related comorbidities. Current BMI is Body mass index is 35.08 kg/m. Tricia Miranda has been struggling with her weight for many years and has been unsuccessful in either losing weight, maintaining weight loss, or reaching her healthy weight goal.  Tricia Miranda is currently in the action stage of change and ready to dedicate time achieving and maintaining a healthier weight. Tricia Miranda is interested in becoming our patient and working on intensive lifestyle modifications including (but not limited to) diet and exercise for weight loss.  Tricia Miranda like to [employer] but states that it'll take a lot of time. She skips breakfast often.   Tricia Miranda's habits were reviewed today and are as follows: Her family eats meals together, her desired weight loss is 69 lbs, she started gaining weight upon moving to [facility] in 2007, her heaviest weight ever was 69 pounds, she snacks frequently in the evenings, she skips meals frequently, she is frequently drinking liquids with calories, she frequently eats larger portions than normal and she struggles with emotional eating.  Depression Screen Tricia Miranda's Food and Mood (modified PHQ-9) score was 10.  Depression screen PHQ 2/9 06/18/2020  Decreased Interest 2  Down, Depressed, Hopeless 1  PHQ - 2 Score 3  Altered sleeping 1  Tired, decreased energy 2  Change in appetite 2  Feeling bad or failure about yourself  1  Trouble concentrating 0  Moving slowly or fidgety/restless 1  Suicidal thoughts 0  PHQ-9 Score 10  Difficult doing work/chores Somewhat difficult   Subjective:   1. Other fatigue Tricia Miranda admits to daytime somnolence and admits to waking up still tired. Patent has a history of symptoms of morning headache. Tricia Miranda generally gets 7 or 8 hours of sleep per night, and states that she has generally restful sleep. Snoring is present. Apneic  episodes are present. Epworth Sleepiness Score is 3.  2. SOB (shortness of breath) on exertion Tricia Miranda notes increasing shortness of breath with exercising and seems to be worsening over time with weight gain. She notes getting out of breath sooner with activity than she used to. This has gotten worse recently. [name] denies shortness of breath at rest or orthopnea.  3. Vitamin D deficiency Tricia Miranda is taking OTC Vit D3.   Ref. Range 05/28/2020 11:45  Vitamin D, 25-Hydroxy Latest Ref Range: 30 - 100 ng/mL 43   4. Vitamin B 12 deficiency Tricia Miranda is not taking a B12 supplement.  5. Elevated glucose Tricia Miranda has a history of elevated glucose readings. Her last A1c was 5.2.   Ref. Range 05/28/2020 11:45  Hemoglobin A1C Latest Ref Range: <5.7 % of total Hgb 5.2   6. Iron deficiency anemia, unspecified iron deficiency anemia type Tricia Miranda takes ferrous sulfate occasionally.  7. Hypothyroidism, unspecified type Tricia Miranda last TSH was within normal limits. She is taking Synthroid 112 mcg daily.  8. Post-traumatic osteoarthritis of both knees Tricia Miranda has had Synvisc injections for bilateral osteoarthritis.  9. OSA (obstructive sleep apnea) Tricia Miranda does not wear a CPAP at night, as she uses a dental device for sleep apnea.  11. At risk for activity intolerance Tricia Miranda is at risk for exercise intolerance due to obesity.  Assessment/Plan:   1. Other fatigue Tricia Miranda does feel that her weight is causing her energy to be lower than it should be. Fatigue may be related to obesity, depression or many other causes. Labs will be ordered,  and in the meanwhile, Tricia Miranda will focus on self care including making healthy food choices, increasing physical activity and focusing on stress reduction. - EKG 12-Lead  2. SOB (shortness of breath) on exertion Tricia Miranda does feel that she gets out of breath more easily that she used to when she exercises. Tricia Miranda's shortness of breath appears to be obesity related and exercise induced. She has  agreed to work on weight loss and gradually increase exercise to treat her exercise induced shortness of breath. Will continue to monitor closely.  3. Vitamin D deficiency Low Vitamin D level contributes to fatigue and are associated with obesity, breast, and colon cancer. She agrees to continue to take OTC Vitamin D daily and will follow-up for routine testing of Vitamin D, at least 2-3 times per year to avoid over-replacement.  4. Vitamin B 12 deficiency The diagnosis was reviewed with the patient. Counseling provided today, see below. We will continue to monitor. Orders and follow up as documented in patient record. Check labs today.  Counseling . The body needs vitamin B12: to make red blood cells; to make DNA; and to help the nerves work properly so they can carry messages from the brain to the body.  . The main causes of vitamin B12 deficiency include dietary deficiency, digestive diseases, pernicious anemia, and having a surgery in which part of the stomach or small intestine is removed.  . Certain medicines can make it harder for the body to absorb vitamin B12. These medicines include: heartburn medications; some antibiotics; some medications used to treat diabetes, gout, and high cholesterol.  . In some cases, there are no symptoms of this condition. If the condition leads to anemia or nerve damage, various symptoms can occur, such as weakness or fatigue, shortness of breath, and numbness or tingling in your hands and feet.   . Treatment:  o May include taking vitamin B12 supplements.  o Avoid alcohol.  o Eat lots of healthy foods that contain vitamin B12: - Beef, pork, chicken, [place], and organ meats, such as liver.  - Seafood: This includes clams, rainbow trout, salmon, tuna, and haddock. Eggs.  - Cereal and dairy products that are fortified: This means that vitamin B12 has been added to the food.   - Vitamin B12  5. Elevated glucose Fasting labs will be obtained and results with  be discussed with Tricia Miranda in 2 weeks at her follow up visit. In the meanwhile Tricia Miranda was started on a lower simple carbohydrate diet and will work on weight loss efforts. Check labs today.  - Insulin, random  6. Iron deficiency anemia, unspecified iron deficiency anemia type Continue ferrous sulfate. Check labs today.  - ferrous sulfate 325 (65 FE) MG EC tablet; Take 325 mg by mouth 3 (three) times daily with meals. Pt takes 1 tablet every other day  - Ferritin  7. Hypothyroidism, unspecified type Patient with long-standing hypothyroidism, on levothyroxine therapy. She appears euthyroid. Orders and follow up as documented in patient record. Continue current treatment plan.  Counseling . Good thyroid control is important for overall health. Supratherapeutic thyroid levels are dangerous and will not improve weight loss results. . The correct way to take levothyroxine is fasting, with water, separated by at least 30 minutes from breakfast, and separated by more than 4 hours from calcium, iron, multivitamins, acid reflux medications (PPIs).    8. Post-traumatic osteoarthritis of both knees No pulling exercises.  9. OSA (obstructive sleep apnea) Intensive lifestyle modifications are the first line treatment for this  issue. We discussed several lifestyle modifications today and she will continue to work on diet, exercise and weight loss efforts. We will continue to monitor. Orders and follow up as documented in patient record. Tricia Miranda will continue to wear dental devise.  10. Depression screening Tricia Miranda had a positive depression screening. Depression is commonly associated with obesity and often results in emotional eating behaviors. We will monitor this closely and work on CBT to help improve the non-hunger eating patterns. Referral to Psychology may be required if no improvement is seen as she continues in our clinic.  11. At risk for activity intolerance Tricia Miranda was given approximately 15 minutes  of exercise intolerance counseling today. She is 55 y.o. female and has risk factors exercise intolerance including obesity. We discussed intensive lifestyle modifications today with an emphasis on specific weight loss instructions and strategies. Tricia Miranda will slowly increase activity as tolerated.  Repetitive spaced learning was employed today to elicit superior memory formation and behavioral change.  12. Class 2 severe obesity due to excess calories with serious comorbidity and body mass index (BMI) of 35.0 to 35.9 in adult [facility]) Tricia Miranda is currently in the action stage of change and her goal is to continue with weight loss efforts. I recommend Tricia Miranda begin the structured treatment plan as follows:  Reviewed labs with patient.  She has agreed to the Category 3 Plan.  Exercise goals: No exercise has been prescribed at this time.   Behavioral modification strategies: increasing lean protein intake, decreasing simple carbohydrates, increasing vegetables, increasing water intake, decreasing eating out, no skipping meals, meal planning and cooking strategies, keeping healthy foods in the home and planning for success.  She was informed of the importance of frequent follow-up visits to maximize her success with intensive lifestyle modifications for her multiple health conditions. She was informed we would discuss her lab results at her next visit unless there is a critical issue that needs to be addressed sooner. Damesha agreed to keep her next visit at the agreed upon time to discuss these results.  Objective:   Blood pressure 137/85, pulse 73, temperature 98.3 F (36.8 C), height 5\' 7"  (1.702 m), weight 224 lb (101.6 kg), last menstrual period 11/15/2018, SpO2 96 %. Body mass index is 35.08 kg/m.  EKG: Normal sinus rhythm, rate 69.  Indirect Calorimeter completed today shows a VO2 of 277 and a REE of 1926.  Her calculated basal metabolic rate is 1027 thus her basal metabolic rate is better than  expected.  General: Cooperative, alert, well developed, in no acute distress. HEENT: Conjunctivae and lids unremarkable. Cardiovascular: Regular rhythm.  Lungs: Normal work of breathing. Neurologic: No focal deficits.   Lab Results  Component Value Date   CREATININE 0.52 05/28/2020   BUN 11 05/28/2020   NA 139 05/28/2020   K 4.4 05/28/2020   CL 102 05/28/2020   CO2 29 05/28/2020   Lab Results  Component Value Date   ALT 23 05/28/2020   AST 15 05/28/2020   ALKPHOS 72 11/08/2015   BILITOT 0.5 05/28/2020   Lab Results  Component Value Date   HGBA1C 5.2 05/28/2020   HGBA1C 5.1 11/28/2019   HGBA1C 5.2 09/20/2014   HGBA1C 5.3 10/07/2010   Lab Results  Component Value Date   INSULIN WILL FOLLOW 06/18/2020   Lab Results  Component Value Date   TSH 1.25 05/28/2020   Lab Results  Component Value Date   CHOL 239 (H) 05/28/2020   HDL 56 05/28/2020   LDLCALC 155 (H) 05/28/2020  TRIG 151 (H) 05/28/2020   CHOLHDL 4.3 05/28/2020   Lab Results  Component Value Date   WBC 6.7 05/28/2020   HGB 14.1 05/28/2020   HCT 41.5 05/28/2020   MCV 83.0 05/28/2020   PLT 306 05/28/2020   Lab Results  Component Value Date   IRON 47 01/15/2017   TIBC 448 01/15/2017   FERRITIN 122 06/18/2020    Attestation Statements:   Reviewed by clinician on day of visit: allergies, medications, problem list, medical history, surgical history, family history, social history, and previous encounter notes.  Coral Ceo, am acting as Location manager for [employer], DO.  I have reviewed the above documentation for accuracy and completeness, and I agree with the above. Jearld Lesch, DO

## 2020-06-19 NOTE — Patient Instructions (Signed)
Medication Instructions:  Your physician recommends that you continue on your current medications as directed. Please refer to the Current Medication list given to you today *If you need a refill on your cardiac medications before your next appointment, please call your pharmacy*   Lab Work: FASTING lipid panel in 6 months Complete about 1 week before your next visit  If you have labs (blood work) drawn today and your tests are completely normal, you will receive your results only by: Marland Kitchen MyChart Message (if you have MyChart) OR . A paper copy in the mail If you have any lab test that is abnormal or we need to change your treatment, we will call you to review the results.   Testing/Procedures: NONE   Follow-Up: At [facility], you and your health needs are our priority.  As part of our continuing mission to provide you with exceptional heart care, we have created designated Provider Care Teams.  These Care Teams include your primary Cardiologist (physician) and Advanced Practice Providers (APPs -  Physician Assistants and Nurse Practitioners) who all work together to provide you with the care you need, when you need it.  We recommend signing up for the patient portal called "MyChart".  Sign up information is provided on this After Visit Summary.  MyChart is used to connect with patients for Virtual Visits (Telemedicine).  Patients are able to view lab/test results, encounter notes, upcoming appointments, etc.  Non-urgent messages can be sent to your provider as well.   To learn more about what you can do with MyChart, go to [email or web address].    Your next appointment:   6 month(s) - lipid clinice  The format for your next appointment:   In Person  Provider:   K. Mali Hilty, MD   Other Instructions

## 2020-06-19 NOTE — Progress Notes (Signed)
LIPID CLINIC CONSULT NOTE  Chief Complaint:  Follow-up dyslipidemia  Primary Care Physician: Elby Showers, MD  Primary Cardiologist:  No primary care provider on file.  HPI:  Tricia Miranda is a 55 y.o. female who is being seen today for the evaluation of dyslipidemia at the request of Baxley, Cresenciano Lick, MD. This is a pleasant 54 year old female kindly referred for evaluation management of dyslipidemia.  Her PCP had referred her due to progressively worsening dyslipidemia for management recommendations.  According to [name] she has had longstanding issues with high cholesterol and had previously at one point been on simvastatin which she said caused significant joint aches and feeling of overall muscle soreness.  Subsequently in the past had been changed over to rosuvastatin 5 mg daily which she said she took for a number of years without any significant side effects but then started to develop more significant muscle and joint aches including feelings of muscle pulsations.  Her rosuvastatin was then decreased to a point where she was only on it twice weekly.  Her LDL a year ago was 115 but has since risen and currently is 141, total cholesterol 229, HDL 50 and triglycerides 249.  This data was 3 months ago and she reports that she has been off of her statin medication since that time so I expect that her cholesterol may be higher.  Fortunately, it appears that she has few other cardiovascular risk factors.  She does not have any diabetes, hypertension, known coronary disease or prior to cardiovascular events.  There is heart disease in her father who had two-vessel bypass but was a smoker and her mother reportedly had very high cholesterol she says in the 400s and was on medication for a while but then was told to come off of it after heart catheterization showed no significant coronary disease but subsequently then was restarted on it.  She reports a variable but generally low saturated fat  diet with fish, chicken and vegetables and other healthy sources.  06/19/2020  Tricia Miranda returns today for follow-up. She underwent coronary calcium scoring in November which showed 0 coronary calcium however she was noted to have hepatic steatosis. She has been working on weight loss and was referred to the [facility] health weight management center. She did have repeat lipids however they are slightly higher. Total cholesterol now 239, HDL 56, triglycerides 151 and LDL 155 (up from 143). She said is actually been a difficult couple of months having to care for her husband who injured his leg while fishing.   PMHx:  Past Medical History:  Diagnosis Date  . Anxiety   . Anxiety   . Bronchitis   . Cancer ([place])    basal cell- tiny  . Cancer ([place])   . Fibroids    uterine   . High cholesterol   . Hypothyroidism   . Joint pain   . Low iron   . Low vitamin D level   . Osteoarthritis   . Sleep apnea    uses oral device for mild sleep apnea   . Thyroid disease    hypothyroidism  . Vitamin B deficiency     Past Surgical History:  Procedure Laterality Date  . ABDOMINAL HYSTERECTOMY    . COLONOSCOPY     with benign  polyp removal   . DILITATION & CURRETTAGE/HYSTROSCOPY WITH VERSAPOINT RESECTION N/A 03/16/2014   Procedure: DILATATION & CURETTAGE/HYSTEROSCOPY WITH VERSAPOINT RESECTION;  Surgeon: [name], MD;  Location: [facility];  Service: Gynecology;  Laterality: N/A;  1 hr.  . HYSTERECTOMY ABDOMINAL WITH SALPINGECTOMY Bilateral 11/15/2018   Procedure: HYSTERECTOMY ABDOMINAL WITH  BILATERAL SALPINGECTOMY;  Surgeon: [name], MD;  Location: [facility];  Service: Gynecology;  Laterality: Bilateral;  . MOHS SURGERY Left a while- unknown   under left eyelid  . WISDOM TOOTH EXTRACTION      FAMHx:  Family History  Problem Relation Age of Onset  . High Cholesterol Mother   . Sleep apnea Mother   . Heart disease Father   . Alcohol abuse Father   . Anxiety  disorder Father   . Stomach cancer Other   . Colon cancer Neg Hx   . Esophageal cancer Neg Hx   . Pancreatic cancer Neg Hx   . Prostate cancer Neg Hx   . Rectal cancer Neg Hx     SOCHx:   reports that she has never smoked. She has never used smokeless tobacco. She reports current alcohol use of about 3.0 standard drinks of alcohol per week. She reports that she does not use drugs.  ALLERGIES:  No Known Allergies  ROS: Pertinent items noted in HPI and remainder of comprehensive ROS otherwise negative.  HOME MEDS: Current Outpatient Medications on File Prior to Visit  Medication Sig Dispense Refill  . ALPRAZolam (XANAX) 0.25 MG tablet TAKE ONE TABLET BY MOUTH TWICE A DAY AS NEEDED 60 tablet 5  . cetirizine (ZYRTEC) 10 MG tablet Take 10 mg by mouth daily as needed for allergies.    . cyanocobalamin 1000 MCG tablet Take 1,000 mcg by mouth daily.    . ergocalciferol (DRISDOL) 1.25 MG (50000 UT) capsule Take 1 capsule (50,000 Units total) by mouth once a week. 12 capsule 3  . ferrous sulfate 325 (65 FE) MG EC tablet Take 325 mg by mouth 3 (three) times daily with meals. Pt takes 1 tablet every other day    . FLUoxetine (PROZAC) 10 MG capsule TAKE ONE CAPSULE BY MOUTH DAILY (Patient taking differently: Take 10 mg by mouth daily.) 90 capsule 2  . SYNTHROID 112 MCG tablet TAKE ONE TABLET BY MOUTH EVERY MORNING BEFORE BREAKFAST 90 tablet 2   No current facility-administered medications on file prior to visit.    LABS/IMAGING: Results for orders placed or performed in visit on 06/18/20 (from the past 48 hour(s))  Ferritin     Status: None   Collection Time: 06/18/20 10:59 AM  Result Value Ref Range   Ferritin 122 15 - 150 ng/mL  Vitamin B12     Status: None   Collection Time: 06/18/20 10:59 AM  Result Value Ref Range   Vitamin B-12 967 232 - 1,245 pg/mL  Insulin, random     Status: None (Preliminary result)   Collection Time: 06/18/20 10:59 AM  Result Value Ref Range   INSULIN WILL  FOLLOW    No results found.  LIPID PANEL:    Component Value Date/Time   CHOL 239 (H) 05/28/2020 1145   TRIG 151 (H) 05/28/2020 1145   HDL 56 05/28/2020 1145   CHOLHDL 4.3 05/28/2020 1145   VLDL 26 11/08/2015 1026   LDLCALC 155 (H) 05/28/2020 1145    WEIGHTS: Wt Readings from Last 3 Encounters:  06/19/20 228 lb 9.6 oz (103.7 kg)  06/18/20 224 lb (101.6 kg)  05/31/20 229 lb (103.9 kg)    VITALS: BP 128/88 (BP Location: Left Arm, Patient Position: Sitting)   Pulse 75   Ht 5\' 7"  (7.062 m)   Wt  228 lb 9.6 oz (103.7 kg)   LMP 11/15/2018   SpO2 97%   BMI 35.80 kg/m   EXAM: Deferred  EKG: Deferred  ASSESSMENT: 1. Mixed dyslipidemia 2. Statin intolerance - myalgias 3. Family history of high cholesterol 4. Moderate obesity 5. Zero CAC (02/2020)-hepatic steatosis  PLAN: 1.   Tricia Miranda some weight gain and increase in cholesterol. She is now working with a [facility] weight management center. She would like more time to work on weight loss and dietary changes. We will plan repeat lipids in about 6 months. We might consider an alternative low-dose statin or ezetimibe at that time.  Pixie Casino, MD, [facility], [place] Director of the Advanced Lipid Disorders &  Cardiovascular Risk Reduction Clinic Diplomate of the American Board of Clinical Lipidology Attending Cardiologist  Direct Dial: [phone]  Fax: [phone]  Website:  [email or web address] 06/19/2020, 8:57 AM

## 2020-06-20 ENCOUNTER — Other Ambulatory Visit: Payer: Self-pay | Admitting: Internal Medicine

## 2020-07-02 ENCOUNTER — Other Ambulatory Visit: Payer: Self-pay

## 2020-07-02 ENCOUNTER — Ambulatory Visit (INDEPENDENT_AMBULATORY_CARE_PROVIDER_SITE_OTHER): Payer: No Typology Code available for payment source | Admitting: Bariatrics

## 2020-07-02 ENCOUNTER — Encounter (INDEPENDENT_AMBULATORY_CARE_PROVIDER_SITE_OTHER): Payer: Self-pay | Admitting: Bariatrics

## 2020-07-02 VITALS — BP 136/86 | HR 77 | Temp 97.9°F | Ht 67.0 in | Wt 222.0 lb

## 2020-07-02 DIAGNOSIS — Z9189 Other specified personal risk factors, not elsewhere classified: Secondary | ICD-10-CM

## 2020-07-02 DIAGNOSIS — E038 Other specified hypothyroidism: Secondary | ICD-10-CM | POA: Diagnosis not present

## 2020-07-02 DIAGNOSIS — E669 Obesity, unspecified: Secondary | ICD-10-CM | POA: Diagnosis not present

## 2020-07-02 DIAGNOSIS — E7800 Pure hypercholesterolemia, unspecified: Secondary | ICD-10-CM

## 2020-07-02 DIAGNOSIS — E559 Vitamin D deficiency, unspecified: Secondary | ICD-10-CM

## 2020-07-02 DIAGNOSIS — Z6834 Body mass index (BMI) 34.0-34.9, adult: Secondary | ICD-10-CM

## 2020-07-03 ENCOUNTER — Encounter (INDEPENDENT_AMBULATORY_CARE_PROVIDER_SITE_OTHER): Payer: Self-pay | Admitting: Bariatrics

## 2020-07-03 NOTE — Progress Notes (Signed)
Chief Complaint:   OBESITY Tricia Miranda is here to discuss her progress with her obesity treatment plan along with follow-up of her obesity related diagnoses. Tricia Miranda is on the Category 3 Plan and states she is following her eating plan approximately 95% of the time. Tricia Miranda states she is not exercising regularly at this time.  Today's visit was #: 2 Starting weight: 224 lbs Starting date: 06/18/2020 Today's weight: 222 lbs Today's date: 07/02/2020 Total lbs lost to date: 2 lbs Total lbs lost since last in-office visit: 2 lbs  Interim History: Tricia Miranda is down 2 pounds since her last visit.  She states that the plan was okay and she is skeptical about the diet.  She has not felt the urge to eat.  Subjective:   1. Other specified hypothyroidism Tricia Miranda is taking Synthroid 112 mcg daily.  Lab Results  Component Value Date   TSH 1.25 05/28/2020   2. Vitamin D deficiency Tricia Miranda's Vitamin D level was 43.0 on 05/28/2020. She is currently taking prescription vitamin D 50,000 IU each week. She denies nausea, vomiting or muscle weakness.  3. Elevated cholesterol Tricia Miranda has hyperlipidemia and has been trying to improve her cholesterol levels with intensive lifestyle modification including a low saturated fat diet, exercise and weight loss. She denies any chest pain, claudication or myalgias.  Cholesterol level 239, LDL 155.  Used to take Crestor, but PCP stopped it due to joint pain.  Calcium CT showed a risk of 0.  Lab Results  Component Value Date   ALT 23 05/28/2020   AST 15 05/28/2020   ALKPHOS 72 11/08/2015   BILITOT 0.5 05/28/2020   Lab Results  Component Value Date   CHOL 239 (H) 05/28/2020   HDL 56 05/28/2020   LDLCALC 155 (H) 05/28/2020   TRIG 151 (H) 05/28/2020   CHOLHDL 4.3 05/28/2020   4. At risk for constipation Tricia Miranda is at increased risk for constipation due to increasing protein in diet and taking an iron supplement, changes in diet, and/or use of medications such as GLP1  agonists. Tricia Miranda denies hard, infrequent stools currently.   Assessment/Plan:   1. Other specified hypothyroidism Patient with long-standing hypothyroidism, on levothyroxine therapy. She appears euthyroid. Orders and follow up as documented in patient record.  Will continue diet and exercise.  Follow-up with PCP and cardiologist.  Continue Synthroid.  Counseling . Good thyroid control is important for overall health. Supratherapeutic thyroid levels are dangerous and will not improve weight loss results. . The correct way to take levothyroxine is fasting, with water, separated by at least 30 minutes from breakfast, and separated by more than 4 hours from calcium, iron, multivitamins, acid reflux medications (PPIs).   2. Vitamin D deficiency Low Vitamin D level contributes to fatigue and are associated with obesity, breast, and colon cancer. She agrees to continue to take prescription Vitamin D @50 ,000 IU every week and will follow-up for routine testing of Vitamin D, at least 2-3 times per year to avoid over-replacement.  3. Elevated cholesterol Cardiovascular risk and specific lipid/LDL goals reviewed.  We discussed several lifestyle modifications today and Maytte will continue to work on diet, exercise and weight loss efforts. Orders and follow up as documented in patient record.   Counseling Intensive lifestyle modifications are the first line treatment for this issue. . Dietary changes: Increase soluble fiber. Decrease simple carbohydrates. . Exercise changes: Moderate to vigorous-intensity aerobic activity 150 minutes per week if tolerated. . Lipid-lowering medications: see documented in medical record.  4.  At risk for constipation Tricia Miranda was given approximately 15 minutes of counseling today regarding prevention of constipation. She was encouraged to increase water and fiber intake.   5. Class 1 obesity with serious comorbidity and body mass index (BMI) of 34.0 to 34.9 in adult,  unspecified obesity type  Tricia Miranda is currently in the action stage of change. As such, her goal is to continue with weight loss efforts. She has agreed to the Category 3 Plan.   She will work on meal planning, will adhere closely to the plan.  Labs from 05/28/2020, including CMP, lipid panel, ferritin, vitamin D, B12, CBC, A1c, insulin level, and TSH, were reviewed today.    Handouts provided:  "Calorie and Protein Goals".  "On the Road".  Exercise goals: No exercise has been prescribed at this time.  Behavioral modification strategies: increasing lean protein intake, decreasing simple carbohydrates, increasing vegetables, increasing water intake, decreasing eating out, no skipping meals, meal planning and cooking strategies, keeping healthy foods in the home and planning for success.  Tricia Miranda has agreed to follow-up with our clinic in 2 weeks. She was informed of the importance of frequent follow-up visits to maximize her success with intensive lifestyle modifications for her multiple health conditions.   Objective:   Blood pressure 136/86, pulse 77, temperature 97.9 F (36.6 C), height 5\' 7"  (1.702 m), weight 222 lb (100.7 kg), last menstrual period 11/15/2018, SpO2 99 %. Body mass index is 34.77 kg/m.  General: Cooperative, alert, well developed, in no acute distress. HEENT: Conjunctivae and lids unremarkable. Cardiovascular: Regular rhythm.  Lungs: Normal work of breathing. Neurologic: No focal deficits.   Lab Results  Component Value Date   CREATININE 0.52 05/28/2020   BUN 11 05/28/2020   NA 139 05/28/2020   K 4.4 05/28/2020   CL 102 05/28/2020   CO2 29 05/28/2020   Lab Results  Component Value Date   ALT 23 05/28/2020   AST 15 05/28/2020   ALKPHOS 72 11/08/2015   BILITOT 0.5 05/28/2020   Lab Results  Component Value Date   HGBA1C 5.2 05/28/2020   HGBA1C 5.1 11/28/2019   HGBA1C 5.2 09/20/2014   HGBA1C 5.3 10/07/2010   Lab Results  Component Value Date   INSULIN 6.9  06/18/2020   Lab Results  Component Value Date   TSH 1.25 05/28/2020   Lab Results  Component Value Date   CHOL 239 (H) 05/28/2020   HDL 56 05/28/2020   LDLCALC 155 (H) 05/28/2020   TRIG 151 (H) 05/28/2020   CHOLHDL 4.3 05/28/2020   Lab Results  Component Value Date   WBC 6.7 05/28/2020   HGB 14.1 05/28/2020   HCT 41.5 05/28/2020   MCV 83.0 05/28/2020   PLT 306 05/28/2020   Lab Results  Component Value Date   IRON 47 01/15/2017   TIBC 448 01/15/2017   FERRITIN 122 06/18/2020   Attestation Statements:   Reviewed by clinician on day of visit: allergies, medications, problem list, medical history, surgical history, family history, social history, and previous encounter notes.  I, Water quality scientist, CMA, am acting as Location manager for [employer], DO  I have reviewed the above documentation for accuracy and completeness, and I agree with the above. Jearld Lesch, DO

## 2020-07-05 ENCOUNTER — Telehealth: Payer: Self-pay | Admitting: Orthopedic Surgery

## 2020-07-05 NOTE — Telephone Encounter (Signed)
Patient called advised she would like to get the synvisc injection in both knees. The number to contact patient is [phone]

## 2020-07-05 NOTE — Telephone Encounter (Signed)
Please submit for PA 

## 2020-07-07 NOTE — Patient Instructions (Addendum)
Continue follow-up with lipid clinic.  Recommend continued follow up at healthy weight clinic.  Try to start walking for exercise.  Return in 6 months.  No change in medications  from my standpoint.

## 2020-07-07 NOTE — Progress Notes (Signed)
Subjective:    Patient ID: Tricia Miranda, female    DOB: [birth year], 55 y.o.   MRN: [id number]  HPI 55 year old Female seen for health maintenance exam and evaluation of medical issues.  History of TAH/BSO in July 2020.  History of sleep apnea treated with an oral appliance.  History of vitamin D deficiency treated with high-dose vitamin D supplement.  History of hypothyroidism, anxiety, basal cell carcinoma, essential tremor, hyperlipidemia treated with statin medication, fibrocystic breast disease, history of migraine headaches.  She underwent coronary calcium scoring in November 2021 showed score of 0 but she was noted to have hepatic steatosis.  Non-smoker.  Social alcohol consumption consisting of about 3 drinks of alcohol per week.    Issues with statin intolerance due to myalgias.  Will be seeing Dr. Debara Pickett with regard to how to proceed.  Patient has history of obesity.  Has worked with [facility].  Husband was injured in a fishing accident and has had prolonged recovery which is required for time.  Family history of hyperlipidemia.  Past medical history: Right comminuted humerus fracture October 2007.  Left fibula fracture March 2009.  She had a neurology evaluation while she was living in [place] a number of years ago and developed a fine tremor.  She had MRI of the brain and nerve conduction testing.  Fine tremor never progressed.  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  She is married to an attorney who teaches at [employer].  1 son.  Does not smoke.  Does not get a lot of exercise.  Family history: Father with history of MI and alcoholism.  Mother in good health.  1 sister with history of hip dysplasia.  Maternal grandmother with history of adult onset diabetes.  Since August total cholesterol has increased from 2 25-2 39.  Triglycerides are about the same at 150 and 151 respectively.  HDL has not  changed very much.  LDL has increased from 143 to 155.  Fasting glucose is 103.  Hemoglobin A1c 5.2%.  TSH is normal.        Review of Systems see above-obviously has had situational stress with husband over the past few weeks.  He is improving from his accident.     Objective:   Physical Exam Blood pressure is excellent 120/80 pulse 74 pulse oximetry 96% weight 229 pounds height 5 feet 7 inches BMI 35.87  Skin is warm and dry.  No cervical adenopathy.  No thyromegaly.  No carotid bruits.  TMs are clear.  Chest clear to auscultation.  Cardiac exam: Regular rate and rhythm normal S1 and S2.  Breast without masses.  Abdomen is soft nondistended without hepatosplenomegaly masses or tenderness.  No lower extremity pitting edema.  Affect, thought, judgment are normal.  Neurological exam is without focal deficits.       Assessment & Plan:  Patient has osteoarthritis of right knee and has seen orthopedist  Hypothyroidism-TSH is stable on current dose of thyroid replacement therapy  Hyperlipidemia-will be referred to lipid clinic because she is having some myalgias on statin medication.  BMI has improved from last year when it was 52.  Have suggested [facility]  History of anxiety treated with Xanax for many years.  Very fine resting tremor longstanding not very noticeable  History of Vitamin D deficiency-is on high-dose vitamin D supplement and level is stable at 43.  Deconditioning-has worked with integrative therapies  regarding this in 2021.  Status post hysterectomy BSO  History of COVID-08 May 2020  Plan: Continue to be seen by Dr. Debara Pickett at lipid clinic due to statin issues and also recommend [facility].Began seeing Dr. Debara Pickett November 2021.  Return in 6 months.

## 2020-07-08 NOTE — Telephone Encounter (Signed)
Noted  

## 2020-07-16 ENCOUNTER — Other Ambulatory Visit: Payer: Self-pay

## 2020-07-16 ENCOUNTER — Ambulatory Visit (INDEPENDENT_AMBULATORY_CARE_PROVIDER_SITE_OTHER): Payer: No Typology Code available for payment source | Admitting: Bariatrics

## 2020-07-16 ENCOUNTER — Encounter (INDEPENDENT_AMBULATORY_CARE_PROVIDER_SITE_OTHER): Payer: Self-pay | Admitting: Bariatrics

## 2020-07-16 VITALS — BP 131/84 | HR 69 | Temp 98.0°F | Ht 67.0 in | Wt 223.0 lb

## 2020-07-16 DIAGNOSIS — Z6835 Body mass index (BMI) 35.0-35.9, adult: Secondary | ICD-10-CM

## 2020-07-16 DIAGNOSIS — E559 Vitamin D deficiency, unspecified: Secondary | ICD-10-CM | POA: Diagnosis not present

## 2020-07-16 DIAGNOSIS — G4733 Obstructive sleep apnea (adult) (pediatric): Secondary | ICD-10-CM | POA: Diagnosis not present

## 2020-07-16 DIAGNOSIS — E7800 Pure hypercholesterolemia, unspecified: Secondary | ICD-10-CM | POA: Diagnosis not present

## 2020-07-23 ENCOUNTER — Encounter (INDEPENDENT_AMBULATORY_CARE_PROVIDER_SITE_OTHER): Payer: Self-pay | Admitting: Bariatrics

## 2020-07-23 NOTE — Progress Notes (Signed)
Chief Complaint:   OBESITY Tricia Miranda is here to discuss her progress with her obesity treatment plan along with follow-up of her obesity related diagnoses. Tricia Miranda is on the Category 3 Plan and states she is following her eating plan approximately 90% of the time. Tricia Miranda states she is walking for 30 minutes 4-5 times per week.  Today's visit was #: 3 Starting weight: 224 lbs Starting date: 06/18/2020 Today's weight: 223 lbs Today's date: 07/16/2020 Total lbs lost to date: 1 lb Total lbs lost since last in-office visit: 0  Interim History: Tricia Miranda is up 1 pound.  She thinks that things are going well.  Subjective:   1. Vitamin D deficiency Tricia Miranda Vitamin D level was 43.0 on 05/28/2020. She is currently taking prescription vitamin D 50,000 IU each week. She denies nausea, vomiting or muscle weakness.  2. Elevated cholesterol Tricia Miranda has hyperlipidemia and has been trying to improve her cholesterol levels with intensive lifestyle modification including a low saturated fat diet, exercise and weight loss. She denies any chest pain, claudication or myalgias.  No medication.  Lab Results  Component Value Date   ALT 23 05/28/2020   AST 15 05/28/2020   ALKPHOS 72 11/08/2015   BILITOT 0.5 05/28/2020   Lab Results  Component Value Date   CHOL 239 (H) 05/28/2020   HDL 56 05/28/2020   LDLCALC 155 (H) 05/28/2020   TRIG 151 (H) 05/28/2020   CHOLHDL 4.3 05/28/2020   3. OSA (obstructive sleep apnea) Tricia Miranda has a diagnosis of sleep apnea. She reports that she is not using a CPAP regularly.  She uses a dental appliance.   Assessment/Plan:   1. Vitamin D deficiency Low Vitamin D level contributes to fatigue and are associated with obesity, breast, and colon cancer. She agrees to continue to take prescription Vitamin D @50 ,000 IU every week and will follow-up for routine testing of Vitamin D, at least 2-3 times per year to avoid over-replacement.  2. Elevated cholesterol Cardiovascular risk and  specific lipid/LDL goals reviewed.  We discussed several lifestyle modifications today and Tricia Miranda will continue to work on diet, exercise and weight loss efforts. Orders and follow up as documented in patient record.   Counseling Intensive lifestyle modifications are the first line treatment for this issue. . Dietary changes: Increase soluble fiber. Decrease simple carbohydrates. . Exercise changes: Moderate to vigorous-intensity aerobic activity 150 minutes per week if tolerated. . Lipid-lowering medications: see documented in medical record.  3. OSA (obstructive sleep apnea) Intensive lifestyle modifications are the first line treatment for this issue. We discussed several lifestyle modifications today and she will continue to work on diet, exercise and weight loss efforts. We will continue to monitor. Orders and follow up as documented in patient record.   4. Obesity, current BMI 34.9  Tricia Miranda is currently in the action stage of change. As such, her goal is to continue with weight loss efforts. She has agreed to keeping a food journal and adhering to recommended goals of 1400 calories and 80-90 grams of protein.   She will work on meal planning, intentional eating, and will consider journaling.  Exercise goals: Walking and will continue to go to the gym.  Behavioral modification strategies: increasing lean protein intake, decreasing simple carbohydrates, increasing vegetables, increasing water intake, decreasing eating out, no skipping meals, meal planning and cooking strategies, keeping healthy foods in the home and planning for success.  Tricia Miranda has agreed to follow-up with our clinic in 2 weeks. She was informed of the  importance of frequent follow-up visits to maximize her success with intensive lifestyle modifications for her multiple health conditions.   Objective:   Blood pressure 131/84, pulse 69, temperature 98 F (36.7 C), height 5\' 7"  (1.702 m), weight 223 lb (101.2 kg), last  menstrual period 11/15/2018, SpO2 95 %. Body mass index is 34.93 kg/m.  General: Cooperative, alert, well developed, in no acute distress. HEENT: Conjunctivae and lids unremarkable. Cardiovascular: Regular rhythm.  Lungs: Normal work of breathing. Neurologic: No focal deficits.   Lab Results  Component Value Date   CREATININE 0.52 05/28/2020   BUN 11 05/28/2020   NA 139 05/28/2020   K 4.4 05/28/2020   CL 102 05/28/2020   CO2 29 05/28/2020   Lab Results  Component Value Date   ALT 23 05/28/2020   AST 15 05/28/2020   ALKPHOS 72 11/08/2015   BILITOT 0.5 05/28/2020   Lab Results  Component Value Date   HGBA1C 5.2 05/28/2020   HGBA1C 5.1 11/28/2019   HGBA1C 5.2 09/20/2014   HGBA1C 5.3 10/07/2010   Lab Results  Component Value Date   INSULIN 6.9 06/18/2020   Lab Results  Component Value Date   TSH 1.25 05/28/2020   Lab Results  Component Value Date   CHOL 239 (H) 05/28/2020   HDL 56 05/28/2020   LDLCALC 155 (H) 05/28/2020   TRIG 151 (H) 05/28/2020   CHOLHDL 4.3 05/28/2020   Lab Results  Component Value Date   WBC 6.7 05/28/2020   HGB 14.1 05/28/2020   HCT 41.5 05/28/2020   MCV 83.0 05/28/2020   PLT 306 05/28/2020   Lab Results  Component Value Date   IRON 47 01/15/2017   TIBC 448 01/15/2017   FERRITIN 122 06/18/2020   Attestation Statements:   Reviewed by clinician on day of visit: allergies, medications, problem list, medical history, surgical history, family history, social history, and previous encounter notes.  Time spent on visit including pre-visit chart review and post-visit care and charting was 20 minutes.   I, Water quality scientist, CMA, am acting as Location manager for [employer], DO  I have reviewed the above documentation for accuracy and completeness, and I agree with the above. Jearld Lesch, DO

## 2020-08-05 ENCOUNTER — Ambulatory Visit (INDEPENDENT_AMBULATORY_CARE_PROVIDER_SITE_OTHER): Payer: No Typology Code available for payment source | Admitting: Bariatrics

## 2020-08-09 ENCOUNTER — Telehealth: Payer: Self-pay

## 2020-08-09 NOTE — Telephone Encounter (Signed)
VOB has been for Gel-One/Durolane for bilateral knee.  Pending BV.

## 2020-08-12 ENCOUNTER — Other Ambulatory Visit: Payer: Self-pay

## 2020-08-12 ENCOUNTER — Encounter (INDEPENDENT_AMBULATORY_CARE_PROVIDER_SITE_OTHER): Payer: Self-pay | Admitting: Adult Health

## 2020-08-12 ENCOUNTER — Ambulatory Visit (INDEPENDENT_AMBULATORY_CARE_PROVIDER_SITE_OTHER): Payer: No Typology Code available for payment source | Admitting: Adult Health

## 2020-08-12 VITALS — BP 126/84 | HR 70 | Temp 98.5°F | Ht 67.0 in | Wt 220.0 lb

## 2020-08-12 DIAGNOSIS — F419 Anxiety disorder, unspecified: Secondary | ICD-10-CM | POA: Diagnosis not present

## 2020-08-12 DIAGNOSIS — E669 Obesity, unspecified: Secondary | ICD-10-CM

## 2020-08-12 DIAGNOSIS — Z6834 Body mass index (BMI) 34.0-34.9, adult: Secondary | ICD-10-CM | POA: Diagnosis not present

## 2020-08-12 DIAGNOSIS — E559 Vitamin D deficiency, unspecified: Secondary | ICD-10-CM | POA: Diagnosis not present

## 2020-08-12 NOTE — Progress Notes (Signed)
Chief Complaint:   OBESITY Tricia Miranda is here to discuss her progress with her obesity treatment plan along with follow-up of her obesity related diagnoses. Tricia Miranda is on keeping a food journal and adhering to recommended goals of 1400 calories and 80-90 protein and states she is following her eating plan approximately 98% of the time. Tricia Miranda states she is not currently exercising.  Today's visit was #: 4 Starting weight: 224 lbs Starting date: 06/18/2020 Today's weight: 220 lbs Today's date: 08/12/2020 Total lbs lost to date: 4 Total lbs lost since last in-office visit: 3  Interim History: Tricia Miranda recently traveled to [place] for 8 days. She focused on protein at each meal and limited consumption of sauces or other food additives. She walked frequently during her [place] trip- estimated 5-7 miles per day. She lost 3 lbs since last OV!  Subjective:   1. Vitamin D deficiency Tricia Miranda's Vitamin D level was 45 on 05/28/2020, which is below goal of 50. She is currently taking prescription vitamin D 50,000 IU each week. She denies nausea, vomiting or muscle weakness.  2. Anxiety Tricia Miranda is well controlled on fluoxetine 10 mg- 1/2 to 1 tab QD. She also uses PRN alprazolam very sparingly. PDMP reviewed- no aberrancies noted.  Assessment/Plan:   1. Vitamin D deficiency Low Vitamin D level contributes to fatigue and are associated with obesity, breast, and colon cancer. She agrees to continue to take prescription Vitamin D @50 ,000 IU every week and will follow-up for routine testing of Vitamin D, at least 2-3 times per year to avoid over-replacement.  2. Anxiety Behavior modification techniques were discussed today to help Tricia Miranda deal with her anxiety.  Orders and follow up as documented in patient record. Continue daily SSRI, PRN Alprazolam, and daily walking.    3. Class 1 obesity with serious comorbidity and body mass index (BMI) of 34.0 to 34.9 in adult, unspecified obesity type Tricia Miranda is currently in  the action stage of change. As such, her goal is to continue with weight loss efforts. She has agreed to keeping a food journal and adhering to recommended goals of 1400 calories and 80-90 protein.   Exercise goals: Walking  Behavioral modification strategies: increasing lean protein intake, decreasing simple carbohydrates, meal planning and cooking strategies and planning for success.  Tricia Miranda has agreed to follow-up with our clinic in 2 weeks. She was informed of the importance of frequent follow-up visits to maximize her success with intensive lifestyle modifications for her multiple health conditions.   Objective:   Blood pressure 126/84, pulse 70, temperature 98.5 F (36.9 C), height 5\' 7"  (1.702 m), weight 220 lb (99.8 kg), last menstrual period 11/15/2018, SpO2 96 %. Body mass index is 34.46 kg/m.  General: Cooperative, alert, well developed, in no acute distress. HEENT: Conjunctivae and lids unremarkable. Cardiovascular: Regular rhythm.  Lungs: Normal work of breathing. Neurologic: No focal deficits.   Lab Results  Component Value Date   CREATININE 0.52 05/28/2020   BUN 11 05/28/2020   NA 139 05/28/2020   K 4.4 05/28/2020   CL 102 05/28/2020   CO2 29 05/28/2020   Lab Results  Component Value Date   ALT 23 05/28/2020   AST 15 05/28/2020   ALKPHOS 72 11/08/2015   BILITOT 0.5 05/28/2020   Lab Results  Component Value Date   HGBA1C 5.2 05/28/2020   HGBA1C 5.1 11/28/2019   HGBA1C 5.2 09/20/2014   HGBA1C 5.3 10/07/2010   Lab Results  Component Value Date   INSULIN 6.9 06/18/2020  Lab Results  Component Value Date   TSH 1.25 05/28/2020   Lab Results  Component Value Date   CHOL 239 (H) 05/28/2020   HDL 56 05/28/2020   LDLCALC 155 (H) 05/28/2020   TRIG 151 (H) 05/28/2020   CHOLHDL 4.3 05/28/2020   Lab Results  Component Value Date   WBC 6.7 05/28/2020   HGB 14.1 05/28/2020   HCT 41.5 05/28/2020   MCV 83.0 05/28/2020   PLT 306 05/28/2020   Lab Results   Component Value Date   IRON 47 01/15/2017   TIBC 448 01/15/2017   FERRITIN 122 06/18/2020     Attestation Statements:   Reviewed by clinician on day of visit: allergies, medications, problem list, medical history, surgical history, family history, social history, and previous encounter notes.  Time spent on visit including pre-visit chart review and post-visit care and charting was 25 minutes.   Coral Ceo, am acting as Location manager for Mina Marble, NP.  I have reviewed the above documentation for accuracy and completeness, and I agree with the above. -  Cambri Plourde d. Rayvon Brandvold, NP-C

## 2020-08-15 ENCOUNTER — Telehealth: Payer: Self-pay

## 2020-08-15 NOTE — Telephone Encounter (Signed)
Approved for Durolane, bilateral knee. Buy & Bill Must meet deductible first Patient will be responsible for 15% OOP. Co-pay of $30.00 PA Approved Valid 08/13/2020- 08/13/2021  Appt. 08/30/2020 with Dr. Marlou Sa

## 2020-08-27 ENCOUNTER — Encounter (INDEPENDENT_AMBULATORY_CARE_PROVIDER_SITE_OTHER): Payer: Self-pay | Admitting: Adult Health

## 2020-08-27 ENCOUNTER — Ambulatory Visit (INDEPENDENT_AMBULATORY_CARE_PROVIDER_SITE_OTHER): Payer: No Typology Code available for payment source | Admitting: Adult Health

## 2020-08-27 ENCOUNTER — Other Ambulatory Visit: Payer: Self-pay

## 2020-08-27 VITALS — BP 123/76 | HR 72 | Temp 97.6°F | Ht 67.0 in | Wt 219.0 lb

## 2020-08-27 DIAGNOSIS — E559 Vitamin D deficiency, unspecified: Secondary | ICD-10-CM | POA: Diagnosis not present

## 2020-08-27 DIAGNOSIS — Z6834 Body mass index (BMI) 34.0-34.9, adult: Secondary | ICD-10-CM | POA: Diagnosis not present

## 2020-08-27 DIAGNOSIS — E669 Obesity, unspecified: Secondary | ICD-10-CM | POA: Diagnosis not present

## 2020-08-27 DIAGNOSIS — F419 Anxiety disorder, unspecified: Secondary | ICD-10-CM

## 2020-08-27 NOTE — Progress Notes (Signed)
Chief Complaint:   OBESITY Tricia Miranda is here to discuss her progress with her obesity treatment plan along with follow-up of her obesity related diagnoses. Tricia Miranda is on keeping a food journal and adhering to recommended goals of 1400 calories and 80-90 g protein and states she is following her eating plan approximately 90-95% of the time. Tricia Miranda states she is not currently exercising consistently.  Today's visit was #: 5 Starting weight: 224 lbs Starting date: 06/18/2020 Today's weight: 219 lbs Today's date: 08/27/2020 Total lbs lost to date: 5 Total lbs lost since last in-office visit: 1  Interim History: On workdays, Tricia Miranda has been following category 3, She prefers to follow the journaling plan over the weekends. She has been loosely tracking intake on weekends. She has enjoyed this hybrid plan.  Subjective:   1. Vitamin D deficiency Tricia Miranda's Vitamin D level was 45 on 05/28/2020, which is just below goal of 50. She is currently taking prescription vitamin D 50,000 IU each week. She denies nausea, vomiting or muscle weakness. Her PCP manages Ergocalciferol.  2. Anxiety PDMP reviewed- no aberrancies noted. Tricia Miranda estimates to use PRN alprazolam 0.25 mg, about 5 times a week-due to increased levels of stress at work. She has been taking fluoxetine 10 mg QD, instead of 1/2 tab.  She feels that increased SSRI dose stabilizes her mood.  Assessment/Plan:   1. Vitamin D deficiency Low Vitamin D level contributes to fatigue and are associated with obesity, breast, and colon cancer. She agrees to continue to take prescription Vitamin D @50 ,000 IU every week and will follow-up for routine testing of Vitamin D, at least 2-3 times per year to avoid over-replacement. Check labs every 3 months.  2. Anxiety Behavior modification techniques were discussed today to help Tricia Miranda deal with her anxiety.  Orders and follow up as documented in patient record. Continue daily SSRI and PRN benzodiazepine.  3.  Class 1 obesity with serious comorbidity and body mass index (BMI) of 34.0 to 34.9 in adult, unspecified obesity type  Tricia Miranda is currently in the action stage of change. As such, her goal is to continue with weight loss efforts. She has agreed to the Category 3 Plan and keeping a food journal and adhering to recommended goals of 1400 calories and 80-90 grams protein.   100:1 ratio cal/protein  Exercise goals: As is  Behavioral modification strategies: increasing lean protein intake, decreasing simple carbohydrates, no skipping meals, meal planning and cooking strategies, better snacking choices, planning for success and keeping a strict food journal.  Tricia Miranda has agreed to follow-up with our clinic in 2 weeks with Dr. Owens Shark. She was informed of the importance of frequent follow-up visits to maximize her success with intensive lifestyle modifications for her multiple health conditions.   Objective:   Blood pressure 123/76, pulse 72, temperature 97.6 F (36.4 C), height 5\' 7"  (1.702 m), weight 219 lb (99.3 kg), last menstrual period 11/15/2018, SpO2 98 %. Body mass index is 34.3 kg/m.  General: Cooperative, alert, well developed, in no acute distress. HEENT: Conjunctivae and lids unremarkable. Cardiovascular: Regular rhythm.  Lungs: Normal work of breathing. Neurologic: No focal deficits.   Lab Results  Component Value Date   CREATININE 0.52 05/28/2020   BUN 11 05/28/2020   NA 139 05/28/2020   K 4.4 05/28/2020   CL 102 05/28/2020   CO2 29 05/28/2020   Lab Results  Component Value Date   ALT 23 05/28/2020   AST 15 05/28/2020   ALKPHOS 72 11/08/2015  BILITOT 0.5 05/28/2020   Lab Results  Component Value Date   HGBA1C 5.2 05/28/2020   HGBA1C 5.1 11/28/2019   HGBA1C 5.2 09/20/2014   HGBA1C 5.3 10/07/2010   Lab Results  Component Value Date   INSULIN 6.9 06/18/2020   Lab Results  Component Value Date   TSH 1.25 05/28/2020   Lab Results  Component Value Date   CHOL 239  (H) 05/28/2020   HDL 56 05/28/2020   LDLCALC 155 (H) 05/28/2020   TRIG 151 (H) 05/28/2020   CHOLHDL 4.3 05/28/2020   Lab Results  Component Value Date   WBC 6.7 05/28/2020   HGB 14.1 05/28/2020   HCT 41.5 05/28/2020   MCV 83.0 05/28/2020   PLT 306 05/28/2020   Lab Results  Component Value Date   IRON 47 01/15/2017   TIBC 448 01/15/2017   FERRITIN 122 06/18/2020     Attestation Statements:   Reviewed by clinician on day of visit: allergies, medications, problem list, medical history, surgical history, family history, social history, and previous encounter notes.  Time spent on visit including pre-visit chart review and post-visit care and charting was 30 minutes.   Coral Ceo, CMA, am acting as transcriptionist for Mina Marble, NP.  I have reviewed the above documentation for accuracy and completeness, and I agree with the above. -  Mahati Vajda d. Hebe Merriwether, NP-C

## 2020-08-30 ENCOUNTER — Telehealth: Payer: Self-pay

## 2020-08-30 ENCOUNTER — Ambulatory Visit (INDEPENDENT_AMBULATORY_CARE_PROVIDER_SITE_OTHER): Payer: No Typology Code available for payment source | Admitting: Orthopedic Surgery

## 2020-08-30 DIAGNOSIS — M1711 Unilateral primary osteoarthritis, right knee: Secondary | ICD-10-CM

## 2020-08-30 DIAGNOSIS — M1712 Unilateral primary osteoarthritis, left knee: Secondary | ICD-10-CM | POA: Diagnosis not present

## 2020-08-30 NOTE — Telephone Encounter (Signed)
Repeat durolane bilat in 6 months

## 2020-08-31 ENCOUNTER — Encounter: Payer: Self-pay | Admitting: Orthopedic Surgery

## 2020-08-31 DIAGNOSIS — M1712 Unilateral primary osteoarthritis, left knee: Secondary | ICD-10-CM

## 2020-08-31 DIAGNOSIS — M1711 Unilateral primary osteoarthritis, right knee: Secondary | ICD-10-CM

## 2020-08-31 MED ORDER — SODIUM HYALURONATE 60 MG/3ML IX PRSY
60.0000 mg | PREFILLED_SYRINGE | INTRA_ARTICULAR | Status: AC | PRN
  Administered 2020-08-31: 60 mg via INTRA_ARTICULAR

## 2020-08-31 MED ORDER — LIDOCAINE HCL 1 % IJ SOLN
5.0000 mL | INTRAMUSCULAR | Status: AC | PRN
  Administered 2020-08-31: 5 mL

## 2020-08-31 NOTE — Progress Notes (Signed)
   Procedure Note  Patient: Tricia Miranda             Date of Birth: [birth year]           MRN: [id number]             Visit Date: 08/30/2020  Procedures: Visit Diagnoses:  1. Unilateral primary osteoarthritis, left knee   2. Unilateral primary osteoarthritis, right knee     Large Joint Inj: bilateral knee on 08/31/2020 8:25 AM Indications: diagnostic evaluation, joint swelling and pain Details: 18 G 1.5 in needle, superolateral approach  Arthrogram: No  Medications (Right): 5 mL lidocaine 1 %; 60 mg Sodium Hyaluronate 60 MG/3ML Medications (Left): 5 mL lidocaine 1 %; 60 mg Sodium Hyaluronate 60 MG/3ML Outcome: tolerated well, no immediate complications Procedure, treatment alternatives, risks and benefits explained, specific risks discussed. Consent was given by the patient. Immediately prior to procedure a time out was called to verify the correct patient, procedure, equipment, support staff and site/side marked as required. Patient was prepped and draped in the usual sterile fashion.

## 2020-09-03 ENCOUNTER — Other Ambulatory Visit: Payer: Self-pay | Admitting: Internal Medicine

## 2020-09-03 NOTE — Telephone Encounter (Signed)
Noted. Will submit in October, 2022

## 2020-09-06 ENCOUNTER — Other Ambulatory Visit: Payer: Self-pay | Admitting: Internal Medicine

## 2020-09-11 ENCOUNTER — Ambulatory Visit (INDEPENDENT_AMBULATORY_CARE_PROVIDER_SITE_OTHER): Payer: No Typology Code available for payment source | Admitting: Bariatrics

## 2020-09-11 ENCOUNTER — Other Ambulatory Visit: Payer: Self-pay

## 2020-09-11 ENCOUNTER — Other Ambulatory Visit: Payer: Self-pay | Admitting: Obstetrics & Gynecology

## 2020-09-11 ENCOUNTER — Encounter (INDEPENDENT_AMBULATORY_CARE_PROVIDER_SITE_OTHER): Payer: Self-pay | Admitting: Bariatrics

## 2020-09-11 VITALS — BP 130/80 | HR 65 | Temp 98.2°F | Ht 67.0 in | Wt 217.0 lb

## 2020-09-11 DIAGNOSIS — E7800 Pure hypercholesterolemia, unspecified: Secondary | ICD-10-CM

## 2020-09-11 DIAGNOSIS — E038 Other specified hypothyroidism: Secondary | ICD-10-CM | POA: Diagnosis not present

## 2020-09-11 DIAGNOSIS — Z6835 Body mass index (BMI) 35.0-35.9, adult: Secondary | ICD-10-CM

## 2020-09-11 DIAGNOSIS — Z1231 Encounter for screening mammogram for malignant neoplasm of breast: Secondary | ICD-10-CM

## 2020-09-17 ENCOUNTER — Encounter (INDEPENDENT_AMBULATORY_CARE_PROVIDER_SITE_OTHER): Payer: Self-pay | Admitting: Bariatrics

## 2020-09-17 NOTE — Progress Notes (Signed)
Chief Complaint:   OBESITY Tricia Miranda is here to discuss her progress with her obesity treatment plan along with follow-up of her obesity related diagnoses. Tricia Miranda is on the Category 3 Plan or keeping a food journal and adhering to recommended goals of 1400 calories and 80-90 grams of protein daily and states she is following her eating plan approximately 95% of the time. Tricia Miranda states she is walking for 45-60 minutes 3 times per week.  Today's visit was #: 6 Starting weight: 224 lbs Starting date: 06/18/2020 Today's weight: 217 lbs Today's date: 09/11/2020 Total lbs lost to date: 7 Total lbs lost since last in-office visit: 2  Interim History: Tricia Miranda is down an additional 2 lbs since her last visit. She denies any struggles over the last few weeks.  Subjective:   1. Other specified hypothyroidism Tricia Miranda is taking Synthroid currently.  2. Elevated cholesterol Tricia Miranda is not on medications currently.  Assessment/Plan:   1. Other specified hypothyroidism Tricia Miranda will continue Synthroid. Orders and follow up as documented in patient record.  Counseling . Good thyroid control is important for overall health. Supratherapeutic thyroid levels are dangerous and will not improve weight loss results. . Counseling: The correct way to take levothyroxine is fasting, with water, separated by at least 30 minutes from breakfast, and separated by more than 4 hours from calcium, iron, multivitamins, acid reflux medications (PPIs).   2. Elevated cholesterol Cardiovascular risk and specific lipid/LDL goals reviewed. We discussed several lifestyle modifications today. Tricia Miranda will continue to work on diet, exercise and weight loss efforts. No trans fats, increasing PUFA's and MUFA's. Orders and follow up as documented in patient record.   Counseling Intensive lifestyle modifications are the first line treatment for this issue. . Dietary changes: Increase soluble fiber. Decrease simple  carbohydrates. . Exercise changes: Moderate to vigorous-intensity aerobic activity 150 minutes per week if tolerated. . Lipid-lowering medications: see documented in medical record.  3. Obesity, current BMI 34 Tricia Miranda is currently in the action stage of change. As such, her goal is to continue with weight loss efforts. She has agreed to the Category 3 Plan.   Mindful eating was discussed.  Exercise goals: As is.  Behavioral modification strategies: increasing lean protein intake, decreasing simple carbohydrates, increasing vegetables, increasing water intake, decreasing eating out, no skipping meals, meal planning and cooking strategies, keeping healthy foods in the home and planning for success.  Tricia Miranda has agreed to follow-up with our clinic in 2 weeks. She was informed of the importance of frequent follow-up visits to maximize her success with intensive lifestyle modifications for her multiple health conditions.   Objective:   Blood pressure 130/80, pulse 65, temperature 98.2 F (36.8 C), height 5\' 7"  (1.702 m), weight 217 lb (98.4 kg), last menstrual period 11/15/2018. Body mass index is 33.99 kg/m.  General: Cooperative, alert, well developed, in no acute distress. HEENT: Conjunctivae and lids unremarkable. Cardiovascular: Regular rhythm.  Lungs: Normal work of breathing. Neurologic: No focal deficits.   Lab Results  Component Value Date   CREATININE 0.52 05/28/2020   BUN 11 05/28/2020   NA 139 05/28/2020   K 4.4 05/28/2020   CL 102 05/28/2020   CO2 29 05/28/2020   Lab Results  Component Value Date   ALT 23 05/28/2020   AST 15 05/28/2020   ALKPHOS 72 11/08/2015   BILITOT 0.5 05/28/2020   Lab Results  Component Value Date   HGBA1C 5.2 05/28/2020   HGBA1C 5.1 11/28/2019   HGBA1C 5.2 09/20/2014  HGBA1C 5.3 10/07/2010   Lab Results  Component Value Date   INSULIN 6.9 06/18/2020   Lab Results  Component Value Date   TSH 1.25 05/28/2020   Lab Results   Component Value Date   CHOL 239 (H) 05/28/2020   HDL 56 05/28/2020   LDLCALC 155 (H) 05/28/2020   TRIG 151 (H) 05/28/2020   CHOLHDL 4.3 05/28/2020   Lab Results  Component Value Date   WBC 6.7 05/28/2020   HGB 14.1 05/28/2020   HCT 41.5 05/28/2020   MCV 83.0 05/28/2020   PLT 306 05/28/2020   Lab Results  Component Value Date   IRON 47 01/15/2017   TIBC 448 01/15/2017   FERRITIN 122 06/18/2020   Attestation Statements:   Reviewed by clinician on day of visit: allergies, medications, problem list, medical history, surgical history, family history, social history, and previous encounter notes.  Time spent on visit including pre-visit chart review and post-visit care and charting was 20 minutes.    Wilhemena Durie, am acting as Location manager for [employer], DO.  I have reviewed the above documentation for accuracy and completeness, and I agree with the above. Jearld Lesch, DO

## 2020-10-03 ENCOUNTER — Ambulatory Visit (INDEPENDENT_AMBULATORY_CARE_PROVIDER_SITE_OTHER): Payer: No Typology Code available for payment source | Admitting: Bariatrics

## 2020-10-22 ENCOUNTER — Other Ambulatory Visit: Payer: Self-pay | Admitting: Internal Medicine

## 2020-10-23 ENCOUNTER — Other Ambulatory Visit: Payer: Self-pay

## 2020-10-23 ENCOUNTER — Ambulatory Visit (INDEPENDENT_AMBULATORY_CARE_PROVIDER_SITE_OTHER): Payer: No Typology Code available for payment source | Admitting: Bariatrics

## 2020-10-23 ENCOUNTER — Encounter (INDEPENDENT_AMBULATORY_CARE_PROVIDER_SITE_OTHER): Payer: Self-pay | Admitting: Bariatrics

## 2020-10-23 VITALS — BP 128/85 | HR 67 | Temp 98.1°F | Ht 67.0 in | Wt 212.0 lb

## 2020-10-23 DIAGNOSIS — Z6835 Body mass index (BMI) 35.0-35.9, adult: Secondary | ICD-10-CM

## 2020-10-23 DIAGNOSIS — E038 Other specified hypothyroidism: Secondary | ICD-10-CM | POA: Diagnosis not present

## 2020-10-23 DIAGNOSIS — E7800 Pure hypercholesterolemia, unspecified: Secondary | ICD-10-CM | POA: Diagnosis not present

## 2020-10-28 ENCOUNTER — Encounter (INDEPENDENT_AMBULATORY_CARE_PROVIDER_SITE_OTHER): Payer: Self-pay | Admitting: Bariatrics

## 2020-10-28 NOTE — Progress Notes (Signed)
Chief Complaint:   OBESITY Tricia Miranda is here to discuss her progress with her obesity treatment plan along with follow-up of her obesity related diagnoses. Tricia Miranda is on the Category 3 Plan and states she is following her eating plan approximately 60% of the time. Tricia Miranda states she is walking and biking 30-90 minutes 3-4 times per week.  Today's visit was #: 7 Starting weight: 224 lbs Starting date: 06/18/2020 Today's weight: 212 lbs Today's date: 10/23/2020 Total lbs lost to date: 12 lbs Total lbs lost since last in-office visit: 5  Interim History: Tricia Miranda has been down 5 lbs since her last visit.  She had been on vacation but walking more. She is doing well with her water.  Subjective:   1. Elevated cholesterol Tricia Miranda is not on any medications.  2. Other specified hypothyroidism Tricia Miranda is taking Synthroid.  Assessment/Plan:   1. Elevated cholesterol Cardiovascular risk and specific lipid/LDL goals reviewed.  We discussed several lifestyle modifications today and Tricia Miranda will continue to work on diet, exercise and weight loss efforts. Zero trans fats and limited saturated fats.   Counseling Intensive lifestyle modifications are the first line treatment for this issue. Dietary changes: Increase soluble fiber. Decrease simple carbohydrates. Exercise changes: Moderate to vigorous-intensity aerobic activity 150 minutes per week if tolerated. Lipid-lowering medications: see documented in medical record.   2. Other specified hypothyroidism Tricia Miranda will continue taking Synthroid. Orders and follow up as documented in patient record.  Counseling Good thyroid control is important for overall health. Supratherapeutic thyroid levels are dangerous and will not improve weight loss results. Counseling: The correct way to take levothyroxine is fasting, with water, separated by at least 30 minutes from breakfast, and separated by more than 4 hours from calcium, iron, multivitamins, acid reflux  medications (PPIs).    3. Obesity, current BMI 33.3 Tricia Miranda is currently in the action stage of change. As such, her goal is to continue with weight loss efforts. She has agreed to the Category 3 Plan.   Tricia Miranda will continue meal planning, and intentional eating.  Exercise goals:  As is.  Behavioral modification strategies: increasing lean protein intake, decreasing simple carbohydrates, increasing vegetables, increasing water intake, decreasing eating out, no skipping meals, meal planning and cooking strategies, keeping healthy foods in the home, and planning for success.  Tricia Miranda has agreed to follow-up with our clinic in 2-3 weeks(fasting). She was informed of the importance of frequent follow-up visits to maximize her success with intensive lifestyle modifications for her multiple health conditions.   Objective:   Blood pressure 128/85, pulse 67, temperature 98.1 F (36.7 C), height 5\' 7"  (1.702 m), weight 212 lb (96.2 kg), last menstrual period 11/15/2018, SpO2 98 %. Body mass index is 33.2 kg/m.  General: Cooperative, alert, well developed, in no acute distress. HEENT: Conjunctivae and lids unremarkable. Cardiovascular: Regular rhythm.  Lungs: Normal work of breathing. Neurologic: No focal deficits.   Lab Results  Component Value Date   CREATININE 0.52 05/28/2020   BUN 11 05/28/2020   NA 139 05/28/2020   K 4.4 05/28/2020   CL 102 05/28/2020   CO2 29 05/28/2020   Lab Results  Component Value Date   ALT 23 05/28/2020   AST 15 05/28/2020   ALKPHOS 72 11/08/2015   BILITOT 0.5 05/28/2020   Lab Results  Component Value Date   HGBA1C 5.2 05/28/2020   HGBA1C 5.1 11/28/2019   HGBA1C 5.2 09/20/2014   HGBA1C 5.3 10/07/2010   Lab Results  Component Value Date  INSULIN 6.9 06/18/2020   Lab Results  Component Value Date   TSH 1.25 05/28/2020   Lab Results  Component Value Date   CHOL 239 (H) 05/28/2020   HDL 56 05/28/2020   LDLCALC 155 (H) 05/28/2020   TRIG 151 (H)  05/28/2020   CHOLHDL 4.3 05/28/2020   Lab Results  Component Value Date   VD25OH 43 05/28/2020   VD25OH 29 (L) 05/02/2019   VD25OH 32 04/28/2018   Lab Results  Component Value Date   WBC 6.7 05/28/2020   HGB 14.1 05/28/2020   HCT 41.5 05/28/2020   MCV 83.0 05/28/2020   PLT 306 05/28/2020   Lab Results  Component Value Date   IRON 47 01/15/2017   TIBC 448 01/15/2017   FERRITIN 122 06/18/2020     Attestation Statements:   Reviewed by clinician on day of visit: allergies, medications, problem list, medical history, surgical history, family history, social history, and previous encounter notes.  Time spent on visit including pre-visit chart review and post-visit care and charting was 20 minutes.   I, Lizbeth Bark, RMA, am acting as Location manager for [employer], DO.   I have reviewed the above documentation for accuracy and completeness, and I agree with the above. Jearld Lesch, DO

## 2020-11-06 ENCOUNTER — Ambulatory Visit (INDEPENDENT_AMBULATORY_CARE_PROVIDER_SITE_OTHER): Payer: No Typology Code available for payment source | Admitting: Bariatrics

## 2020-11-06 ENCOUNTER — Other Ambulatory Visit: Payer: Self-pay

## 2020-11-06 ENCOUNTER — Encounter (INDEPENDENT_AMBULATORY_CARE_PROVIDER_SITE_OTHER): Payer: Self-pay | Admitting: Bariatrics

## 2020-11-06 VITALS — BP 128/82 | HR 69 | Temp 97.9°F | Ht 67.0 in | Wt 213.0 lb

## 2020-11-06 DIAGNOSIS — E559 Vitamin D deficiency, unspecified: Secondary | ICD-10-CM | POA: Diagnosis not present

## 2020-11-06 DIAGNOSIS — E7800 Pure hypercholesterolemia, unspecified: Secondary | ICD-10-CM | POA: Diagnosis not present

## 2020-11-06 DIAGNOSIS — Z9189 Other specified personal risk factors, not elsewhere classified: Secondary | ICD-10-CM | POA: Diagnosis not present

## 2020-11-06 DIAGNOSIS — Z6835 Body mass index (BMI) 35.0-35.9, adult: Secondary | ICD-10-CM

## 2020-11-06 DIAGNOSIS — E038 Other specified hypothyroidism: Secondary | ICD-10-CM | POA: Diagnosis not present

## 2020-11-06 MED ORDER — VITAMIN D (ERGOCALCIFEROL) 1.25 MG (50000 UNIT) PO CAPS: 50000.0000 [IU] | ORAL_CAPSULE | ORAL | 0 refills | Status: DC

## 2020-11-07 LAB — COMPREHENSIVE METABOLIC PANEL
ALT: 17 IU/L (ref 0–32)
AST: 15 IU/L (ref 0–40)
Albumin/Globulin Ratio: 2 (ref 1.2–2.2)
Albumin: 4.5 g/dL (ref 3.8–4.9)
Alkaline Phosphatase: 113 IU/L (ref 44–121)
BUN/Creatinine Ratio: 23 (ref 9–23)
BUN: 14 mg/dL (ref 6–24)
Bilirubin Total: 0.4 mg/dL (ref 0.0–1.2)
CO2: 24 mmol/L (ref 20–29)
Calcium: 9.6 mg/dL (ref 8.7–10.2)
Chloride: 101 mmol/L (ref 96–106)
Creatinine, Ser: 0.61 mg/dL (ref 0.57–1.00)
Globulin, Total: 2.2 g/dL (ref 1.5–4.5)
Glucose: 87 mg/dL (ref 65–99)
Potassium: 4.5 mmol/L (ref 3.5–5.2)
Sodium: 140 mmol/L (ref 134–144)
Total Protein: 6.7 g/dL (ref 6.0–8.5)
eGFR: 106 mL/min/{1.73_m2} (ref >59)

## 2020-11-07 LAB — LIPID PANEL WITH LDL/HDL RATIO
Cholesterol, Total: 251 mg/dL — ABNORMAL HIGH (ref 100–199)
HDL: 58 mg/dL (ref >39)
LDL Chol Calc (NIH): 171 mg/dL — ABNORMAL HIGH (ref 0–99)
LDL/HDL Ratio: 2.9 ratio (ref 0.0–3.2)
Triglycerides: 122 mg/dL (ref 0–149)
VLDL Cholesterol Cal: 22 mg/dL (ref 5–40)

## 2020-11-07 LAB — TSH+T4F+T3FREE
Free T4: 1.48 ng/dL (ref 0.82–1.77)
T3, Free: 2.2 pg/mL (ref 2.0–4.4)
TSH: 0.611 u[IU]/mL (ref 0.450–4.500)

## 2020-11-07 LAB — VITAMIN D 25 HYDROXY (VIT D DEFICIENCY, FRACTURES): Vit D, 25-Hydroxy: 40.1 ng/mL (ref 30.0–100.0)

## 2020-11-08 ENCOUNTER — Other Ambulatory Visit: Payer: Self-pay

## 2020-11-08 ENCOUNTER — Ambulatory Visit
Admission: RE | Admit: 2020-11-08 | Discharge: 2020-11-08 | Disposition: A | Discharge status: Home / Self Care | Payer: No Typology Code available for payment source | Source: Ambulatory Visit | Attending: Obstetrics & Gynecology | Admitting: Obstetrics & Gynecology

## 2020-11-08 DIAGNOSIS — Z1231 Encounter for screening mammogram for malignant neoplasm of breast: Secondary | ICD-10-CM

## 2020-11-12 ENCOUNTER — Encounter (INDEPENDENT_AMBULATORY_CARE_PROVIDER_SITE_OTHER): Payer: Self-pay | Admitting: Bariatrics

## 2020-11-12 NOTE — Progress Notes (Signed)
Chief Complaint:   OBESITY Tricia Miranda is here to discuss her progress with her obesity treatment plan along with follow-up of her obesity related diagnoses. Tricia Miranda is on the Category 3 Plan and states she is following her eating plan approximately 80% of the time. Tricia Miranda states she is swimming and walking 45 minutes 5 times per week.  Today's visit was #: 8 Starting weight: 224 lbs Starting date: 06/18/2020 Today's weight: 213 lbs Today's date: 11/06/2020 Total lbs lost to date: 11 Total lbs lost since last in-office visit: 0  Interim History: Tricia Miranda is up 1 lb since her last visit. Her carbohydrates have been up.  Subjective:   1. Vitamin D deficiency She is currently taking prescription vitamin D 50,000 IU each week. She denies nausea, vomiting or muscle weakness.  Lab Results  Component Value Date   VD25OH 40.1 11/06/2020   VD25OH 43 05/28/2020   VD25OH 29 (L) 05/02/2019   2. Other specified hypothyroidism [name] is taking Synthroid.  3. Elevated cholesterol Tricia Miranda has hyperlipidemia and has been trying to improve her cholesterol levels with intensive lifestyle modification including a low saturated fat diet, exercise and weight loss. She denies any chest pain, claudication or myalgias.  Lab Results  Component Value Date   ALT 17 11/06/2020   AST 15 11/06/2020   ALKPHOS 113 11/06/2020   BILITOT 0.4 11/06/2020   Lab Results  Component Value Date   CHOL 251 (H) 11/06/2020   HDL 58 11/06/2020   LDLCALC 171 (H) 11/06/2020   TRIG 122 11/06/2020   CHOLHDL 4.3 05/28/2020   4. At risk for dehydration Tricia Miranda is at risk for dehydration due to exercise, obesity, and weather.  Assessment/Plan:   1. Vitamin D deficiency Low Vitamin D level contributes to fatigue and are associated with obesity, breast, and colon cancer. She agrees to continue to take prescription Vitamin D '@50'$ ,000 IU every week and will follow-up for routine testing of Vitamin D, at least 2-3 times per year to  avoid over-replacement. Check labs today.  Refill- Vitamin D, Ergocalciferol, (DRISDOL) 1.25 MG (50000 UNIT) CAPS capsule; Take 1 capsule (50,000 Units total) by mouth once a week.  Dispense: 12 capsule; Refill: 0  - VITAMIN D 25 Hydroxy (Vit-D Deficiency, Fractures)  2. Other specified hypothyroidism Patient with long-standing hypothyroidism, on levothyroxine therapy. She appears euthyroid. Orders and follow up as documented in patient record.  Counseling Good thyroid control is important for overall health. Supratherapeutic thyroid levels are dangerous and will not improve weight loss results. The correct way to take levothyroxine is fasting, with water, separated by at least 30 minutes from breakfast, and separated by more than 4 hours from calcium, iron, multivitamins, acid reflux medications (PPIs).  Check labs today.  - TSH+T4F+T3Free  3. Elevated cholesterol Cardiovascular risk and specific lipid/LDL goals reviewed.  We discussed several lifestyle modifications today and Shaquilla will continue to work on diet, exercise and weight loss efforts. Orders and follow up as documented in patient record.   Counseling Intensive lifestyle modifications are the first line treatment for this issue. Dietary changes: Increase soluble fiber. Decrease simple carbohydrates. Exercise changes: Moderate to vigorous-intensity aerobic activity 150 minutes per week if tolerated. Lipid-lowering medications: see documented in medical record. Check labs today.  - Comprehensive metabolic panel - Lipid Panel With LDL/HDL Ratio - Lipid Cascade  4. At risk for dehydration Tricia Miranda was given approximately 15 minutes dehydration prevention counseling today. Tricia Miranda is at risk for dehydration due to weight loss and current  medication(s). She was encouraged to hydrate and monitor fluid status to avoid dehydration as well as weight loss plateaus.    5. Obesity, current BMI 33.4  Schronda is currently in the action  stage of change. As such, her goal is to continue with weight loss efforts. She has agreed to the Category 3 Plan.   Meal planning  Exercise goals:  As is  Behavioral modification strategies: increasing lean protein intake, decreasing simple carbohydrates, increasing vegetables, increasing water intake, decreasing eating out, no skipping meals, meal planning and cooking strategies, keeping healthy foods in the home, and planning for success.  [name] has agreed to follow-up with our clinic in 2-3 weeks. She was informed of the importance of frequent follow-up visits to maximize her success with intensive lifestyle modifications for her multiple health conditions.   Objective:   Blood pressure 128/82, pulse 69, temperature 97.9 F (36.6 C), height '5\' 7"'$  (1.702 m), weight 213 lb (96.6 kg), last menstrual period 11/15/2018, SpO2 97 %. Body mass index is 33.36 kg/m.  General: Cooperative, alert, well developed, in no acute distress. HEENT: Conjunctivae and lids unremarkable. Cardiovascular: Regular rhythm.  Lungs: Normal work of breathing. Neurologic: No focal deficits.   Lab Results  Component Value Date   CREATININE 0.61 11/06/2020   BUN 14 11/06/2020   NA 140 11/06/2020   K 4.5 11/06/2020   CL 101 11/06/2020   CO2 24 11/06/2020   Lab Results  Component Value Date   ALT 17 11/06/2020   AST 15 11/06/2020   ALKPHOS 113 11/06/2020   BILITOT 0.4 11/06/2020   Lab Results  Component Value Date   HGBA1C 5.2 05/28/2020   HGBA1C 5.1 11/28/2019   HGBA1C 5.2 09/20/2014   HGBA1C 5.3 10/07/2010   Lab Results  Component Value Date   INSULIN 6.9 06/18/2020   Lab Results  Component Value Date   TSH 0.611 11/06/2020   Lab Results  Component Value Date   CHOL 251 (H) 11/06/2020   HDL 58 11/06/2020   LDLCALC 171 (H) 11/06/2020   TRIG 122 11/06/2020   CHOLHDL 4.3 05/28/2020   Lab Results  Component Value Date   VD25OH 40.1 11/06/2020   VD25OH 43 05/28/2020   VD25OH 29 (L)  05/02/2019   Lab Results  Component Value Date   WBC 6.7 05/28/2020   HGB 14.1 05/28/2020   HCT 41.5 05/28/2020   MCV 83.0 05/28/2020   PLT 306 05/28/2020   Lab Results  Component Value Date   IRON 47 01/15/2017   TIBC 448 01/15/2017   FERRITIN 122 06/18/2020    Attestation Statements:   Reviewed by clinician on day of visit: allergies, medications, problem list, medical history, surgical history, family history, social history, and previous encounter notes.  Coral Ceo, CMA, am acting as Location manager for [employer], DO.  I have reviewed the above documentation for accuracy and completeness, and I agree with the above. Jearld Lesch, DO

## 2020-11-13 ENCOUNTER — Other Ambulatory Visit: Payer: Self-pay | Admitting: Obstetrics & Gynecology

## 2020-11-13 DIAGNOSIS — R928 Other abnormal and inconclusive findings on diagnostic imaging of breast: Secondary | ICD-10-CM

## 2020-11-21 ENCOUNTER — Other Ambulatory Visit: Payer: Self-pay

## 2020-11-21 ENCOUNTER — Ambulatory Visit (INDEPENDENT_AMBULATORY_CARE_PROVIDER_SITE_OTHER): Payer: No Typology Code available for payment source | Admitting: Bariatrics

## 2020-11-21 VITALS — BP 121/79 | HR 65 | Temp 98.1°F | Ht 67.0 in | Wt 214.0 lb

## 2020-11-21 DIAGNOSIS — E038 Other specified hypothyroidism: Secondary | ICD-10-CM | POA: Diagnosis not present

## 2020-11-21 DIAGNOSIS — Z9189 Other specified personal risk factors, not elsewhere classified: Secondary | ICD-10-CM | POA: Diagnosis not present

## 2020-11-21 DIAGNOSIS — Z6835 Body mass index (BMI) 35.0-35.9, adult: Secondary | ICD-10-CM

## 2020-11-21 DIAGNOSIS — E7800 Pure hypercholesterolemia, unspecified: Secondary | ICD-10-CM | POA: Diagnosis not present

## 2020-11-24 NOTE — Progress Notes (Signed)
Chief Complaint:   OBESITY Tricia Miranda is here to discuss her progress with her obesity treatment plan along with follow-up of her obesity related diagnoses. Tricia Miranda is on the Category 3 Plan and states she is following her eating plan approximately 78-80% of the time. Tricia Miranda states she is swimming and walking 50 minutes 3 times per week.  Today's visit was #: 9 Starting weight: 224 lbs Starting date: 06/18/2020 Today's weight: 214 lbs Today's date: 11/21/2020 Total lbs lost to date: 10 lbs Total lbs lost since last in-office visit: 1 lb  Interim History: Tricia Miranda is up 1 lb since her last visit. She has been visiting family and not adhering to the plan. Her protein, water, and fruit intake are good.  Subjective:   1. Elevated cholesterol Tricia Miranda has hyperlipidemia and has been trying to improve her cholesterol levels with intensive lifestyle modification including a low saturated fat diet, exercise and weight loss. She denies any chest pain, claudication or myalgias.She is not currently taking any medications.  2. Other specified hypothyroidism Tricia Miranda is currently taking Synthroid.  Lab Results  Component Value Date   TSH 0.611 11/06/2020     3. At risk for activity intolerance Tricia Miranda is at risk of exercise intolerance due to obesity.   Assessment/Plan:   1. Elevated cholesterol Cardiovascular risk and specific lipid/LDL goals reviewed.  We discussed several lifestyle modifications today and Temari will continue to work on diet, exercise and weight loss efforts. Orders and follow up as documented in patient record.  Tricia Miranda will work on no trans fats and increase Omega 3 to Omega 6. Counseling Intensive lifestyle modifications are the first line treatment for this issue. Dietary changes: Increase soluble fiber. Decrease simple carbohydrates. Exercise changes: Moderate to vigorous-intensity aerobic activity 150 minutes per week if tolerated. Lipid-lowering medications: see documented in  medical record.  2. Other specified hypothyroidism Patient with long-standing hypothyroidism, on levothyroxine therapy. She appears euthyroid. Orders and follow up as documented in patient record. Tricia Miranda will continue taking Synthroid.  Counseling Good thyroid control is important for overall health. Supratherapeutic thyroid levels are dangerous and will not improve weight loss results. The correct way to take levothyroxine is fasting, with water, separated by at least 30 minutes from breakfast, and separated by more than 4 hours from calcium, iron, multivitamins, acid reflux medications (PPIs).    3. At risk for activity intolerance Tricia Miranda was given approximately 15 minutes of exercise intolerance counseling today. She is 55 y.o. female and has risk factors exercise intolerance including obesity. We discussed intensive lifestyle modifications today with an emphasis on specific weight loss instructions and strategies. Tricia Miranda will slowly increase activity as tolerated.  Repetitive spaced learning was employed today to elicit superior memory formation and behavioral change.   4. Obesity, current BMI 33.6 Tricia Miranda is currently in the action stage of change. As such, her goal is to continue with weight loss efforts. She has agreed to the Category 3 Plan.   Meal plan Intentional eating Review labs from 11/06/20 (CMP, Lipid panel, vitamin D, glucose and thyroid panel ).   Exercise goals: Swimming and walking  Behavioral modification strategies: increasing lean protein intake, decreasing simple carbohydrates, increasing vegetables, increasing water intake, decreasing eating out, no skipping meals, meal planning and cooking strategies, keeping healthy foods in the home, and planning for success.  Tricia Miranda has agreed to follow-up with our clinic in 3 weeks. She was informed of the importance of frequent follow-up visits to maximize her success with intensive lifestyle  modifications for her multiple health  conditions.   Objective:   Blood pressure 121/79, pulse 65, temperature 98.1 F (36.7 C), height '5\' 7"'$  (1.702 m), weight 214 lb (97.1 kg), last menstrual period 11/15/2018, SpO2 98 %. Body mass index is 33.52 kg/m.  General: Cooperative, alert, well developed, in no acute distress. HEENT: Conjunctivae and lids unremarkable. Cardiovascular: Regular rhythm.  Lungs: Normal work of breathing. Neurologic: No focal deficits.   Lab Results  Component Value Date   CREATININE 0.61 11/06/2020   BUN 14 11/06/2020   NA 140 11/06/2020   K 4.5 11/06/2020   CL 101 11/06/2020   CO2 24 11/06/2020   Lab Results  Component Value Date   ALT 17 11/06/2020   AST 15 11/06/2020   ALKPHOS 113 11/06/2020   BILITOT 0.4 11/06/2020   Lab Results  Component Value Date   HGBA1C 5.2 05/28/2020   HGBA1C 5.1 11/28/2019   HGBA1C 5.2 09/20/2014   HGBA1C 5.3 10/07/2010   Lab Results  Component Value Date   INSULIN 6.9 06/18/2020   Lab Results  Component Value Date   TSH 0.611 11/06/2020   Lab Results  Component Value Date   CHOL 251 (H) 11/06/2020   HDL 58 11/06/2020   LDLCALC 171 (H) 11/06/2020   TRIG 122 11/06/2020   CHOLHDL 4.3 05/28/2020   Lab Results  Component Value Date   VD25OH 40.1 11/06/2020   VD25OH 43 05/28/2020   VD25OH 29 (L) 05/02/2019   Lab Results  Component Value Date   WBC 6.7 05/28/2020   HGB 14.1 05/28/2020   HCT 41.5 05/28/2020   MCV 83.0 05/28/2020   PLT 306 05/28/2020   Lab Results  Component Value Date   IRON 47 01/15/2017   TIBC 448 01/15/2017   FERRITIN 122 06/18/2020   Attestation Statements:   Reviewed by clinician on day of visit: allergies, medications, problem list, medical history, surgical history, family history, social history, and previous encounter notes.  ILennette Bihari, CMA, am acting as transcriptionist for Dr. Jearld Lesch, DO.  I have reviewed the above documentation for accuracy and completeness, and I agree with the above. Jearld Lesch, DO

## 2020-11-25 ENCOUNTER — Encounter (INDEPENDENT_AMBULATORY_CARE_PROVIDER_SITE_OTHER): Payer: Self-pay | Admitting: Bariatrics

## 2020-12-04 ENCOUNTER — Ambulatory Visit
Admission: RE | Admit: 2020-12-04 | Discharge: 2020-12-04 | Disposition: A | Discharge status: Home / Self Care | Payer: No Typology Code available for payment source | Source: Ambulatory Visit | Attending: Obstetrics & Gynecology | Admitting: Obstetrics & Gynecology

## 2020-12-04 ENCOUNTER — Other Ambulatory Visit: Payer: Self-pay

## 2020-12-04 ENCOUNTER — Ambulatory Visit: Payer: No Typology Code available for payment source

## 2020-12-04 DIAGNOSIS — R928 Other abnormal and inconclusive findings on diagnostic imaging of breast: Secondary | ICD-10-CM

## 2020-12-11 ENCOUNTER — Encounter (INDEPENDENT_AMBULATORY_CARE_PROVIDER_SITE_OTHER): Payer: Self-pay | Admitting: Family Medicine

## 2020-12-11 ENCOUNTER — Other Ambulatory Visit: Payer: Self-pay

## 2020-12-11 ENCOUNTER — Ambulatory Visit (INDEPENDENT_AMBULATORY_CARE_PROVIDER_SITE_OTHER): Payer: No Typology Code available for payment source | Admitting: Family Medicine

## 2020-12-11 VITALS — BP 123/84 | HR 74 | Temp 98.4°F | Ht 67.0 in | Wt 212.0 lb

## 2020-12-11 DIAGNOSIS — E559 Vitamin D deficiency, unspecified: Secondary | ICD-10-CM | POA: Diagnosis not present

## 2020-12-11 DIAGNOSIS — E66812 Obesity, class 2: Secondary | ICD-10-CM

## 2020-12-11 DIAGNOSIS — Z6835 Body mass index (BMI) 35.0-35.9, adult: Secondary | ICD-10-CM

## 2020-12-12 ENCOUNTER — Encounter: Payer: Self-pay | Admitting: Internal Medicine

## 2020-12-12 ENCOUNTER — Telehealth: Payer: Self-pay | Admitting: Internal Medicine

## 2020-12-12 ENCOUNTER — Ambulatory Visit: Payer: No Typology Code available for payment source | Admitting: Internal Medicine

## 2020-12-12 VITALS — BP 120/80 | HR 72 | Ht 67.0 in | Wt 214.0 lb

## 2020-12-12 DIAGNOSIS — F419 Anxiety disorder, unspecified: Secondary | ICD-10-CM

## 2020-12-12 DIAGNOSIS — Z6833 Body mass index (BMI) 33.0-33.9, adult: Secondary | ICD-10-CM | POA: Diagnosis not present

## 2020-12-12 DIAGNOSIS — E039 Hypothyroidism, unspecified: Secondary | ICD-10-CM | POA: Diagnosis not present

## 2020-12-12 DIAGNOSIS — G4733 Obstructive sleep apnea (adult) (pediatric): Secondary | ICD-10-CM

## 2020-12-12 DIAGNOSIS — E782 Mixed hyperlipidemia: Secondary | ICD-10-CM

## 2020-12-12 NOTE — Progress Notes (Signed)
   Subjective:    Patient ID: Tricia Miranda, female    DOB: [birth year], 55 y.o.   MRN: [id number]  HPI 55 year old Female seen for 6 month follow up.  Recently started going to [facility] weight loss clinic.  She has history of vitamin D deficiency in 2021.  She now takes high-dose vitamin D 50,000 units weekly.  Has lost some 24 pounds.  Thyroid functions are normal on Synthroid 0.112 mg daily.    She has upcoming appointment with Dr. Debara Pickett regarding dyslipidemia and ?  Myalgias due to statin medication.  Previously, she was only taking generic Crestor 5 mg daily.  Her coronary calcium score was 0.  Dr. Debara Pickett and I both feel that she has a genetic component and would do well to be treated with lipid-lowering medication.  She continues to experience some issues with attention deficit.  She and her husband have separated.  Her son is looking at colleges for next year.  Should have Covid booster and flu vaccine this Fall.  History of sleep apnea treated with oral appliance.  History of migraine headaches.  History of anxiety treated with Xanax for many years.  Had hysterectomy BSO July 2020.  Had COVID-08 May 2020  Review of Systems wonders if statin will affect her attention or memory. I have not seen this with low dose statin medication. She can discuss with Dr. Debara Pickett.      Objective:   Physical Exam   No thyromegaly. Chest clear. Cor:RRR no lower extremity edema.  Affect is normal.      Assessment & Plan:  Weight gain-now attending [facility] BMI in February was 35.87 and weight was 229 pounds.  Has lost 15 pounds going to healthy weight clinic.  Weight is now 214 pounds.  Hypothyroidism-stable on current dose of thyroid replacement  Has been placed on high-dose vitamin D per healthy weight clinic  Hyperlipidemia-will be seeing Dr. Debara Pickett soon and can discuss some of her concerns.  She continues to be reluctant to be on statin medication.  Plan:  Her health maintenance exam is due in February 2023.  Plan Xanax 0.25 mg as needed for anxiety.  Continue Prozac 10 mg daily.  Current dose of Synthroid 0.112 mg daily appears to be adequate.

## 2020-12-12 NOTE — Telephone Encounter (Signed)
Patient aware no labs needed - lipid panel from 11/06/20

## 2020-12-12 NOTE — Telephone Encounter (Signed)
Patient called in to see if any blood work needed to be done prior to her appt on 30th

## 2020-12-16 NOTE — Progress Notes (Signed)
Chief Complaint:   OBESITY Tricia Miranda is here to discuss her progress with her obesity treatment plan along with follow-up of her obesity related diagnoses. Tricia Miranda is on the Category 3 Plan and states she is following her eating plan approximately 80% of the time. Tricia Miranda states she is swimming and walking 45-90 minutes 2 times per week.  Today's visit was #: 10 Starting weight: 224 lbs Starting date: 06/18/2020 Today's weight: 212 lbs Today's date: 12/11/2020 Total lbs lost to date: 12 Total lbs lost since last in-office visit: 2  Interim History: Tricia Miranda is a pt of Dr. Saul Fordyce. This is her first visit with me. She is not happy with her weight loss and wishes it was more. She should be able to follow plan more strictly in the coming months. She is not eating all foods on plan.  Assessment/Plan:  No orders of the defined types were placed in this encounter.   There are no discontinued medications.   No orders of the defined types were placed in this encounter.    1. Vitamin D deficiency Not at goal. Tricia Miranda is tolerating medication(s) well without side effects.  Medication compliance is good and patient appears to be taking it as prescribed.  Denies additional concerns regarding this condition.   Plan: Continue to take prescription Vitamin D '@50'$ ,000 IU every week as prescribed.  Follow-up for routine testing of Vitamin D, at least 2-3 times per year to avoid over-replacement.  Lab Results  Component Value Date   VD25OH 40.1 11/06/2020   VD25OH 43 05/28/2020   VD25OH 29 (L) 05/02/2019   2. Obesity with current BMI of 33.2  Tricia Miranda is currently in the action stage of change. As such, her goal is to continue with weight loss efforts. She has agreed to the Category 3 Plan.   Exercise goals: For substantial health benefits, adults should do at least 150 minutes (2 hours and 30 minutes) a week of moderate-intensity, or 75 minutes (1 hour and 15 minutes) a week of  vigorous-intensity aerobic physical activity, or an equivalent combination of moderate- and vigorous-intensity aerobic activity. Aerobic activity should be performed in episodes of at least 10 minutes, and preferably, it should be spread throughout the week.  Behavioral modification strategies: no skipping meals and planning for success.  Tricia Miranda has agreed to follow-up with our clinic in 2-3 weeks with Dr. Owens Shark. She was informed of the importance of frequent follow-up visits to maximize her success with intensive lifestyle modifications for her multiple health conditions.   Objective:   Blood pressure 123/84, pulse 74, temperature 98.4 F (36.9 C), height '5\' 7"'$  (1.702 m), weight 212 lb (96.2 kg), last menstrual period 11/15/2018, SpO2 97 %. Body mass index is 33.2 kg/m.  General: Cooperative, alert, well developed, in no acute distress. HEENT: Conjunctivae and lids unremarkable. Cardiovascular: Regular rhythm.  Lungs: Normal work of breathing. Neurologic: No focal deficits.   Lab Results  Component Value Date   CREATININE 0.61 11/06/2020   BUN 14 11/06/2020   NA 140 11/06/2020   K 4.5 11/06/2020   CL 101 11/06/2020   CO2 24 11/06/2020   Lab Results  Component Value Date   ALT 17 11/06/2020   AST 15 11/06/2020   ALKPHOS 113 11/06/2020   BILITOT 0.4 11/06/2020   Lab Results  Component Value Date   HGBA1C 5.2 05/28/2020   HGBA1C 5.1 11/28/2019   HGBA1C 5.2 09/20/2014   HGBA1C 5.3 10/07/2010   Lab Results  Component  Value Date   INSULIN 6.9 06/18/2020   Lab Results  Component Value Date   TSH 0.611 11/06/2020   Lab Results  Component Value Date   CHOL 251 (H) 11/06/2020   HDL 58 11/06/2020   LDLCALC 171 (H) 11/06/2020   TRIG 122 11/06/2020   CHOLHDL 4.3 05/28/2020   Lab Results  Component Value Date   VD25OH 40.1 11/06/2020   VD25OH 43 05/28/2020   VD25OH 29 (L) 05/02/2019   Lab Results  Component Value Date   WBC 6.7 05/28/2020   HGB 14.1 05/28/2020    HCT 41.5 05/28/2020   MCV 83.0 05/28/2020   PLT 306 05/28/2020   Lab Results  Component Value Date   IRON 47 01/15/2017   TIBC 448 01/15/2017   FERRITIN 122 06/18/2020   Attestation Statements:   Reviewed by clinician on day of visit: allergies, medications, problem list, medical history, surgical history, family history, social history, and previous encounter notes.  Time spent on visit including pre-visit chart review and post-visit care and charting was 21 minutes.   Coral Ceo, CMA, am acting as transcriptionist for [employer], DO.  I have reviewed the above documentation for accuracy and completeness, and I agree with the above. Marjory Sneddon, D.O.  The [place] was signed into law in 2016 which includes the topic of electronic health records.  This provides immediate access to information in MyChart.  This includes consultation notes, operative notes, office notes, lab results and pathology reports.  If you have any questions about what you read please let us know at your next visit so we can discuss your concerns and take corrective action if need be.  We are right here with you.

## 2020-12-17 ENCOUNTER — Other Ambulatory Visit: Payer: Self-pay

## 2020-12-17 ENCOUNTER — Encounter (INDEPENDENT_AMBULATORY_CARE_PROVIDER_SITE_OTHER): Payer: Self-pay | Admitting: Bariatrics

## 2020-12-17 ENCOUNTER — Ambulatory Visit: Payer: No Typology Code available for payment source | Admitting: Internal Medicine

## 2020-12-17 ENCOUNTER — Encounter: Payer: Self-pay | Admitting: Internal Medicine

## 2020-12-17 VITALS — BP 98/70 | HR 73 | Ht 67.0 in | Wt 213.0 lb

## 2020-12-17 DIAGNOSIS — E668 Other obesity: Secondary | ICD-10-CM

## 2020-12-17 DIAGNOSIS — E785 Hyperlipidemia, unspecified: Secondary | ICD-10-CM | POA: Diagnosis not present

## 2020-12-17 DIAGNOSIS — M791 Myalgia, unspecified site: Secondary | ICD-10-CM

## 2020-12-17 DIAGNOSIS — T466X5D Adverse effect of antihyperlipidemic and antiarteriosclerotic drugs, subsequent encounter: Secondary | ICD-10-CM

## 2020-12-17 DIAGNOSIS — T466X5A Adverse effect of antihyperlipidemic and antiarteriosclerotic drugs, initial encounter: Secondary | ICD-10-CM

## 2020-12-17 NOTE — Progress Notes (Signed)
LIPID CLINIC CONSULT NOTE  Chief Complaint:  Follow-up dyslipidemia  Primary Care Physician: Elby Showers, MD  Primary Cardiologist:  None  HPI:  Tricia Miranda is a 55 y.o. female who is being seen today for the evaluation of dyslipidemia at the request of Baxley, Cresenciano Lick, MD. This is a pleasant 55 year old female kindly referred for evaluation management of dyslipidemia.  Her PCP had referred her due to progressively worsening dyslipidemia for management recommendations.  According to Tricia Miranda she has had longstanding issues with high cholesterol and had previously at one point been on simvastatin which she said caused significant joint aches and feeling of overall muscle soreness.  Subsequently in the past had been changed over to rosuvastatin 5 mg daily which she said she took for a number of years without any significant side effects but then started to develop more significant muscle and joint aches including feelings of muscle pulsations.  Her rosuvastatin was then decreased to a point where she was only on it twice weekly.  Her LDL a year ago was 115 but has since risen and currently is 141, total cholesterol 229, HDL 50 and triglycerides 249.  This data was 3 months ago and she reports that she has been off of her statin medication since that time so I expect that her cholesterol may be higher.  Fortunately, it appears that she has few other cardiovascular risk factors.  She does not have any diabetes, hypertension, known coronary disease or prior to cardiovascular events.  There is heart disease in her father who had two-vessel bypass but was a smoker and her mother reportedly had very high cholesterol she says in the 400s and was on medication for a while but then was told to come off of it after heart catheterization showed no significant coronary disease but subsequently then was restarted on it.  She reports a variable but generally low saturated fat diet with fish, chicken and  vegetables and other healthy sources.  06/19/2020  Tricia Miranda returns today for follow-up. She underwent coronary calcium scoring in November which showed 0 coronary calcium however she was noted to have hepatic steatosis. She has been working on weight loss and was referred to the [facility] health weight management center. She did have repeat lipids however they are slightly higher. Total cholesterol now 239, HDL 56, triglycerides 151 and LDL 155 (up from 143). She said is actually been a difficult couple of months having to care for her husband who injured his leg while fishing.  12/17/2020  Tricia Miranda is seen today in follow-up.  She has been working with [employer] management for weight loss.  She says she is down about 24 pounds.  Today weight was 213 pounds.  Despite this, her cholesterol has increased.  Total cholesterol was 251 in July with HDL 58, triglycerides 122 (this is reduced) and LDL 171.  She still feels that she is able to get her cholesterol lower with just diet alone.  She mentioned that her cholesterol was quite low back when she weighed 150 pounds a number of years ago.  Since she had no coronary artery calcium, it is felt that she is not at a high short-term risk of not being on medication.   PMHx:  Past Medical History:  Diagnosis Date   Anxiety    Anxiety    Bronchitis    Cancer ([place])    basal cell- tiny   Cancer ([place])    Fibroids  uterine    High cholesterol    Hypothyroidism    Joint pain    Low iron    Low vitamin D level    Osteoarthritis    Sleep apnea    uses oral device for mild sleep apnea    Thyroid disease    hypothyroidism   Vitamin B deficiency     Past Surgical History:  Procedure Laterality Date   ABDOMINAL HYSTERECTOMY     COLONOSCOPY     with benign  polyp removal    DILITATION & CURRETTAGE/HYSTROSCOPY WITH VERSAPOINT RESECTION N/A 03/16/2014   Procedure: DILATATION & CURETTAGE/HYSTEROSCOPY WITH VERSAPOINT RESECTION;  Surgeon: [name], MD;  Location: [facility];  Service: Gynecology;  Laterality: N/A;  1 hr.   HYSTERECTOMY ABDOMINAL WITH SALPINGECTOMY Bilateral 11/15/2018   Procedure: HYSTERECTOMY ABDOMINAL WITH  BILATERAL SALPINGECTOMY;  Surgeon: [name], MD;  Location: [facility];  Service: Gynecology;  Laterality: Bilateral;   MOHS SURGERY Left a while- unknown   under left eyelid   WISDOM TOOTH EXTRACTION      FAMHx:  Family History  Problem Relation Age of Onset   High Cholesterol Mother    Sleep apnea Mother    Heart disease Father    Alcohol abuse Father    Anxiety disorder Father    Stomach cancer Other    Colon cancer Neg Hx    Esophageal cancer Neg Hx    Pancreatic cancer Neg Hx    Prostate cancer Neg Hx    Rectal cancer Neg Hx     SOCHx:   reports that she has never smoked. She has never used smokeless tobacco. She reports current alcohol use of about 3.0 standard drinks per week. She reports that she does not use drugs.  ALLERGIES:  No Known Allergies  ROS: Pertinent items noted in HPI and remainder of comprehensive ROS otherwise negative.  HOME MEDS: Current Outpatient Medications on File Prior to Visit  Medication Sig Dispense Refill   ALPRAZolam (XANAX) 0.25 MG tablet TAKE ONE TABLET BY MOUTH TWICE A DAY AS NEEDED 60 tablet 3   cetirizine (ZYRTEC) 10 MG tablet Take 10 mg by mouth daily as needed for allergies.     cyanocobalamin 1000 MCG tablet Take 1,000 mcg by mouth daily.     FLUoxetine (PROZAC) 10 MG tablet TAKE 1 TABLET BY MOUTH DAILY 90 tablet 1   SYNTHROID 112 MCG tablet TAKE ONE TABLET BY MOUTH EVERY MORNING BEFORE BREAKFAST 90 tablet 2   Vitamin D, Ergocalciferol, (DRISDOL) 1.25 MG (50000 UNIT) CAPS capsule Take 1 capsule (50,000 Units total) by mouth once a week. 12 capsule 0   ferrous sulfate 325 (65 FE) MG EC tablet Take 325 mg by mouth once a week. Pt takes 1 tablet every other day     No current facility-administered medications on file prior to  visit.    LABS/IMAGING: No results found for this or any previous visit (from the past 48 hour(s)).  No results found.  LIPID PANEL:    Component Value Date/Time   CHOL 251 (H) 11/06/2020 1235   TRIG 122 11/06/2020 1235   HDL 58 11/06/2020 1235   CHOLHDL 4.3 05/28/2020 1145   VLDL 26 11/08/2015 1026   LDLCALC 171 (H) 11/06/2020 1235   LDLCALC 155 (H) 05/28/2020 1145    WEIGHTS: Wt Readings from Last 3 Encounters:  12/17/20 213 lb (96.6 kg)  12/12/20 214 lb (97.1 kg)  12/11/20 212 lb (96.2 kg)    VITALS:  BP 98/70 (BP Location: Left Arm, Patient Position: Sitting, Cuff Size: Large)   Pulse 73   Ht '5\' 7"'$  (1.702 m)   Wt 213 lb (96.6 kg)   LMP 11/15/2018   BMI 33.36 kg/m   EXAM: Deferred  EKG: Deferred  ASSESSMENT: Mixed dyslipidemia Statin intolerance - myalgias Family history of high cholesterol Moderate obesity Zero CAC (02/2020)-hepatic steatosis  PLAN: 1.   Tricia Miranda has continued to have some weight loss however her cholesterol remains elevated.  I think there is a genetic component to this.  She will likely need therapy due to her long-term risk of high cholesterol even though she has no coronary calcium.  Apparently she had an LP(a) recently tested per her report, however, I cannot find the data - she had a "lipid cascade drawn" - i'm not sure what this consists of but cannot see the result in the computer - she said the LP(a) was normal. Since she wants to continue to work on diet and exercise at this point she can follow-up with me as needed if she desires therapy.  TIME SPENT WITH PATIENT: 30 minutes of direct patient care. More than 50% of that time was spent on coordination of care and counseling regarding dyslipidemia, weight management, cardiovascular risk, statin side effects.   Pixie Casino, MD, [facility], [place] Director of the Advanced Lipid Disorders &  Cardiovascular Risk Reduction Clinic Diplomate of the  American Board of Clinical Lipidology Attending Cardiologist  Direct Dial: [phone]  Fax: [phone]  Website:  [email or web address]  Nadean Corwin Yandiel Bergum 12/17/2020, 9:18 AM

## 2020-12-17 NOTE — Telephone Encounter (Signed)
Please review

## 2020-12-17 NOTE — Patient Instructions (Signed)

## 2021-01-02 ENCOUNTER — Ambulatory Visit (INDEPENDENT_AMBULATORY_CARE_PROVIDER_SITE_OTHER): Payer: No Typology Code available for payment source | Admitting: Family Medicine

## 2021-01-02 ENCOUNTER — Encounter (INDEPENDENT_AMBULATORY_CARE_PROVIDER_SITE_OTHER): Payer: Self-pay | Admitting: Family Medicine

## 2021-01-02 ENCOUNTER — Other Ambulatory Visit: Payer: Self-pay

## 2021-01-02 VITALS — BP 113/72 | HR 69 | Temp 98.4°F | Ht 67.0 in | Wt 210.0 lb

## 2021-01-02 DIAGNOSIS — Z6835 Body mass index (BMI) 35.0-35.9, adult: Secondary | ICD-10-CM | POA: Diagnosis not present

## 2021-01-02 DIAGNOSIS — E559 Vitamin D deficiency, unspecified: Secondary | ICD-10-CM

## 2021-01-02 DIAGNOSIS — E7849 Other hyperlipidemia: Secondary | ICD-10-CM | POA: Diagnosis not present

## 2021-01-02 DIAGNOSIS — Z9189 Other specified personal risk factors, not elsewhere classified: Secondary | ICD-10-CM

## 2021-01-02 MED ORDER — VITAMIN D (ERGOCALCIFEROL) 1.25 MG (50000 UNIT) PO CAPS: 50000.0000 [IU] | ORAL_CAPSULE | ORAL | 0 refills | Status: DC

## 2021-01-02 NOTE — Progress Notes (Signed)
Chief Complaint:   OBESITY Tricia Miranda is here to discuss her progress with her obesity treatment plan along with follow-up of her obesity related diagnoses. Tricia Miranda is on the Category 3 Plan and states she is following her eating plan approximately 92% of the time. Tricia Miranda states she is walking the dog and swimming 30-45 minutes 3-5 times per week.  Today's visit was #: 11 Starting weight: 224 lbs Starting date: 06/18/2020 Today's weight: 210 lbs Today's date: 01/02/2021 Total lbs lost to date: 14 Total lbs lost since last in-office visit: 2  Interim History: This is Tricia Miranda's first RTC since 12/11/2020. She celebrated her birthday with a day off from work and had a massage and dinner in [place]. She followed the plan very consistently. She is getting all food in, except for the last week where she missed occasional protein and not all snacks in. Pt wants to get back to swimming and to the gym.  Subjective:   1. Vitamin D deficiency Pt denies nausea, vomiting, and muscle weakness but notes fatigue. She is on prescription Vit D.  2. Other hyperlipidemia Tricia Miranda's last LDL was 171, HDL 58, and triglycerides 122. She is not on statin therapy.  3. At risk for osteoporosis Tricia Miranda is at higher risk of osteopenia and osteoporosis due to Vitamin D deficiency.   Assessment/Plan:   1. Vitamin D deficiency Low Vitamin D level contributes to fatigue and are associated with obesity, breast, and colon cancer. She agrees to continue to take prescription Vitamin D 50,000 IU every week and will follow-up for routine testing of Vitamin D, at least 2-3 times per year to avoid over-replacement.  Refill- Vitamin D, Ergocalciferol, (DRISDOL) 1.25 MG (50000 UNIT) CAPS capsule; Take 1 capsule (50,000 Units total) by mouth once a week.  Dispense: 12 capsule; Refill: 0  2. Other hyperlipidemia Cardiovascular risk and specific lipid/LDL goals reviewed.  We discussed several lifestyle modifications today and Tricia Miranda will  continue to work on diet, exercise and weight loss efforts. Orders and follow up as documented in patient record. Repeat labs in Nov/Dec 2022.  Counseling Intensive lifestyle modifications are the first line treatment for this issue. Dietary changes: Increase soluble fiber. Decrease simple carbohydrates. Exercise changes: Moderate to vigorous-intensity aerobic activity 150 minutes per week if tolerated. Lipid-lowering medications: see documented in medical record.  3. At risk for osteoporosis Tricia Miranda was given approximately 15 minutes of osteoporosis prevention counseling today. Tricia Miranda is at risk for osteopenia and osteoporosis due to her Vitamin D deficiency. She was encouraged to take her Vitamin D and follow her higher calcium diet and increase strengthening exercise to help strengthen her bones and decrease her risk of osteopenia and osteoporosis.  Repetitive spaced learning was employed today to elicit superior memory formation and behavioral change.  4. Obesity, current BMI 33.0  Tricia Miranda is currently in the action stage of change. As such, her goal is to continue with weight loss efforts. She has agreed to the Category 3 Plan.   Exercise goals:  As is  Behavioral modification strategies: increasing lean protein intake, meal planning and cooking strategies, and keeping healthy foods in the home.  Tricia Miranda has agreed to follow-up with our clinic in 3 weeks. She was informed of the importance of frequent follow-up visits to maximize her success with intensive lifestyle modifications for her multiple health conditions.   Objective:   Blood pressure 113/72, pulse 69, temperature 98.4 F (36.9 C), height '5\' 7"'$  (1.702 m), weight 210 lb (95.3 kg), last menstrual  period 11/15/2018, SpO2 98 %. Body mass index is 32.89 kg/m.  General: Cooperative, alert, well developed, in no acute distress. HEENT: Conjunctivae and lids unremarkable. Cardiovascular: Regular rhythm.  Lungs: Normal work of  breathing. Neurologic: No focal deficits.   Lab Results  Component Value Date   CREATININE 0.61 11/06/2020   BUN 14 11/06/2020   NA 140 11/06/2020   K 4.5 11/06/2020   CL 101 11/06/2020   CO2 24 11/06/2020   Lab Results  Component Value Date   ALT 17 11/06/2020   AST 15 11/06/2020   ALKPHOS 113 11/06/2020   BILITOT 0.4 11/06/2020   Lab Results  Component Value Date   HGBA1C 5.2 05/28/2020   HGBA1C 5.1 11/28/2019   HGBA1C 5.2 09/20/2014   HGBA1C 5.3 10/07/2010   Lab Results  Component Value Date   INSULIN 6.9 06/18/2020   Lab Results  Component Value Date   TSH 0.611 11/06/2020   Lab Results  Component Value Date   CHOL 251 (H) 11/06/2020   HDL 58 11/06/2020   LDLCALC 171 (H) 11/06/2020   TRIG 122 11/06/2020   CHOLHDL 4.3 05/28/2020   Lab Results  Component Value Date   VD25OH 40.1 11/06/2020   VD25OH 43 05/28/2020   VD25OH 29 (L) 05/02/2019   Lab Results  Component Value Date   WBC 6.7 05/28/2020   HGB 14.1 05/28/2020   HCT 41.5 05/28/2020   MCV 83.0 05/28/2020   PLT 306 05/28/2020   Lab Results  Component Value Date   IRON 47 01/15/2017   TIBC 448 01/15/2017   FERRITIN 122 06/18/2020    Attestation Statements:   Reviewed by clinician on day of visit: allergies, medications, problem list, medical history, surgical history, family history, social history, and previous encounter notes.  Coral Ceo, CMA, am acting as transcriptionist for Coralie Common, MD.   I have reviewed the above documentation for accuracy and completeness, and I agree with the above. - Coralie Common, MD

## 2021-01-10 ENCOUNTER — Other Ambulatory Visit: Payer: Self-pay

## 2021-01-10 ENCOUNTER — Encounter: Payer: Self-pay | Admitting: Obstetrics & Gynecology

## 2021-01-10 ENCOUNTER — Ambulatory Visit (INDEPENDENT_AMBULATORY_CARE_PROVIDER_SITE_OTHER): Payer: No Typology Code available for payment source | Admitting: Obstetrics & Gynecology

## 2021-01-10 VITALS — BP 110/78 | HR 68 | Resp 16 | Ht 66.25 in | Wt 213.0 lb

## 2021-01-10 DIAGNOSIS — Z6834 Body mass index (BMI) 34.0-34.9, adult: Secondary | ICD-10-CM | POA: Diagnosis not present

## 2021-01-10 DIAGNOSIS — Z01419 Encounter for gynecological examination (general) (routine) without abnormal findings: Secondary | ICD-10-CM | POA: Diagnosis not present

## 2021-01-10 DIAGNOSIS — E6609 Other obesity due to excess calories: Secondary | ICD-10-CM | POA: Diagnosis not present

## 2021-01-10 DIAGNOSIS — Z90710 Acquired absence of both cervix and uterus: Secondary | ICD-10-CM | POA: Diagnosis not present

## 2021-01-10 NOTE — Progress Notes (Signed)
Tricia Miranda 1965/08/10 [id number]   History:    55 y.o. G1P1L1 Separated.  Son is in [place] residence.   RP:  Established patient presenting for annual gyn exam    HPI: TAH/Bilateral Salpingectomy 11/15/2018.  No abdominopelvic pain.  No vaginal bleeding.  No fever.  Urine/BMs normal.  Breasts normal.  Mammo 10/2020, Left Dx mammo/US 11/2020 Neg.  BMI 34.12.  Health labs with Fam MD.  Tricia Miranda 2018.   Past medical history,surgical history, family history and social history were all reviewed and documented in the EPIC chart.  Gynecologic History Patient's last menstrual period was 11/15/2018.  Obstetric History OB History  Gravida Para Term Preterm AB Living  1 1       1   SAB IAB Ectopic Multiple Live Births               # Outcome Date GA Lbr Len/2nd Weight Sex Delivery Anes PTL Lv  1 Para              ROS: A ROS was performed and pertinent positives and negatives are included in the history.  GENERAL: No fevers or chills. HEENT: No change in vision, no earache, sore throat or sinus congestion. NECK: No pain or stiffness. CARDIOVASCULAR: No chest pain or pressure. No palpitations. PULMONARY: No shortness of breath, cough or wheeze. GASTROINTESTINAL: No abdominal pain, nausea, vomiting or diarrhea, melena or bright red blood per rectum. GENITOURINARY: No urinary frequency, urgency, hesitancy or dysuria. MUSCULOSKELETAL: No joint or muscle pain, no back pain, no recent trauma. DERMATOLOGIC: No rash, no itching, no lesions. ENDOCRINE: No polyuria, polydipsia, no heat or cold intolerance. No recent change in weight. HEMATOLOGICAL: No anemia or easy bruising or bleeding. NEUROLOGIC: No headache, seizures, numbness, tingling or weakness. PSYCHIATRIC: No depression, no loss of interest in normal activity or change in sleep pattern.     Exam:   BP 110/78   Pulse 68   Resp 16   Ht 5' 6.25" (1.683 m)   Wt 213 lb (96.6 kg)   LMP 11/15/2018   BMI 34.12 kg/m   Body mass index is 34.12  kg/m.  General appearance : Well developed well nourished female. No acute distress HEENT: Eyes: no retinal hemorrhage or exudates,  Neck supple, trachea midline, no carotid bruits, no thyroidmegaly Lungs: Clear to auscultation, no rhonchi or wheezes, or rib retractions  Heart: Regular rate and rhythm, no murmurs or gallops Breast:Examined in sitting and supine position were symmetrical in appearance, no palpable masses or tenderness,  no skin retraction, no nipple inversion, no nipple discharge, no skin discoloration, no axillary or supraclavicular lymphadenopathy Abdomen: no palpable masses or tenderness, no rebound or guarding Extremities: no edema or skin discoloration or tenderness  Pelvic: Vulva: Normal             Vagina: No gross lesions or discharge  Cervix/Uterus absent  Adnexa  Without masses or tenderness  Anus: Normal   Assessment/Plan:  55 y.o. female for annual exam   1. Well female exam with routine gynecological exam Gynecologic exam status post TAH.  Previous Paps normal with benign pathology on cervix after TAH in 2020, no indication for Pap test at this time.  Breast exam normal.  Screening mammogram negative on the right breast and diagnostic left mammogram and ultrasound negative in August 2022.  Colonoscopy 2018.  Health labs with family physician.  2. S/P TAH (total abdominal hysterectomy)  3. Class 1 obesity due to excess calories without serious comorbidity  with body mass index (BMI) of 34.0 to 34.9 in adult  Low calorie/carb diet.  Aerobic activities 5 times a week and light weightlifting every 2 days.  Tricia Bruins MD, 4:11 PM 01/10/2021

## 2021-01-12 NOTE — Patient Instructions (Signed)
Continue to work on diet and exercise.  You have made progress with healthy weight clinic.  Continue same dose of thyroid replacement.  Please address your concerns regarding statins with Dr. Debara Pickett.  I would like to see you continue with statin medication.  Continue Prozac.  Continue current dose of thyroid replacement.  Follow-up in 6 months for health maintenance exam.  Please have COVID booster and flu vaccine this Fall.

## 2021-01-14 ENCOUNTER — Encounter (INDEPENDENT_AMBULATORY_CARE_PROVIDER_SITE_OTHER): Payer: Self-pay | Admitting: Family Medicine

## 2021-01-14 ENCOUNTER — Ambulatory Visit (INDEPENDENT_AMBULATORY_CARE_PROVIDER_SITE_OTHER): Payer: No Typology Code available for payment source | Admitting: Family Medicine

## 2021-01-14 ENCOUNTER — Other Ambulatory Visit: Payer: Self-pay

## 2021-01-14 VITALS — BP 130/86 | HR 63 | Temp 97.7°F | Ht 67.0 in | Wt 210.0 lb

## 2021-01-14 DIAGNOSIS — Z6835 Body mass index (BMI) 35.0-35.9, adult: Secondary | ICD-10-CM

## 2021-01-14 DIAGNOSIS — E559 Vitamin D deficiency, unspecified: Secondary | ICD-10-CM

## 2021-01-14 DIAGNOSIS — Z9189 Other specified personal risk factors, not elsewhere classified: Secondary | ICD-10-CM | POA: Diagnosis not present

## 2021-01-14 MED ORDER — VITAMIN D (ERGOCALCIFEROL) 1.25 MG (50000 UNIT) PO CAPS: 50000.0000 [IU] | ORAL_CAPSULE | ORAL | 0 refills | Status: DC

## 2021-01-15 NOTE — Progress Notes (Signed)
Chief Complaint:   OBESITY Tricia Miranda is here to discuss her progress with her obesity treatment plan along with follow-up of her obesity related diagnoses. Tricia Miranda is on the Category 3 Plan and states she is following her eating plan approximately 90-95% of the time. Tricia Miranda states she is walking and stationary bike 30-45 minutes 4-5 times per week.  Today's visit was #: 12 Starting weight: 224 lbs Starting date: 06/18/2020 Today's weight: 210 lbs Today's date: 01/14/2021 Total lbs lost to date: 4 Total lbs lost since last in-office visit: 0  Interim History: Tricia Miranda's last OV was with Dr. Jearld Shines on 01/02/2021. She is cooking for just herself and finds it challenging. She eats out 3 nights a week. Pt is not measuring cooked proteins and says she "mentally weighs them".    Subjective:   1. Vitamin D deficiency She is currently taking prescription vitamin D 50,000 IU each week. She denies nausea, vomiting or muscle weakness.  Lab Results  Component Value Date   VD25OH 40.1 11/06/2020   VD25OH 43 05/28/2020   VD25OH 29 (L) 05/02/2019   2. At risk for constipation Tricia Miranda is at increased risk for constipation due to meal plan and increased protein.  Assessment/Plan:  No orders of the defined types were placed in this encounter.   Medications Discontinued During This Encounter  Medication Reason   Vitamin D, Ergocalciferol, (DRISDOL) 1.25 MG (50000 UNIT) CAPS capsule Reorder     Meds ordered this encounter  Medications   Vitamin D, Ergocalciferol, (DRISDOL) 1.25 MG (50000 UNIT) CAPS capsule    Sig: Take 1 capsule (50,000 Units total) by mouth once a week.    Dispense:  4 capsule    Refill:  0     1. Vitamin D deficiency Low Vitamin D level contributes to fatigue and are associated with obesity, breast, and colon cancer. She agrees to continue to take prescription Vitamin D 50,000 IU every week and will follow-up for routine testing of Vitamin D, at least 2-3 times per year to avoid  over-replacement.  Refill- Vitamin D, Ergocalciferol, (DRISDOL) 1.25 MG (50000 UNIT) CAPS capsule; Take 1 capsule (50,000 Units total) by mouth once a week.  Dispense: 4 capsule; Refill: 0  2. At risk for constipation Tricia Miranda was given approximately 9 minutes of counseling today regarding prevention of constipation. She was encouraged to increase water and fiber intake.    3. Obesity with current BMI of 32.9  Tricia Miranda is currently in the action stage of change. As such, her goal is to continue with weight loss efforts. She has agreed to the Category 3 Plan.   [place]ricia Miranda [place]. Strategies for success discussed with pt. Increase exercise QD.  Exercise goals:  As is  Behavioral modification strategies: travel eating strategies and avoiding temptations.  Tricia Miranda has agreed to follow-up with our clinic in 3 weeks. She was informed of the importance of frequent follow-up visits to maximize her success with intensive lifestyle modifications for her multiple health conditions.   Objective:   Blood pressure 130/86, pulse 63, temperature 97.7 F (36.5 C), height 5\' 7"  (1.702 m), weight 210 lb (95.3 kg), last menstrual period 11/15/2018, SpO2 96 %. Body mass index is 32.89 kg/m.  General: Cooperative, alert, well developed, in no acute distress. HEENT: Conjunctivae and lids unremarkable. Cardiovascular: Regular rhythm.  Lungs: Normal work of breathing. Neurologic: No focal deficits.   Lab Results  Component Value Date   CREATININE 0.61 11/06/2020  BUN 14 11/06/2020   NA 140 11/06/2020   K 4.5 11/06/2020   CL 101 11/06/2020   CO2 24 11/06/2020   Lab Results  Component Value Date   ALT 17 11/06/2020   AST 15 11/06/2020   ALKPHOS 113 11/06/2020   BILITOT 0.4 11/06/2020   Lab Results  Component Value Date   HGBA1C 5.2 05/28/2020   HGBA1C 5.1 11/28/2019   HGBA1C 5.2 09/20/2014   HGBA1C 5.3 10/07/2010   Lab Results  Component Value Date   INSULIN 6.9  06/18/2020   Lab Results  Component Value Date   TSH 0.611 11/06/2020   Lab Results  Component Value Date   CHOL 251 (H) 11/06/2020   HDL 58 11/06/2020   LDLCALC 171 (H) 11/06/2020   TRIG 122 11/06/2020   CHOLHDL 4.3 05/28/2020   Lab Results  Component Value Date   VD25OH 40.1 11/06/2020   VD25OH 43 05/28/2020   VD25OH 29 (L) 05/02/2019   Lab Results  Component Value Date   WBC 6.7 05/28/2020   HGB 14.1 05/28/2020   HCT 41.5 05/28/2020   MCV 83.0 05/28/2020   PLT 306 05/28/2020   Lab Results  Component Value Date   IRON 47 01/15/2017   TIBC 448 01/15/2017   FERRITIN 122 06/18/2020   Attestation Statements:   Reviewed by clinician on day of visit: allergies, medications, problem list, medical history, surgical history, family history, social history, and previous encounter notes.  Tricia Miranda, CMA, am acting as transcriptionist for [employer], DO.  I have reviewed the above documentation for accuracy and completeness, and I agree with the above. Tricia Miranda, D.O.  The [place] was signed into law in 2016 which includes the topic of electronic health records.  This provides immediate access to information in MyChart.  This includes consultation notes, operative notes, office notes, lab results and pathology reports.  If you have any questions about what you read please let us know at your next visit so we can discuss your concerns and take corrective action if need be.  We are right here with you.

## 2021-01-30 ENCOUNTER — Other Ambulatory Visit: Payer: Self-pay

## 2021-01-30 ENCOUNTER — Ambulatory Visit (INDEPENDENT_AMBULATORY_CARE_PROVIDER_SITE_OTHER): Payer: No Typology Code available for payment source | Admitting: Bariatrics

## 2021-01-30 ENCOUNTER — Encounter (INDEPENDENT_AMBULATORY_CARE_PROVIDER_SITE_OTHER): Payer: Self-pay | Admitting: Bariatrics

## 2021-01-30 VITALS — BP 131/75 | HR 60 | Temp 98.2°F | Ht 67.0 in | Wt 210.0 lb

## 2021-01-30 DIAGNOSIS — E039 Hypothyroidism, unspecified: Secondary | ICD-10-CM | POA: Diagnosis not present

## 2021-01-30 DIAGNOSIS — E782 Mixed hyperlipidemia: Secondary | ICD-10-CM

## 2021-01-30 DIAGNOSIS — Z6835 Body mass index (BMI) 35.0-35.9, adult: Secondary | ICD-10-CM

## 2021-01-30 NOTE — Progress Notes (Signed)
Chief Complaint:   OBESITY Tricia Miranda is here to discuss her progress with her obesity treatment plan along with follow-up of her obesity related diagnoses. [name] is on the Category 3 Plan and states she is following her eating plan approximately 80% of the time. [name] states she is hiking for 20-30 minutes 5 times per week.  Today's visit was #: 15 Starting weight: 224 lbs Starting date: 06/18/2020 Today's weight: 210 lbs Today's date: 01/30/2021 Total lbs lost to date: 14 lbs Total lbs lost since last in-office visit: 0  Interim History: Tricia Miranda weight remains the same. She has been on vacation and weight is stable. She want to ea more in the winter.  Subjective:   1. Mixed hyperlipidemia Tricia Miranda is not on medications currently.  2. Hypothyroidism, unspecified type Tricia Miranda is taking Synthroid currently.  Assessment/Plan:   1. Mixed hyperlipidemia Cardiovascular risk and specific lipid/LDL goals reviewed.  We discussed several lifestyle modifications today and [name] will continue to work on diet, exercise and weight loss efforts.[name] will increase healthy fats (PUFAs and MUFAs). She will have zero trans fats and limited saturated fats. Orders and follow up as documented in patient record.   Counseling Intensive lifestyle modifications are the first line treatment for this issue. Dietary changes: Increase soluble fiber. Decrease simple carbohydrates. Exercise changes: Moderate to vigorous-intensity aerobic activity 150 minutes per week if tolerated. Lipid-lowering medications: see documented in medical record.   2. Hypothyroidism, unspecified type [name] will continue taking Synthroid. Orders and follow up as documented in patient record.  Counseling Good thyroid control is important for overall health. Supratherapeutic thyroid levels are dangerous and will not improve weight loss results. Counseling: The correct way to take levothyroxine is fasting, with water, separated by at  least 30 minutes from breakfast, and separated by more than 4 hours from calcium, iron, multivitamins, acid reflux medications (PPIs).    3. Obesity with current BMI of 32.9 Tricia Miranda is currently in the action stage of change. As such, her goal is to continue with weight loss efforts. She has agreed to the Category 3 Plan.   Tricia Miranda will continue meal planning. She will continue with intentional eating.  Exercise goals:  As is.  Behavioral modification strategies: increasing lean protein intake, decreasing simple carbohydrates, increasing vegetables, increasing water intake, decreasing eating out, no skipping meals, meal planning and cooking strategies, keeping healthy foods in the home, and planning for success.  Tricia Miranda has agreed to follow-up with our clinic in 3 weeks. She was informed of the importance of frequent follow-up visits to maximize her success with intensive lifestyle modifications for her multiple health conditions.   Objective:   Blood pressure 131/75, pulse 60, temperature 98.2 F (36.8 C), height 5\' 7"  (1.702 m), weight 210 lb (95.3 kg), last menstrual period 11/15/2018, SpO2 100 %. Body mass index is 32.89 kg/m.  General: Cooperative, alert, well developed, in no acute distress. HEENT: Conjunctivae and lids unremarkable. Cardiovascular: Regular rhythm.  Lungs: Normal work of breathing. Neurologic: No focal deficits.   Lab Results  Component Value Date   CREATININE 0.61 11/06/2020   BUN 14 11/06/2020   NA 140 11/06/2020   K 4.5 11/06/2020   CL 101 11/06/2020   CO2 24 11/06/2020   Lab Results  Component Value Date   ALT 17 11/06/2020   AST 15 11/06/2020   ALKPHOS 113 11/06/2020   BILITOT 0.4 11/06/2020   Lab Results  Component Value Date   HGBA1C 5.2 05/28/2020   HGBA1C 5.1  11/28/2019   HGBA1C 5.2 09/20/2014   HGBA1C 5.3 10/07/2010   Lab Results  Component Value Date   INSULIN 6.9 06/18/2020   Lab Results  Component Value Date   TSH 0.611 11/06/2020    Lab Results  Component Value Date   CHOL 251 (H) 11/06/2020   HDL 58 11/06/2020   LDLCALC 171 (H) 11/06/2020   TRIG 122 11/06/2020   CHOLHDL 4.3 05/28/2020   Lab Results  Component Value Date   VD25OH 40.1 11/06/2020   VD25OH 43 05/28/2020   VD25OH 29 (L) 05/02/2019   Lab Results  Component Value Date   WBC 6.7 05/28/2020   HGB 14.1 05/28/2020   HCT 41.5 05/28/2020   MCV 83.0 05/28/2020   PLT 306 05/28/2020   Lab Results  Component Value Date   IRON 47 01/15/2017   TIBC 448 01/15/2017   FERRITIN 122 06/18/2020   Attestation Statements:   Reviewed by clinician on day of visit: allergies, medications, problem list, medical history, surgical history, family history, social history, and previous encounter notes.    I, Lizbeth Bark, RMA, am acting as Location manager for [employer], DO.   I have reviewed the above documentation for accuracy and completeness, and I agree with the above. Jearld Lesch, DO

## 2021-02-04 ENCOUNTER — Encounter (INDEPENDENT_AMBULATORY_CARE_PROVIDER_SITE_OTHER): Payer: Self-pay | Admitting: Bariatrics

## 2021-02-10 ENCOUNTER — Telehealth: Payer: Self-pay | Admitting: Orthopedic Surgery

## 2021-02-10 NOTE — Telephone Encounter (Signed)
Can you call patient and advise her that her next available gel injection would need to be after 03/02/2021 due to last injection being on 08/30/2020?  Thank you

## 2021-02-10 NOTE — Telephone Encounter (Signed)
Pt called wondering if she could get another bil gel injection  Cb [phone]

## 2021-02-10 NOTE — Telephone Encounter (Signed)
Contacted patient and informed her that process would be after 03/02/2021. Would you like me to send you a reminder closer to that date.

## 2021-02-10 NOTE — Telephone Encounter (Signed)
No, but thank you. I have them written down.

## 2021-02-12 ENCOUNTER — Telehealth: Payer: Self-pay

## 2021-02-12 NOTE — Telephone Encounter (Signed)
VOB submitted for Durolane, bilateral knee. Pending BV. Appt. Needs to be after 03/02/2021

## 2021-02-17 ENCOUNTER — Telehealth: Payer: Self-pay

## 2021-02-17 NOTE — Telephone Encounter (Signed)
Approved for Durolane, bilateral knee. [place] Patient will be responsible for 15% OOP Co-pay of $30.00 [place] Approval# [id number] Valid 08/13/2020- 08/12/2021  Appt. 03/10/2021 with Dr. Marlou Sa

## 2021-02-20 ENCOUNTER — Ambulatory Visit (INDEPENDENT_AMBULATORY_CARE_PROVIDER_SITE_OTHER): Payer: No Typology Code available for payment source | Admitting: Bariatrics

## 2021-03-03 ENCOUNTER — Encounter (INDEPENDENT_AMBULATORY_CARE_PROVIDER_SITE_OTHER): Payer: Self-pay | Admitting: Bariatrics

## 2021-03-03 ENCOUNTER — Other Ambulatory Visit: Payer: Self-pay

## 2021-03-03 ENCOUNTER — Ambulatory Visit (INDEPENDENT_AMBULATORY_CARE_PROVIDER_SITE_OTHER): Payer: No Typology Code available for payment source | Admitting: Bariatrics

## 2021-03-03 VITALS — BP 119/80 | HR 61 | Temp 97.9°F | Ht 67.0 in | Wt 207.0 lb

## 2021-03-03 DIAGNOSIS — E7849 Other hyperlipidemia: Secondary | ICD-10-CM

## 2021-03-03 DIAGNOSIS — E039 Hypothyroidism, unspecified: Secondary | ICD-10-CM

## 2021-03-03 DIAGNOSIS — Z6835 Body mass index (BMI) 35.0-35.9, adult: Secondary | ICD-10-CM

## 2021-03-03 NOTE — Progress Notes (Signed)
Chief Complaint:   OBESITY Tricia Miranda is here to discuss her progress with her obesity treatment plan along with follow-up of her obesity related diagnoses. Tricia Miranda is on the Category 3 Plan and states she is following her eating plan approximately 90-95% of the time. Tricia Miranda states she is doing 0 minutes 0 times per week.  Today's visit was #: 14 Starting weight: 224 lbs Starting date: 06/18/2020 Today's weight: 207 lbs Today's date: 03/03/2021 Total lbs lost to date: 17 lbs Total lbs lost since last in-office visit: 3 lbs  Interim History: Tricia Miranda is down another 3 lbs since her last visit. She had been sick with RSV for 1 1/2 weeks. She is doing well her protein.   Subjective:   1. Other hyperlipidemia Tricia Miranda is not on medications currently.  2. Hypothyroidism, unspecified type Tricia Miranda is currently taking Synthroid.  Assessment/Plan:   1. Other hyperlipidemia Cardiovascular risk and specific lipid/LDL goals reviewed.  We discussed several lifestyle modifications today and Tricia Miranda will continue to work on plan, exercise and weight loss efforts. Orders and follow up as documented in patient record.   Counseling Intensive lifestyle modifications are the first line treatment for this issue. Dietary changes: Increase soluble fiber. Decrease simple carbohydrates. Exercise changes: Moderate to vigorous-intensity aerobic activity 150 minutes per week if tolerated. Lipid-lowering medications: see documented in medical record.   2. Hypothyroidism, unspecified type Tricia Miranda will continue Synthroid. Orders and follow up as documented in patient record.  Counseling Good thyroid control is important for overall health. Supratherapeutic thyroid levels are dangerous and will not improve weight loss results. Counseling: The correct way to take levothyroxine is fasting, with water, separated by at least 30 minutes from breakfast, and separated by more than 4 hours from calcium, iron, multivitamins, acid  reflux medications (PPIs).    3. Obesity with current BMI of 32.5 Tricia Miranda is currently in the action stage of change. As such, her goal is to continue with weight loss efforts. She has agreed to the Category 3 Plan.   Tricia Miranda will adhere closely to the plan. She will be mindful eating.  Exercise goals: Tricia Miranda will start gym routine and swimming.  Behavioral modification strategies: increasing lean protein intake, decreasing simple carbohydrates, increasing vegetables, increasing water intake, decreasing eating out, no skipping meals, meal planning and cooking strategies, keeping healthy foods in the home, and planning for success.  Tricia Miranda has agreed to follow-up with our clinic in 3 weeks with Jake Bathe, FNP or Abby Potash, PA-C. 6 weeks with myself (fasting). She was informed of the importance of frequent follow-up visits to maximize her success with intensive lifestyle modifications for her multiple health conditions.   Objective:   Blood pressure 119/80, pulse 61, temperature 97.9 F (36.6 C), height 5\' 7"  (1.702 m), weight 207 lb (93.9 kg), last menstrual period 11/15/2018, SpO2 99 %. Body mass index is 32.42 kg/m.  General: Cooperative, alert, well developed, in no acute distress. HEENT: Conjunctivae and lids unremarkable. Cardiovascular: Regular rhythm.  Lungs: Normal work of breathing. Neurologic: No focal deficits.   Lab Results  Component Value Date   CREATININE 0.61 11/06/2020   BUN 14 11/06/2020   NA 140 11/06/2020   K 4.5 11/06/2020   CL 101 11/06/2020   CO2 24 11/06/2020   Lab Results  Component Value Date   ALT 17 11/06/2020   AST 15 11/06/2020   ALKPHOS 113 11/06/2020   BILITOT 0.4 11/06/2020   Lab Results  Component Value Date   HGBA1C 5.2  05/28/2020   HGBA1C 5.1 11/28/2019   HGBA1C 5.2 09/20/2014   HGBA1C 5.3 10/07/2010   Lab Results  Component Value Date   INSULIN 6.9 06/18/2020   Lab Results  Component Value Date   TSH 0.611 11/06/2020    Lab Results  Component Value Date   CHOL 251 (H) 11/06/2020   HDL 58 11/06/2020   LDLCALC 171 (H) 11/06/2020   TRIG 122 11/06/2020   CHOLHDL 4.3 05/28/2020   Lab Results  Component Value Date   VD25OH 40.1 11/06/2020   VD25OH 43 05/28/2020   VD25OH 29 (L) 05/02/2019   Lab Results  Component Value Date   WBC 6.7 05/28/2020   HGB 14.1 05/28/2020   HCT 41.5 05/28/2020   MCV 83.0 05/28/2020   PLT 306 05/28/2020   Lab Results  Component Value Date   IRON 47 01/15/2017   TIBC 448 01/15/2017   FERRITIN 122 06/18/2020   Attestation Statements:   Reviewed by clinician on day of visit: allergies, medications, problem list, medical history, surgical history, family history, social history, and previous encounter notes.  I, Lizbeth Bark, RMA, am acting as Location manager for [employer], DO.   I have reviewed the above documentation for accuracy and completeness, and I agree with the above. Jearld Lesch, DO

## 2021-03-10 ENCOUNTER — Other Ambulatory Visit: Payer: Self-pay

## 2021-03-10 ENCOUNTER — Ambulatory Visit (INDEPENDENT_AMBULATORY_CARE_PROVIDER_SITE_OTHER): Payer: No Typology Code available for payment source | Admitting: Orthopedic Surgery

## 2021-03-10 DIAGNOSIS — M1712 Unilateral primary osteoarthritis, left knee: Secondary | ICD-10-CM | POA: Diagnosis not present

## 2021-03-10 DIAGNOSIS — M1711 Unilateral primary osteoarthritis, right knee: Secondary | ICD-10-CM | POA: Diagnosis not present

## 2021-03-11 MED ORDER — LIDOCAINE HCL 1 % IJ SOLN
5.0000 mL | INTRAMUSCULAR | Status: AC | PRN
  Administered 2021-03-10: 5 mL

## 2021-03-11 MED ORDER — SODIUM HYALURONATE 60 MG/3ML IX PRSY
60.0000 mg | PREFILLED_SYRINGE | INTRA_ARTICULAR | Status: AC | PRN
  Administered 2021-03-10: 60 mg via INTRA_ARTICULAR

## 2021-03-11 NOTE — Progress Notes (Signed)
   Procedure Note  Patient: Tricia Miranda             Date of Birth: Mar 28, 1966           MRN: [id number]             Visit Date: 03/10/2021  Procedures: Visit Diagnoses:  1. Unilateral primary osteoarthritis, left knee   2. Unilateral primary osteoarthritis, right knee     Large Joint Inj: R knee on 03/10/2021 6:44 PM Indications: diagnostic evaluation, joint swelling and pain Details: 18 G 1.5 in needle, superolateral approach  Arthrogram: No  Medications: 5 mL lidocaine 1 %; 60 mg Sodium Hyaluronate 60 MG/3ML Outcome: tolerated well, no immediate complications Procedure, treatment alternatives, risks and benefits explained, specific risks discussed. Consent was given by the patient. Immediately prior to procedure a time out was called to verify the correct patient, procedure, equipment, support staff and site/side marked as required. Patient was prepped and draped in the usual sterile fashion.    Large Joint Inj: L knee on 03/10/2021 6:44 PM Indications: diagnostic evaluation, joint swelling and pain Details: 18 G 1.5 in needle, superolateral approach  Arthrogram: No  Medications: 5 mL lidocaine 1 % Outcome: tolerated well, no immediate complications Procedure, treatment alternatives, risks and benefits explained, specific risks discussed. Consent was given by the patient. Immediately prior to procedure a time out was called to verify the correct patient, procedure, equipment, support staff and site/side marked as required. Patient was prepped and draped in the usual sterile fashion.

## 2021-03-25 ENCOUNTER — Other Ambulatory Visit: Payer: Self-pay | Admitting: Internal Medicine

## 2021-03-27 ENCOUNTER — Encounter (INDEPENDENT_AMBULATORY_CARE_PROVIDER_SITE_OTHER): Payer: Self-pay | Admitting: Family Medicine

## 2021-03-27 ENCOUNTER — Ambulatory Visit (INDEPENDENT_AMBULATORY_CARE_PROVIDER_SITE_OTHER): Payer: No Typology Code available for payment source | Admitting: Family Medicine

## 2021-03-27 ENCOUNTER — Other Ambulatory Visit: Payer: Self-pay

## 2021-03-27 VITALS — BP 124/85 | HR 69 | Temp 97.7°F | Ht 67.0 in | Wt 206.0 lb

## 2021-03-27 DIAGNOSIS — Z9189 Other specified personal risk factors, not elsewhere classified: Secondary | ICD-10-CM | POA: Diagnosis not present

## 2021-03-27 DIAGNOSIS — Z6835 Body mass index (BMI) 35.0-35.9, adult: Secondary | ICD-10-CM

## 2021-03-27 DIAGNOSIS — E559 Vitamin D deficiency, unspecified: Secondary | ICD-10-CM | POA: Diagnosis not present

## 2021-03-27 MED ORDER — VITAMIN D (ERGOCALCIFEROL) 1.25 MG (50000 UNIT) PO CAPS: 50000.0000 [IU] | ORAL_CAPSULE | ORAL | 0 refills | Status: DC

## 2021-03-27 NOTE — Progress Notes (Signed)
Chief Complaint:   OBESITY Tricia Miranda is here to discuss her progress with her obesity treatment plan along with follow-up of her obesity related diagnoses. Tricia Miranda is on the Category 3 Plan and states she is following her eating plan approximately 85-90% of the time. Tricia Miranda states she is not currently exercising.  Today's visit was #: 15 Starting weight: 224 lbs Starting date: 06/18/2020 Today's weight: 206 lbs Today's date: 03/27/2021 Total lbs lost to date: 18 Total lbs lost since last in-office visit: 1  Interim History: Tricia Miranda is here for a follow up office visit.  We reviewed her meal plan and questions were answered.  Patient's food recall appears to be accurate and consistent with what is on plan when she is following it.   When eating on plan, her hunger and cravings are well controlled.    Subjective:   1. Vitamin D deficiency She is currently taking prescription vitamin D 50,000 IU each week. She denies nausea, vomiting or muscle weakness.  2. At risk for dehydration Tricia Miranda is at risk for dehydration due to increasing protein intake.  Assessment/Plan:  No orders of the defined types were placed in this encounter.   Medications Discontinued During This Encounter  Medication Reason   Vitamin D, Ergocalciferol, (DRISDOL) 1.25 MG (50000 UNIT) CAPS capsule Reorder     Meds ordered this encounter  Medications   Vitamin D, Ergocalciferol, (DRISDOL) 1.25 MG (50000 UNIT) CAPS capsule    Sig: Take 1 capsule (50,000 Units total) by mouth once a week.    Dispense:  4 capsule    Refill:  0     1. Vitamin D deficiency Low Vitamin D level contributes to fatigue and are associated with obesity, breast, and colon cancer. She agrees to continue to take prescription Vitamin D 50,000 IU every week and will follow-up for routine testing of Vitamin D, at least 2-3 times per year to avoid over-replacement.  Refill- Vitamin D, Ergocalciferol, (DRISDOL) 1.25 MG (50000 UNIT) CAPS  capsule; Take 1 capsule (50,000 Units total) by mouth once a week.  Dispense: 4 capsule; Refill: 0  2. At risk for dehydration Tricia Miranda was given approximately 15 minutes dehydration prevention counseling today. Tricia Miranda is at risk for dehydration due to weight loss and current medication(s). She was encouraged to hydrate and monitor fluid status to avoid dehydration as well as weight loss plateaus.   3. Obesity with current BMI of 32.3  Tricia Miranda is currently in the action stage of change. As such, her goal is to continue with weight loss efforts. She has agreed to the Category 3 Plan.   Mindful eating discussed with pt and handout provided.   Exercise goals: All adults should avoid inactivity. Some physical activity is better than none, and adults who participate in any amount of physical activity gain some health benefits. Increase walking/exercise.  Behavioral modification strategies: increasing lean protein intake, increasing water intake, and planning for success.  Tricia Miranda has agreed to follow-up with our clinic in 2 weeks. She was informed of the importance of frequent follow-up visits to maximize her success with intensive lifestyle modifications for her multiple health conditions.   Objective:   Blood pressure 124/85, pulse 69, temperature 97.7 F (36.5 C), height 5\' 7"  (1.702 m), weight 206 lb (93.4 kg), last menstrual period 11/15/2018, SpO2 99 %. Body mass index is 32.26 kg/m.  General: Cooperative, alert, well developed, in no acute distress. HEENT: Conjunctivae and lids unremarkable. Cardiovascular: Regular rhythm.  Lungs: Normal work  of breathing. Neurologic: No focal deficits.   Lab Results  Component Value Date   CREATININE 0.61 11/06/2020   BUN 14 11/06/2020   NA 140 11/06/2020   K 4.5 11/06/2020   CL 101 11/06/2020   CO2 24 11/06/2020   Lab Results  Component Value Date   ALT 17 11/06/2020   AST 15 11/06/2020   ALKPHOS 113 11/06/2020   BILITOT 0.4 11/06/2020   Lab  Results  Component Value Date   HGBA1C 5.2 05/28/2020   HGBA1C 5.1 11/28/2019   HGBA1C 5.2 09/20/2014   HGBA1C 5.3 10/07/2010   Lab Results  Component Value Date   INSULIN 6.9 06/18/2020   Lab Results  Component Value Date   TSH 0.611 11/06/2020   Lab Results  Component Value Date   CHOL 251 (H) 11/06/2020   HDL 58 11/06/2020   LDLCALC 171 (H) 11/06/2020   TRIG 122 11/06/2020   CHOLHDL 4.3 05/28/2020   Lab Results  Component Value Date   VD25OH 40.1 11/06/2020   VD25OH 43 05/28/2020   VD25OH 29 (L) 05/02/2019   Lab Results  Component Value Date   WBC 6.7 05/28/2020   HGB 14.1 05/28/2020   HCT 41.5 05/28/2020   MCV 83.0 05/28/2020   PLT 306 05/28/2020   Lab Results  Component Value Date   IRON 47 01/15/2017   TIBC 448 01/15/2017   FERRITIN 122 06/18/2020    Attestation Statements:   Reviewed by clinician on day of visit: allergies, medications, problem list, medical history, surgical history, family history, social history, and previous encounter notes.  Coral Ceo, CMA, am acting as transcriptionist for [employer], DO.  I have reviewed the above documentation for accuracy and completeness, and I agree with the above. Marjory Sneddon, D.O.  [place] was signed into law in 2016 which includes the topic of electronic health records.  This provides immediate access to information in MyChart.  This includes consultation notes, operative notes, office notes, lab results and pathology reports.  If you have any questions about what you read please let us know at your next visit so we can discuss your concerns and take corrective action if need be.  We are right here with you.

## 2021-04-10 ENCOUNTER — Ambulatory Visit (INDEPENDENT_AMBULATORY_CARE_PROVIDER_SITE_OTHER): Payer: No Typology Code available for payment source | Admitting: Bariatrics

## 2021-05-01 ENCOUNTER — Other Ambulatory Visit: Payer: Self-pay

## 2021-05-01 ENCOUNTER — Ambulatory Visit (INDEPENDENT_AMBULATORY_CARE_PROVIDER_SITE_OTHER): Payer: No Typology Code available for payment source | Admitting: Physician Assistant

## 2021-05-01 ENCOUNTER — Encounter (INDEPENDENT_AMBULATORY_CARE_PROVIDER_SITE_OTHER): Payer: Self-pay | Admitting: Physician Assistant

## 2021-05-01 VITALS — BP 135/84 | HR 67 | Temp 98.2°F | Ht 67.0 in | Wt 205.0 lb

## 2021-05-01 DIAGNOSIS — E559 Vitamin D deficiency, unspecified: Secondary | ICD-10-CM | POA: Diagnosis not present

## 2021-05-01 DIAGNOSIS — R7309 Other abnormal glucose: Secondary | ICD-10-CM

## 2021-05-01 DIAGNOSIS — E039 Hypothyroidism, unspecified: Secondary | ICD-10-CM | POA: Diagnosis not present

## 2021-05-01 DIAGNOSIS — Z6832 Body mass index (BMI) 32.0-32.9, adult: Secondary | ICD-10-CM

## 2021-05-01 DIAGNOSIS — E7849 Other hyperlipidemia: Secondary | ICD-10-CM

## 2021-05-01 DIAGNOSIS — E669 Obesity, unspecified: Secondary | ICD-10-CM

## 2021-05-01 DIAGNOSIS — Z9189 Other specified personal risk factors, not elsewhere classified: Secondary | ICD-10-CM

## 2021-05-01 MED ORDER — VITAMIN D (ERGOCALCIFEROL) 1.25 MG (50000 UNIT) PO CAPS: 50000.0000 [IU] | ORAL_CAPSULE | ORAL | 0 refills | Status: DC

## 2021-05-02 ENCOUNTER — Encounter: Payer: Self-pay | Admitting: Internal Medicine

## 2021-05-02 LAB — LIPID PANEL
Chol/HDL Ratio: 3.8 ratio (ref 0.0–4.4)
Cholesterol, Total: 265 mg/dL — ABNORMAL HIGH (ref 100–199)
HDL: 70 mg/dL (ref >39)
LDL Chol Calc (NIH): 178 mg/dL — ABNORMAL HIGH (ref 0–99)
Triglycerides: 100 mg/dL (ref 0–149)
VLDL Cholesterol Cal: 17 mg/dL (ref 5–40)

## 2021-05-05 NOTE — Telephone Encounter (Signed)
Last seen in 11/2020 - will need updated labs and follow-up lipid appointment with me to discuss. - can be virtual  Dr Lemmie Evens

## 2021-05-05 NOTE — Progress Notes (Signed)
Chief Complaint:   OBESITY Tricia Miranda is here to discuss her progress with her obesity treatment plan along with follow-up of her obesity related diagnoses. Tricia Miranda is on the Category 3 Plan and states she is following her eating plan approximately 80% of the time. Tricia Miranda states she is doing Pilates 50 minutes 1 times per week.  Today's visit was #: 50 Starting weight: 224 lbs Starting date: 06/18/2020 Today's weight: 205 lbs Today's date: 05/01/2021 Total lbs lost to date: 19 Total lbs lost since last in-office visit: 1  Interim History: Pt did well with PC over the holidays. She enjoys category 3 and finds it relatively easy to follow. She wants to start exercising and strength training more often.  Subjective:   1. Vitamin D deficiency Tricia Miranda is on Rx Vit D weekly.  2. Elevated glucose She denies polyphagia and is not on meds.  3. Other hyperlipidemia Pt's last lipid panel was not within normal range. She is not on statin therapy.  4. Hypothyroidism, unspecified type Pt is on Synthroid. Her last thyroid panel was within normal range.  5. At risk for heart disease Tricia Miranda is at a higher than average risk for cardiovascular disease due to obesity.   Assessment/Plan:   1. Vitamin D deficiency Low Vitamin D level contributes to fatigue and are associated with obesity, breast, and colon cancer. She agrees to continue to take prescription Vitamin D @50 ,000 IU every week and will follow-up for routine testing of Vitamin D, at least 2-3 times per year to avoid over-replacement. Check labs today.  Refill- Vitamin D, Ergocalciferol, (DRISDOL) 1.25 MG (50000 UNIT) CAPS capsule; Take 1 capsule (50,000 Units total) by mouth once a week.  Dispense: 4 capsule; Refill: 0  - VITAMIN D 25 Hydroxy (Vit-D Deficiency, Fractures)  2. Elevated glucose Continue current treatment plan. Check labs today.  - Comprehensive metabolic panel - Hemoglobin A1c - Insulin, random  3. Other  hyperlipidemia Cardiovascular risk and specific lipid/LDL goals reviewed.  We discussed several lifestyle modifications today and Tricia Miranda will continue to work on diet, exercise and weight loss efforts. Orders and follow up as documented in patient record.   Counseling Intensive lifestyle modifications are the first line treatment for this issue. Dietary changes: Increase soluble fiber. Decrease simple carbohydrates. Exercise changes: Moderate to vigorous-intensity aerobic activity 150 minutes per week if tolerated. Lipid-lowering medications: see documented in medical record. Check labs today.  - Lipid panel  4. Hypothyroidism, unspecified type Patient with long-standing hypothyroidism, on levothyroxine therapy. She appears euthyroid. Orders and follow up as documented in patient record.  Counseling Good thyroid control is important for overall health. Supratherapeutic thyroid levels are dangerous and will not improve weight loss results. The correct way to take levothyroxine is fasting, with water, separated by at least 30 minutes from breakfast, and separated by more than 4 hours from calcium, iron, multivitamins, acid reflux medications (PPIs).  Check labs today.  - Thyroid Panel With TSH  5. At risk for heart disease Tricia Miranda was given approximately 15 minutes of coronary artery disease prevention counseling today. She is 56 y.o. female and has risk factors for heart disease including obesity. We discussed intensive lifestyle modifications today with an emphasis on specific weight loss instructions and strategies.   Repetitive spaced learning was employed today to elicit superior memory formation and behavioral change.  6. Obesity with current BMI of 32.3  Tricia Miranda is currently in the action stage of change. As such, her goal is to continue with  weight loss efforts. She has agreed to the Category 3 Plan and keeping a food journal and adhering to recommended goals of 450-600 calories and 40  protein with supper.   Exercise goals:  As is  Behavioral modification strategies: meal planning and cooking strategies and planning for success.  Tricia Miranda has agreed to follow-up with our clinic in 2-3 weeks. She was informed of the importance of frequent follow-up visits to maximize her success with intensive lifestyle modifications for her multiple health conditions.   Tricia Miranda was informed we would discuss her lab results at her next visit unless there is a critical issue that needs to be addressed sooner. Tricia Miranda agreed to keep her next visit at the agreed upon time to discuss these results.  Objective:   Blood pressure 135/84, pulse 67, temperature 98.2 F (36.8 C), height 5\' 7"  (1.702 m), weight 205 lb (93 kg), last menstrual period 11/15/2018, SpO2 99 %. Body mass index is 32.11 kg/m.  General: Cooperative, alert, well developed, in no acute distress. HEENT: Conjunctivae and lids unremarkable. Cardiovascular: Regular rhythm.  Lungs: Normal work of breathing. Neurologic: No focal deficits.   Lab Results  Component Value Date   CREATININE 0.61 11/06/2020   BUN 14 11/06/2020   NA 140 11/06/2020   K 4.5 11/06/2020   CL 101 11/06/2020   CO2 24 11/06/2020   Lab Results  Component Value Date   ALT 17 11/06/2020   AST 15 11/06/2020   ALKPHOS 113 11/06/2020   BILITOT 0.4 11/06/2020   Lab Results  Component Value Date   HGBA1C 5.2 05/28/2020   HGBA1C 5.1 11/28/2019   HGBA1C 5.2 09/20/2014   HGBA1C 5.3 10/07/2010   Lab Results  Component Value Date   INSULIN 6.9 06/18/2020   Lab Results  Component Value Date   TSH 0.611 11/06/2020   Lab Results  Component Value Date   CHOL 265 (H) 05/01/2021   HDL 70 05/01/2021   LDLCALC 178 (H) 05/01/2021   TRIG 100 05/01/2021   CHOLHDL 3.8 05/01/2021   Lab Results  Component Value Date   VD25OH 40.1 11/06/2020   VD25OH 43 05/28/2020   VD25OH 29 (L) 05/02/2019   Lab Results  Component Value Date   WBC 6.7 05/28/2020   HGB  14.1 05/28/2020   HCT 41.5 05/28/2020   MCV 83.0 05/28/2020   PLT 306 05/28/2020   Lab Results  Component Value Date   IRON 47 01/15/2017   TIBC 448 01/15/2017   FERRITIN 122 06/18/2020     Attestation Statements:   Reviewed by clinician on day of visit: allergies, medications, problem list, medical history, surgical history, family history, social history, and previous encounter notes.  Coral Ceo, CMA, am acting as transcriptionist for [employer], PA-C.  I have reviewed the above documentation for accuracy and completeness, and I agree with the above. Abby Potash, PA-C

## 2021-05-19 ENCOUNTER — Ambulatory Visit (INDEPENDENT_AMBULATORY_CARE_PROVIDER_SITE_OTHER): Payer: No Typology Code available for payment source | Admitting: Family Medicine

## 2021-05-19 ENCOUNTER — Other Ambulatory Visit: Payer: Self-pay

## 2021-05-19 ENCOUNTER — Encounter (INDEPENDENT_AMBULATORY_CARE_PROVIDER_SITE_OTHER): Payer: Self-pay | Admitting: Family Medicine

## 2021-05-19 VITALS — BP 116/81 | HR 73 | Temp 98.0°F | Ht 67.0 in | Wt 205.0 lb

## 2021-05-19 DIAGNOSIS — E8881 Metabolic syndrome: Secondary | ICD-10-CM | POA: Diagnosis not present

## 2021-05-19 DIAGNOSIS — E559 Vitamin D deficiency, unspecified: Secondary | ICD-10-CM

## 2021-05-19 DIAGNOSIS — E669 Obesity, unspecified: Secondary | ICD-10-CM | POA: Diagnosis not present

## 2021-05-19 DIAGNOSIS — E7849 Other hyperlipidemia: Secondary | ICD-10-CM | POA: Diagnosis not present

## 2021-05-19 DIAGNOSIS — Z6832 Body mass index (BMI) 32.0-32.9, adult: Secondary | ICD-10-CM

## 2021-05-19 MED ORDER — VITAMIN D (ERGOCALCIFEROL) 1.25 MG (50000 UNIT) PO CAPS: 50000.0000 [IU] | ORAL_CAPSULE | ORAL | 0 refills | Status: DC

## 2021-05-19 NOTE — Patient Instructions (Signed)
The 10-year ASCVD risk score (Arnett DK, et al., 2019) is: 4.3%   Values used to calculate the score:     Age: 56 years     Sex: Female     Is Non-Hispanic African American: No     Diabetic: No     Tobacco smoker: Yes     Systolic Blood Pressure: 446 mmHg     Is BP treated: No     HDL Cholesterol: 70 mg/dL     Total Cholesterol: 265 mg/dL

## 2021-05-20 ENCOUNTER — Encounter (INDEPENDENT_AMBULATORY_CARE_PROVIDER_SITE_OTHER): Payer: Self-pay

## 2021-05-20 NOTE — Progress Notes (Signed)
Chief Complaint:   OBESITY Tricia Miranda is here to discuss her progress with her obesity treatment plan along with follow-up of her obesity related diagnoses. Tricia Miranda is on the Category 3 Plan and states she is following her eating plan approximately 90% of the time. Tricia Miranda states she is doing Pilates and swimming 45-60 minutes 2-3 times per week.  Today's visit was #: 57 Starting weight: 224 lbs Starting date: 06/18/2020 Today's weight: 205 lbs Today's date: 05/19/2021 Total lbs lost to date: 19 Total lbs lost since last in-office visit: 0  Interim History: Pt of Dr Saul Fordyce.  She was last seen by Linus Orn, PA and per pt, labs were obtained and she is here to discuss, but they are not resulted in her chart.  It appears labs from our office were not processed even though a lab from another medical provider's office was.    Pt has no issues with plan. Didn't lose but didn't gain today. She denies hunger or cravings today.  Lexxie inquired as to why certain labs were ordered by Linus Orn at her last OV.    Subjective:   1. Insulin resistance Discussed old lab ( of FI) with the patient today.  Initial fasting insulin above 5.0.  - it was 6.9 on 06/18/20.       Thus, I discussed with pt that recent A1c and fasting insulin were likely ordered by Linus Orn PA, due to Tricia Miranda's h/o insulin resistance. Which, this is new information to pt today.  2. Vitamin D deficiency She is currently taking prescription vitamin D 50,000 IU each week. She denies nausea, vomiting or muscle weakness.  3. Other hyperlipidemia Pt reports that she was seen by cardiology for her cholesterol and her coronary calcium score was normal at that time. Statins caused joint pain with pt in the past.    Assessment/Plan:   Medications Discontinued During This Encounter  Medication Reason   Vitamin D, Ergocalciferol, (DRISDOL) 1.25 MG (50000 UNIT) CAPS capsule Reorder     Meds ordered this encounter  Medications   Vitamin D,  Ergocalciferol, (DRISDOL) 1.25 MG (50000 UNIT) CAPS capsule    Sig: Take 1 capsule (50,000 Units total) by mouth once a week.    Dispense:  4 capsule    Refill:  0     1. Insulin resistance Explained normal fasting insulin for our specialty of bariatric medicine is <5.0.   - I counseled patient on pathophysiology of the disease process of I.R. - Stressed importance of dietary and lifestyle modifications to result in weight loss as first line txmnt - Recommend she continue to decrease simple carbs; increase fiber and proteins; follow meal plan  - handouts provided after education provided.   - All concerns/questions addressed.   - anticipatory guidance given.   - provider will need to review lab results with pt at next OV    2. Vitamin D deficiency Low Vitamin D level contributes to fatigue and are associated with obesity, breast, and colon cancer. She agrees to continue to take prescription Vitamin D @50 ,000 IU every week and will follow-up for routine testing of Vitamin D, at least 2-3 times per year to avoid over-replacement.  Refill- Vitamin D, Ergocalciferol, (DRISDOL) 1.25 MG (50000 UNIT) CAPS capsule; Take 1 capsule (50,000 Units total) by mouth once a week.  Dispense: 4 capsule; Refill: 0    3. Other hyperlipidemia Pt was told to f/u with cardiology regarding FLP as she is in a "grey area" regarding whether or not  she needs a medication for disease management.  - Since Dr Debara Pickett recently obtained [place], I recommend pt discuss treatment plan with her cardiologist.   - I recommend pt only follow instructions of her cardiologist in regards to medication management of her HLD - follow our low saturated and trans fat / low cholesterol meal plan - Rec: aerobic activity with eventual goal of a minimum of 150+ min wk plus 2 days/ week of resistance strength training  We discussed several lifestyle modifications today and Zlaty will continue to work on diet, exercise and weight loss  efforts.      4. Obesity with current BMI of 32.2 Tricia Miranda is currently in the action stage of change. As such, her goal is to continue with weight loss efforts. She has agreed to the Category 3 Plan.   Pt advised labs will be reviewed at next OV with PA Linus Orn Olen Cordial will let pt know later on today why lab results are not present from [place] in EMR yet).  Exercise goals:  As is  Behavioral modification strategies: increasing lean protein intake, decreasing simple carbohydrates, and planning for success.  Sadee has agreed to follow-up with our clinic in 2 weeks with PA Tracey. She was informed of the importance of frequent follow-up visits to maximize her success with intensive lifestyle modifications for her multiple health conditions.     Objective:   Blood pressure 116/81, pulse 73, temperature 98 F (36.7 C), height 5\' 7"  (1.702 m), weight 205 lb (93 kg), last menstrual period 11/15/2018, SpO2 99 %. Body mass index is 32.11 kg/m.  General: Cooperative, alert, well developed, in no acute distress. HEENT: Conjunctivae and lids unremarkable. Cardiovascular: Regular rhythm.  Lungs: Normal work of breathing. Neurologic: No focal deficits.   Lab Results  Component Value Date   CREATININE 0.61 11/06/2020   BUN 14 11/06/2020   NA 140 11/06/2020   K 4.5 11/06/2020   CL 101 11/06/2020   CO2 24 11/06/2020   Lab Results  Component Value Date   ALT 17 11/06/2020   AST 15 11/06/2020   ALKPHOS 113 11/06/2020   BILITOT 0.4 11/06/2020   Lab Results  Component Value Date   HGBA1C 5.2 05/28/2020   HGBA1C 5.1 11/28/2019   HGBA1C 5.2 09/20/2014   HGBA1C 5.3 10/07/2010   Lab Results  Component Value Date   INSULIN 6.9 06/18/2020   Lab Results  Component Value Date   TSH 0.611 11/06/2020   Lab Results  Component Value Date   CHOL 265 (H) 05/01/2021   HDL 70 05/01/2021   LDLCALC 178 (H) 05/01/2021   TRIG 100 05/01/2021   CHOLHDL 3.8 05/01/2021   Lab Results  Component  Value Date   VD25OH 40.1 11/06/2020   VD25OH 43 05/28/2020   VD25OH 29 (L) 05/02/2019   Lab Results  Component Value Date   WBC 6.7 05/28/2020   HGB 14.1 05/28/2020   HCT 41.5 05/28/2020   MCV 83.0 05/28/2020   PLT 306 05/28/2020   Lab Results  Component Value Date   IRON 47 01/15/2017   TIBC 448 01/15/2017   FERRITIN 122 06/18/2020   Attestation Statements:   Reviewed by clinician on day of visit: allergies, medications, problem list, medical history, surgical history, family history, social history, and previous encounter notes.  40 minutes was spent today, including pre and post care, in discussing this pt's medical issues and treatments plans  I, Kathlene November, CMA, am acting as transcriptionist for [employer], DO.  I  have reviewed the above documentation for accuracy and completeness, and I agree with the above. Marjory Sneddon, D.O.  The [place] was signed into law in 2016 which includes the topic of electronic health records.  This provides immediate access to information in MyChart.  This includes consultation notes, operative notes, office notes, lab results and pathology reports.  If you have any questions about what you read please let us know at your next visit so we can discuss your concerns and take corrective action if need be.  We are right here with you.

## 2021-05-21 LAB — COMPREHENSIVE METABOLIC PANEL
ALT: 15 IU/L (ref 0–32)
AST: 16 IU/L (ref 0–40)
Albumin/Globulin Ratio: 1.8 (ref 1.2–2.2)
Albumin: 4.7 g/dL (ref 3.8–4.9)
Alkaline Phosphatase: 101 IU/L (ref 44–121)
BUN/Creatinine Ratio: 27 — ABNORMAL HIGH (ref 9–23)
BUN: 15 mg/dL (ref 6–24)
Bilirubin Total: 0.4 mg/dL (ref 0.0–1.2)
CO2: 24 mmol/L (ref 20–29)
Calcium: 9.7 mg/dL (ref 8.7–10.2)
Chloride: 99 mmol/L (ref 96–106)
Creatinine, Ser: 0.55 mg/dL — ABNORMAL LOW (ref 0.57–1.00)
Globulin, Total: 2.6 g/dL (ref 1.5–4.5)
Glucose: 80 mg/dL (ref 70–99)
Potassium: 4.3 mmol/L (ref 3.5–5.2)
Sodium: 138 mmol/L (ref 134–144)
Total Protein: 7.3 g/dL (ref 6.0–8.5)
eGFR: 108 mL/min/{1.73_m2} (ref >59)

## 2021-05-21 LAB — HEMOGLOBIN A1C
Est. average glucose Bld gHb Est-mCnc: 100 mg/dL
Hgb A1c MFr Bld: 5.1 % (ref 4.8–5.6)

## 2021-05-21 LAB — THYROID PANEL WITH TSH
Free Thyroxine Index: 4 (ref 1.2–4.9)
T3 Uptake Ratio: 33 % (ref 24–39)
T4, Total: 12 ug/dL (ref 4.5–12.0)
TSH: 0.093 u[IU]/mL — ABNORMAL LOW (ref 0.450–4.500)

## 2021-05-21 LAB — INSULIN, RANDOM: INSULIN: 9.2 u[IU]/mL (ref 2.6–24.9)

## 2021-05-21 LAB — VITAMIN D 25 HYDROXY (VIT D DEFICIENCY, FRACTURES): Vit D, 25-Hydroxy: 46.1 ng/mL (ref 30.0–100.0)

## 2021-05-30 ENCOUNTER — Encounter: Payer: Self-pay | Admitting: Internal Medicine

## 2021-05-30 ENCOUNTER — Ambulatory Visit (INDEPENDENT_AMBULATORY_CARE_PROVIDER_SITE_OTHER): Payer: No Typology Code available for payment source | Admitting: Internal Medicine

## 2021-05-30 ENCOUNTER — Other Ambulatory Visit: Payer: Self-pay

## 2021-05-30 VITALS — BP 122/80 | HR 75 | Ht 67.0 in | Wt 209.4 lb

## 2021-05-30 DIAGNOSIS — Z8342 Family history of familial hypercholesterolemia: Secondary | ICD-10-CM | POA: Diagnosis not present

## 2021-05-30 DIAGNOSIS — E785 Hyperlipidemia, unspecified: Secondary | ICD-10-CM | POA: Diagnosis not present

## 2021-05-30 DIAGNOSIS — M791 Myalgia, unspecified site: Secondary | ICD-10-CM | POA: Diagnosis not present

## 2021-05-30 DIAGNOSIS — T466X5A Adverse effect of antihyperlipidemic and antiarteriosclerotic drugs, initial encounter: Secondary | ICD-10-CM

## 2021-05-30 MED ORDER — EZETIMIBE 10 MG PO TABS: 10.0000 mg | ORAL_TABLET | Freq: Every day | ORAL | 3 refills | Status: DC

## 2021-05-30 NOTE — Patient Instructions (Signed)
Medication Instructions:  START zetia 10mg  daily   *If you need a refill on your cardiac medications before your next appointment, please call your pharmacy*   Lab Work: FASTING lab work in 3-4 months to check cholesterol   If you have labs (blood work) drawn today and your tests are completely normal, you will receive your results only by: [place] (if you have MyChart) OR A paper copy in the mail If you have any lab test that is abnormal or we need to change your treatment, we will call you to review the results.   Testing/Procedures: NONE   Follow-Up: At [facility], you and your health needs are our priority.  As part of our continuing mission to provide you with exceptional heart care, we have created designated Provider Care Teams.  These Care Teams include your primary Cardiologist (physician) and Advanced Practice Providers (APPs -  Physician Assistants and Nurse Practitioners) who all work together to provide you with the care you need, when you need it.  We recommend signing up for the patient portal called "MyChart".  Sign up information is provided on this After Visit Summary.  MyChart is used to connect with patients for Virtual Visits (Telemedicine).  Patients are able to view lab/test results, encounter notes, upcoming appointments, etc.  Non-urgent messages can be sent to your provider as well.   To learn more about what you can do with MyChart, go to [email or web address].    Your next appointment:   3-4 months (lipid clinic) with Dr. Debara Pickett

## 2021-05-30 NOTE — Progress Notes (Signed)
LIPID CLINIC CONSULT NOTE  Chief Complaint:  Follow-up dyslipidemia  Primary Care Physician: Elby Showers, MD  Primary Cardiologist:  None  HPI:  Tricia Miranda is a 56 y.o. female who is being seen today for the evaluation of dyslipidemia at the request of Baxley, Cresenciano Lick, MD. This is a pleasant 56 year old female kindly referred for evaluation management of dyslipidemia.  Her PCP had referred her due to progressively worsening dyslipidemia for management recommendations.  According to Tricia Miranda she has had longstanding issues with high cholesterol and had previously at one point been on simvastatin which she said caused significant joint aches and feeling of overall muscle soreness.  Subsequently in the past had been changed over to rosuvastatin 5 mg daily which she said she took for a number of years without any significant side effects but then started to develop more significant muscle and joint aches including feelings of muscle pulsations.  Her rosuvastatin was then decreased to a point where she was only on it twice weekly.  Her LDL a year ago was 115 but has since risen and currently is 141, total cholesterol 229, HDL 50 and triglycerides 249.  This data was 3 months ago and she reports that she has been off of her statin medication since that time so I expect that her cholesterol may be higher.  Fortunately, it appears that she has few other cardiovascular risk factors.  She does not have any diabetes, hypertension, known coronary disease or prior to cardiovascular events.  There is heart disease in her father who had two-vessel bypass but was a smoker and her mother reportedly had very high cholesterol she says in the 400s and was on medication for a while but then was told to come off of it after heart catheterization showed no significant coronary disease but subsequently then was restarted on it.  She reports a variable but generally low saturated fat diet with fish, chicken and  vegetables and other healthy sources.  06/19/2020  Tricia Miranda returns today for follow-up. She underwent coronary calcium scoring in November which showed 0 coronary calcium however she was noted to have hepatic steatosis. She has been working on weight loss and was referred to the [facility] health weight management center. She did have repeat lipids however they are slightly higher. Total cholesterol now 239, HDL 56, triglycerides 151 and LDL 155 (up from 143). She said is actually been a difficult couple of months having to care for her husband who injured his leg while fishing.  12/17/2020  Tricia Miranda is seen today in follow-up.  She has been working with [employer] management for weight loss.  She says she is down about 24 pounds.  Today weight was 213 pounds.  Despite this, her cholesterol has increased.  Total cholesterol was 251 in July with HDL 58, triglycerides 122 (this is reduced) and LDL 171.  She still feels that she is able to get her cholesterol lower with just diet alone.  She mentioned that her cholesterol was quite low back when she weighed 150 pounds a number of years ago.  Since she had no coronary artery calcium, it is felt that she is not at a high short-term risk of not being on medication.  05/30/2021  Tricia Miranda returns today for follow-up.  She is recently seen her primary care provider who had highly encouraged her to consider management of her lipids.  Her cholesterol was retested and the LDL is gone up from 1  71-1 78, total cholesterol 265, triglycerides 100 and HDL 70.  She had reported as previously mentioned that her mother had very high cholesterol but actually had no coronary disease by cath.  She may have a mutation in APO B which can sometimes be associated with lower cardiovascular risk.  Nonetheless I do suspect this is a genetic cholesterol disorder.  She is also recently been seeking treatment for ADHD and had undergone genetic and possibly pharmacogenetics testing which  is pending.  We discussed some other alternative therapies to the statins.   PMHx:  Past Medical History:  Diagnosis Date   Anxiety    Anxiety    Bronchitis    Cancer (HCC)    basal cell- tiny   Cancer ([place])    Fibroids    uterine    High cholesterol    Hypothyroidism    Joint pain    Low iron    Low vitamin D level    Osteoarthritis    Sleep apnea    uses oral device for mild sleep apnea    Thyroid disease    hypothyroidism   Vitamin B deficiency     Past Surgical History:  Procedure Laterality Date   ABDOMINAL HYSTERECTOMY     COLONOSCOPY     with benign  polyp removal    DILITATION & CURRETTAGE/HYSTROSCOPY WITH VERSAPOINT RESECTION N/A 03/16/2014   Procedure: DILATATION & CURETTAGE/HYSTEROSCOPY WITH VERSAPOINT RESECTION;  Surgeon: [name], MD;  Location: [facility];  Service: Gynecology;  Laterality: N/A;  1 hr.   HYSTERECTOMY ABDOMINAL WITH SALPINGECTOMY Bilateral 11/15/2018   Procedure: HYSTERECTOMY ABDOMINAL WITH  BILATERAL SALPINGECTOMY;  Surgeon: [name], MD;  Location: [facility];  Service: Gynecology;  Laterality: Bilateral;   MOHS SURGERY Left a while- unknown   under left eyelid   WISDOM TOOTH EXTRACTION      FAMHx:  Family History  Problem Relation Age of Onset   Hypertension Mother    High Cholesterol Mother    Sleep apnea Mother    Heart disease Father    Alcohol abuse Father    Anxiety disorder Father    Stomach cancer Other    Colon cancer Neg Hx    Esophageal cancer Neg Hx    Pancreatic cancer Neg Hx    Prostate cancer Neg Hx    Rectal cancer Neg Hx     SOCHx:   reports that she has never smoked. She has never used smokeless tobacco. She reports that she does not currently use alcohol. She reports that she does not use drugs.  ALLERGIES:  No Known Allergies  ROS: Pertinent items noted in HPI and remainder of comprehensive ROS otherwise negative.  HOME MEDS: Current Outpatient Medications on File  Prior to Visit  Medication Sig Dispense Refill   ALPRAZolam (XANAX) 0.25 MG tablet TAKE ONE TABLET BY MOUTH TWICE A DAY AS NEEDED 60 tablet 3   cetirizine (ZYRTEC) 10 MG tablet Take 10 mg by mouth daily as needed for allergies.     cyanocobalamin 1000 MCG tablet Take 1,000 mcg by mouth daily.     FLUoxetine (PROZAC) 10 MG tablet TAKE 1 TABLET BY MOUTH DAILY 90 tablet 1   SYNTHROID 112 MCG tablet TAKE ONE TABLET BY MOUTH EVERY MORNING BEFORE BREAKFAST 90 tablet 2   Vitamin D, Ergocalciferol, (DRISDOL) 1.25 MG (50000 UNIT) CAPS capsule Take 1 capsule (50,000 Units total) by mouth once a week. 4 capsule 0   ferrous sulfate 325 (65 FE) MG EC  tablet Take 325 mg by mouth as needed. (Patient not taking: Reported on 05/30/2021)     No current facility-administered medications on file prior to visit.    LABS/IMAGING: No results found for this or any previous visit (from the past 48 hour(s)).  No results found.  LIPID PANEL:    Component Value Date/Time   CHOL 265 (H) 05/01/2021 1513   TRIG 100 05/01/2021 1513   HDL 70 05/01/2021 1513   CHOLHDL 3.8 05/01/2021 1513   CHOLHDL 4.3 05/28/2020 1145   VLDL 26 11/08/2015 1026   LDLCALC 178 (H) 05/01/2021 1513   LDLCALC 155 (H) 05/28/2020 1145    WEIGHTS: Wt Readings from Last 3 Encounters:  05/30/21 209 lb 6.4 oz (95 kg)  05/19/21 205 lb (93 kg)  05/01/21 205 lb (93 kg)    VITALS: BP 122/80 (BP Location: Left Arm, Patient Position: Sitting, Cuff Size: Large)    Pulse 75    Ht 5\' 7"  (1.702 m)    Wt 209 lb 6.4 oz (95 kg)    LMP 11/15/2018    SpO2 98%    BMI 32.80 kg/m   EXAM: Deferred  EKG: Deferred  ASSESSMENT: Mixed dyslipidemia Statin intolerance - myalgias Family history of high cholesterol Moderate obesity Zero CAC (02/2020)-hepatic steatosis  PLAN: 1.   Ms. Miranda is interested in trying other options to lower cholesterol.  She has some hepatic steatosis but no coronary calcium based on her calcium score.  She has never had  elevated liver enzymes.  We discussed the possibility of trying ezetimibe.  Although this is less than effective to get her probably to an LDL less than 100, will have some improvement and hopefully will be well-tolerated.  Plan repeat lipids in about 3 months or sooner as necessary.  Ultimately we could also consider adding bempedoic acid.  Pixie Casino, MD, [facility], [place] Director of the Advanced Lipid Disorders &  Cardiovascular Risk Reduction Clinic Diplomate of the American Board of Clinical Lipidology Attending Cardiologist  Direct Dial: [phone]   Fax: [phone]  Website:  [email or web address] 05/30/2021, 2:43 PM

## 2021-06-02 ENCOUNTER — Other Ambulatory Visit: Payer: Self-pay

## 2021-06-02 ENCOUNTER — Ambulatory Visit (INDEPENDENT_AMBULATORY_CARE_PROVIDER_SITE_OTHER): Payer: No Typology Code available for payment source | Admitting: Physician Assistant

## 2021-06-02 ENCOUNTER — Encounter (INDEPENDENT_AMBULATORY_CARE_PROVIDER_SITE_OTHER): Payer: Self-pay | Admitting: Physician Assistant

## 2021-06-02 ENCOUNTER — Telehealth: Payer: No Typology Code available for payment source | Admitting: Internal Medicine

## 2021-06-02 ENCOUNTER — Telehealth: Payer: Self-pay | Admitting: Internal Medicine

## 2021-06-02 VITALS — BP 131/86 | HR 69 | Temp 98.0°F | Ht 67.0 in | Wt 205.0 lb

## 2021-06-02 DIAGNOSIS — E66812 Obesity, class 2: Secondary | ICD-10-CM

## 2021-06-02 DIAGNOSIS — E559 Vitamin D deficiency, unspecified: Secondary | ICD-10-CM | POA: Diagnosis not present

## 2021-06-02 DIAGNOSIS — E88819 Insulin resistance, unspecified: Secondary | ICD-10-CM

## 2021-06-02 DIAGNOSIS — Z9189 Other specified personal risk factors, not elsewhere classified: Secondary | ICD-10-CM | POA: Diagnosis not present

## 2021-06-02 DIAGNOSIS — Z6832 Body mass index (BMI) 32.0-32.9, adult: Secondary | ICD-10-CM | POA: Diagnosis not present

## 2021-06-02 DIAGNOSIS — E8881 Metabolic syndrome: Secondary | ICD-10-CM | POA: Diagnosis not present

## 2021-06-02 DIAGNOSIS — E669 Obesity, unspecified: Secondary | ICD-10-CM

## 2021-06-02 MED ORDER — VITAMIN D (ERGOCALCIFEROL) 1.25 MG (50000 UNIT) PO CAPS: 50000.0000 [IU] | ORAL_CAPSULE | ORAL | 0 refills | Status: DC

## 2021-06-02 NOTE — Telephone Encounter (Signed)
Tricia Miranda [phone]  Tricia Miranda called to say she had recent labs in January, so she was wandering if she still needed to come in for them tomorrow.

## 2021-06-02 NOTE — Progress Notes (Signed)
Chief Complaint:   OBESITY Tricia Miranda is here to discuss her progress with her obesity treatment plan along with follow-up of her obesity related diagnoses. Tricia Miranda is on the Category 3 Plan and states she is following her eating plan approximately 95% of the time. Tricia Miranda states she is walking and doing Pilates 50 minutes 2-3 times per week.  Today's visit was #: 63 Starting weight: 224 lbs Starting date: 06/18/2020 Today's weight: 205 lbs Today's date: 06/02/2021 Total lbs lost to date: 19 Total lbs lost since last in-office visit: 0  Interim History: Pt is eating an egg and [place] bacon/sausage sandwich for breakfast 4-5 days of the week and a frozen meal for lunch. She is not always weighing her protein but is buying it in 10-11 oz portions. Her hunger has decreased.  Subjective:   1. Vitamin D deficiency Discussed labs with patient today. Pt's last Vit D level was 46.1 and she is on Vit D weekly.  2. Insulin resistance Discussed labs with patient today. Pt's insulin level is not oat goal and she is not on meds.  3. At risk for osteoporosis Tricia Miranda is at higher risk of osteopenia and osteoporosis due to Vitamin D deficiency.   Assessment/Plan:   1. Vitamin D deficiency Low Vitamin D level contributes to fatigue and are associated with obesity, breast, and colon cancer. She agrees to continue to take prescription Vitamin D 50,000 IU every week and will follow-up for routine testing of Vitamin D, at least 2-3 times per year to avoid over-replacement.  Refill- Vitamin D, Ergocalciferol, (DRISDOL) 1.25 MG (50000 UNIT) CAPS capsule; Take 1 capsule (50,000 Units total) by mouth once a week.  Dispense: 4 capsule; Refill: 0  2. Insulin resistance Tricia Miranda will continue to work on weight loss, exercise, and decreasing simple carbohydrates to help decrease the risk of diabetes. Tricia Miranda agreed to follow-up with [place] as directed to closely monitor her progress. Continue with plan.  3. At risk for  osteoporosis Tricia Miranda was given approximately 15 minutes of osteoporosis prevention counseling today. Tricia Miranda is at risk for osteopenia and osteoporosis due to her Vitamin D deficiency. She was encouraged to take her Vit D and follow her higher calcium diet and increase strengthening exercise to help strengthen her bones and decrease her risk of osteopenia and osteoporosis.  4. Obesity with current BMI of 32.2 Tricia Miranda is currently in the action stage of change. As such, her goal is to continue with weight loss efforts. She has agreed to the Category 3 Plan.   IC at next visit.  Exercise goals:  Add strength training twice a week.  Behavioral modification strategies: meal planning and cooking strategies and planning for success.  Tricia Miranda has agreed to follow-up with our clinic in 2-3 weeks. She was informed of the importance of frequent follow-up visits to maximize her success with intensive lifestyle modifications for her multiple health conditions.   Objective:   Blood pressure 131/86, pulse 69, temperature 98 F (36.7 C), height 5\' 7"  (1.702 m), weight 205 lb (93 kg), last menstrual period 11/15/2018, SpO2 98 %. Body mass index is 32.11 kg/m.  General: Cooperative, alert, well developed, in no acute distress. HEENT: Conjunctivae and lids unremarkable. Cardiovascular: Regular rhythm.  Lungs: Normal work of breathing. Neurologic: No focal deficits.   Lab Results  Component Value Date   CREATININE 0.55 (L) 05/20/2021   BUN 15 05/20/2021   NA 138 05/20/2021   K 4.3 05/20/2021   CL 99 05/20/2021   CO2  24 05/20/2021   Lab Results  Component Value Date   ALT 15 05/20/2021   AST 16 05/20/2021   ALKPHOS 101 05/20/2021   BILITOT 0.4 05/20/2021   Lab Results  Component Value Date   HGBA1C 5.1 05/20/2021   HGBA1C 5.2 05/28/2020   HGBA1C 5.1 11/28/2019   HGBA1C 5.2 09/20/2014   HGBA1C 5.3 10/07/2010   Lab Results  Component Value Date   INSULIN 9.2 05/20/2021   INSULIN 6.9  06/18/2020   Lab Results  Component Value Date   TSH 0.093 (L) 05/20/2021   Lab Results  Component Value Date   CHOL 265 (H) 05/01/2021   HDL 70 05/01/2021   LDLCALC 178 (H) 05/01/2021   TRIG 100 05/01/2021   CHOLHDL 3.8 05/01/2021   Lab Results  Component Value Date   VD25OH 46.1 05/20/2021   VD25OH 40.1 11/06/2020   VD25OH 43 05/28/2020   Lab Results  Component Value Date   WBC 6.7 05/28/2020   HGB 14.1 05/28/2020   HCT 41.5 05/28/2020   MCV 83.0 05/28/2020   PLT 306 05/28/2020   Lab Results  Component Value Date   IRON 47 01/15/2017   TIBC 448 01/15/2017   FERRITIN 122 06/18/2020   Attestation Statements:   Reviewed by clinician on day of visit: allergies, medications, problem list, medical history, surgical history, family history, social history, and previous encounter notes.  Coral Ceo, CMA, am acting as transcriptionist for [employer], PA-C.  I have reviewed the above documentation for accuracy and completeness, and I agree with the above. Abby Potash, PA-C

## 2021-06-02 NOTE — Telephone Encounter (Signed)
LVM that we still need 2 abs CBC and B12 for her CPE

## 2021-06-03 ENCOUNTER — Other Ambulatory Visit: Payer: No Typology Code available for payment source | Admitting: Internal Medicine

## 2021-06-03 DIAGNOSIS — E538 Deficiency of other specified B group vitamins: Secondary | ICD-10-CM

## 2021-06-03 DIAGNOSIS — F419 Anxiety disorder, unspecified: Secondary | ICD-10-CM

## 2021-06-03 LAB — CBC WITH DIFFERENTIAL/PLATELET
Absolute Monocytes: 342 cells/uL (ref 200–950)
Basophils Absolute: 39 cells/uL (ref 0–200)
Basophils Relative: 0.7 %
Eosinophils Absolute: 129 cells/uL (ref 15–500)
Eosinophils Relative: 2.3 %
HCT: 42.5 % (ref 35.0–45.0)
Hemoglobin: 13.8 g/dL (ref 11.7–15.5)
Lymphs Abs: 2212 cells/uL (ref 850–3900)
MCH: 27 pg (ref 27.0–33.0)
MCHC: 32.5 g/dL (ref 32.0–36.0)
MCV: 83.2 fL (ref 80.0–100.0)
MPV: 10.4 fL (ref 7.5–12.5)
Monocytes Relative: 6.1 %
Neutro Abs: 2878 cells/uL (ref 1500–7800)
Neutrophils Relative %: 51.4 %
Platelets: 259 10*3/uL (ref 140–400)
RBC: 5.11 10*6/uL — ABNORMAL HIGH (ref 3.80–5.10)
RDW: 12.3 % (ref 11.0–15.0)
Total Lymphocyte: 39.5 %
WBC: 5.6 10*3/uL (ref 3.8–10.8)

## 2021-06-03 LAB — VITAMIN B12: Vitamin B-12: 724 pg/mL (ref 200–1100)

## 2021-06-05 ENCOUNTER — Other Ambulatory Visit: Payer: Self-pay

## 2021-06-05 ENCOUNTER — Ambulatory Visit (INDEPENDENT_AMBULATORY_CARE_PROVIDER_SITE_OTHER): Payer: No Typology Code available for payment source | Admitting: Internal Medicine

## 2021-06-05 ENCOUNTER — Encounter: Payer: Self-pay | Admitting: Internal Medicine

## 2021-06-05 VITALS — BP 128/72 | HR 71 | Temp 97.6°F | Ht 66.0 in | Wt 209.0 lb

## 2021-06-05 DIAGNOSIS — M170 Bilateral primary osteoarthritis of knee: Secondary | ICD-10-CM

## 2021-06-05 DIAGNOSIS — R82998 Other abnormal findings in urine: Secondary | ICD-10-CM | POA: Diagnosis not present

## 2021-06-05 DIAGNOSIS — E039 Hypothyroidism, unspecified: Secondary | ICD-10-CM

## 2021-06-05 DIAGNOSIS — G4733 Obstructive sleep apnea (adult) (pediatric): Secondary | ICD-10-CM

## 2021-06-05 DIAGNOSIS — Z0000 Encounter for general adult medical examination without abnormal findings: Secondary | ICD-10-CM | POA: Diagnosis not present

## 2021-06-05 DIAGNOSIS — R7989 Other specified abnormal findings of blood chemistry: Secondary | ICD-10-CM | POA: Diagnosis not present

## 2021-06-05 DIAGNOSIS — R319 Hematuria, unspecified: Secondary | ICD-10-CM | POA: Diagnosis not present

## 2021-06-05 DIAGNOSIS — R5381 Other malaise: Secondary | ICD-10-CM

## 2021-06-05 DIAGNOSIS — E7800 Pure hypercholesterolemia, unspecified: Secondary | ICD-10-CM

## 2021-06-05 LAB — POCT URINALYSIS DIPSTICK
Bilirubin, UA: NEGATIVE
Glucose, UA: NEGATIVE
Ketones, UA: NEGATIVE
Nitrite, UA: NEGATIVE
Protein, UA: NEGATIVE
Spec Grav, UA: 1.015 (ref 1.010–1.025)
Urobilinogen, UA: 0.2 E.U./dL
pH, UA: 6 (ref 5.0–8.0)

## 2021-06-05 NOTE — Progress Notes (Signed)
Subjective:    Patient ID: Tricia Miranda, female    DOB: May 12, 1965, 56 y.o.   MRN: [id number]  HPI 56 year old Female seen for health maintenance exam and evaluation of medical issues.  She has a history of vitamin D deficiency.  Has been attending [facility] and is getting vitamin D supplementation.  Has bilateral osteoarthritis of both knees and has had both knees injected in November 2022 by Dr. August Saucer.  She saw Dr. Rennis Golden in early February regarding dyslipidemia.  Developed myalgias with statin medications.  He felt she had few cardiovascular risk factors.  Family history of father who had two-vessel bypass but was a smoker.  Reportedly mother had cholesterol in her 400s but had coronary catheterization that was nonsignificant.  Patient tries to eat healthy.  She had coronary calcium score and the result was 0.  She was noted to have fatty liver.  Lipid panel in January showed total cholesterol of 265 with an LDL cholesterol of 178.  Triglycerides were normal at 100 and HDL cholesterol was excellent at 70.  Zetia was discussed by Dr. Rennis Golden and was prescribed on February 10.  Patient feels she has not been on this long enough to have lipids rechecked at this point.  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  She has 1 son.  Does not smoke.  She and her husband are separated at this time.  Family history: Father with history of MI and alcoholism.  Mother in good health.  1 sister with history of hip dysplasia.  Maternal grandmother with history of adult onset diabetes.  She has a history of developing a fine tremor while living in [place] a number of years ago.  MRI of the brain and nerve conduction testing were performed and were normal.  Fine tremor never progressed.  Past medical history: Comminuted fracture right humerus October 2007.  Left fibula fracture March 2009.    Review of Systems Dr. Rennis Golden put her on Zetia a few days  ago.     Objective:   Physical Exam Blood pressure 128/72 pulse 71 temperature 97.6 degrees pulse oximetry 98% weight 209 pounds BMI 33.73  Skin: Warm and dry.  No cervical adenopathy.  TMs and pharynx are clear.  Neck is supple.  No thyromegaly chest clear to auscultation.  Cardiac exam: Regular rate and rhythm.  Abdomen soft nondistended without hepatosplenomegaly masses or tenderness.  No lower extremity pitting edema.  Brief neurological exam intact without focal deficits.       Assessment & Plan:  BMI 33.73-attending [facility].  BMI in 2022 was 35.87 so she has made excellent progress in weight loss  Bilateral osteoarthritis of both knees-is seeing Dr. August Saucer for knee injections  Hyperlipidemia-being followed by Dr. Rennis Golden  History of anxiety treated with Xanax for a number of years  Very fine resting tremor longstanding which is not very noticeable  History of vitamin D deficiency-is on high-dose vitamin D supplement  Status post hysterectomy BSO  History of COVID-08 May 2020  Hypothyroidism treated with thyroid replacement medication.  Her TSH is low at 0.23 and was also low in late January at 0.093.  She may need her dose reduced.  She prefers to have this rechecked next month.  Abnormal urine dipstick-culture revealed less than 10,000 CFU per mL single gram-positive organism and she is asymptomatic and will not be treated  Plan: Follow-up in March for repeat TSH and  office visit.

## 2021-06-06 LAB — URINE CULTURE
MICRO NUMBER:: 13018522
SPECIMEN QUALITY:: ADEQUATE

## 2021-06-06 LAB — TSH: TSH: 0.23 mIU/L — ABNORMAL LOW

## 2021-06-06 MED ORDER — SYNTHROID 100 MCG PO TABS: 100.0000 ug | ORAL_TABLET | Freq: Every day | ORAL | 1 refills | Status: DC

## 2021-06-30 ENCOUNTER — Ambulatory Visit (INDEPENDENT_AMBULATORY_CARE_PROVIDER_SITE_OTHER): Payer: No Typology Code available for payment source | Admitting: Family Medicine

## 2021-06-30 ENCOUNTER — Encounter (INDEPENDENT_AMBULATORY_CARE_PROVIDER_SITE_OTHER): Payer: Self-pay | Admitting: Family Medicine

## 2021-06-30 ENCOUNTER — Other Ambulatory Visit: Payer: Self-pay

## 2021-06-30 VITALS — BP 138/86 | HR 67 | Temp 98.3°F | Ht 67.0 in | Wt 204.0 lb

## 2021-06-30 DIAGNOSIS — E559 Vitamin D deficiency, unspecified: Secondary | ICD-10-CM | POA: Diagnosis not present

## 2021-06-30 DIAGNOSIS — E038 Other specified hypothyroidism: Secondary | ICD-10-CM | POA: Diagnosis not present

## 2021-06-30 DIAGNOSIS — Z6835 Body mass index (BMI) 35.0-35.9, adult: Secondary | ICD-10-CM

## 2021-06-30 DIAGNOSIS — Z9189 Other specified personal risk factors, not elsewhere classified: Secondary | ICD-10-CM

## 2021-06-30 DIAGNOSIS — F908 Attention-deficit hyperactivity disorder, other type: Secondary | ICD-10-CM

## 2021-06-30 DIAGNOSIS — E669 Obesity, unspecified: Secondary | ICD-10-CM | POA: Diagnosis not present

## 2021-06-30 MED ORDER — VITAMIN D (ERGOCALCIFEROL) 1.25 MG (50000 UNIT) PO CAPS: 50000.0000 [IU] | ORAL_CAPSULE | ORAL | 0 refills | Status: DC

## 2021-07-01 NOTE — Progress Notes (Signed)
? ? ? ?Chief Complaint:  ? ?OBESITY ?Tricia Miranda is here to discuss her progress with her obesity treatment plan along with follow-up of her obesity related diagnoses. Tricia Miranda is on the Category 3 Plan and states she is following her eating plan approximately 95% of the time. Tricia Miranda states she is doing pilates and walking for 50-60 minutes 3-4 times per week. ? ?Today's visit was #: 19 ?Starting weight: 224 lbs ?Starting date: 06/18/2020 ?Today's weight: 204 lbs ?Today's date: 06/30/2021 ?Total lbs lost to date: 20 lbs ?Total lbs lost since last in-office visit: 1 lb ? ?Interim History:  ?- Tricia Miranda came in late today and we were unable to perform an IC as planned.   ? ?- She is measuring protein better now  - getting 8-10 oz now at dinner.  Breakfast - eating on plan.  Lunch - salad when eating out.  Dinner - [employer] or [employer] bowl.  She has less hunger overall and no carb cravings.  Doing excellent with exercise. ? ? ? ?Subjective:  ? ?1. Vitamin D deficiency ?She is currently taking prescription vitamin D 50,000 IU each week. She denies nausea, vomiting or muscle weakness. ? ?2. Other specified hypothyroidism ?Discussed labs with patient today. TSH was 0.23 on 06/05/2021. ?PCP recently decreased her dose of Synthroid to 100 mcg and will recheck her TSH in 6 weeks or so.  She is currently asymptomatic. ? ?3. Attention deficit hyperactivity disorder (ADHD), other type ?Tricia Miranda went to [facility] for medication management.  She had some specialized testing there and has a few questions about the results. ? ?4. At risk for impaired metabolic function ?Tricia Miranda is at increased risk for impaired metabolic function due to IR and hypothyroidism.  ? ? ? ?Assessment/Plan:  ? ?Medications Discontinued During This Encounter  ?Medication Reason  ? Vitamin D, Ergocalciferol, (DRISDOL) 1.25 MG (50000 UNIT) CAPS capsule Reorder  ?  ? ?Meds ordered this encounter  ?Medications  ? Vitamin D, Ergocalciferol, (DRISDOL) 1.25 MG  (50000 UNIT) CAPS capsule  ?  Sig: Take 1 capsule (50,000 Units total) by mouth once a week.  ?  Dispense:  4 capsule  ?  Refill:  0  ?  ? ?1. Vitamin D deficiency ?Low Vitamin D level contributes to fatigue and are associated with obesity, breast, and colon cancer. She agrees to continue to take prescription Vitamin D '@50'$ ,000 IU every week and will follow-up for routine testing of Vitamin D, at least 2-3 times per year to avoid over-replacement. ? ?- Refill Vitamin D, Ergocalciferol, (DRISDOL) 1.25 MG (50000 UNIT) CAPS capsule; Take 1 capsule (50,000 Units total) by mouth once a week.  Dispense: 4 capsule; Refill: 0 ? ?2. Other specified hypothyroidism ?Will follow alongside specialists. ? ?3. Attention deficit hyperactivity disorder (ADHD), other type ?I recommend patient follow-up with [facility] about the testing and results.  If she needs additional screening, it can be added to her next lab draw with her PCP for her thyroid recheck. ? ?4. At risk for impaired metabolic function ?Tricia Miranda was given approximately 9 minutes of impaired metabolic function prevention counseling today. We discussed intensive lifestyle modifications today with an emphasis on specific nutrition strategies to achieve weight loss  ? ?Motivational interviewing was employed today to elicit behavioral change.  ? ?5. Obesity with current BMI of 32 ? ?Tricia Miranda is currently in the action stage of change. As such, her goal is to continue with weight loss efforts. She has agreed to the Category 3  Plan.  ? ?Exercise goals:  As is. ? ?Behavioral modification strategies: increasing lean protein intake, decreasing eating out, no skipping meals, and planning for success. ? ?Tricia Miranda has agreed to follow-up with our clinic in 3 weeks, fasting, 30 min prior for IC. She was informed of the importance of frequent follow-up visits to maximize her success with intensive lifestyle modifications for her multiple health conditions.   ? ? ?Objective:  ? ?Blood pressure 138/86, pulse 67, temperature 98.3 ?F (36.8 ?C), height '5\' 7"'$  (1.702 m), weight 204 lb (92.5 kg), last menstrual period 11/15/2018, SpO2 98 %. ?Body mass index is 31.95 kg/m?. ? ?General: Cooperative, alert, well developed, in no acute distress. ?HEENT: Conjunctivae and lids unremarkable. ?Cardiovascular: Regular rhythm.  ?Lungs: Normal work of breathing. ?Neurologic: No focal deficits.  ? ?Lab Results  ?Component Value Date  ? CREATININE 0.55 (L) 05/20/2021  ? BUN 15 05/20/2021  ? NA 138 05/20/2021  ? K 4.3 05/20/2021  ? CL 99 05/20/2021  ? CO2 24 05/20/2021  ? ?Lab Results  ?Component Value Date  ? ALT 15 05/20/2021  ? AST 16 05/20/2021  ? ALKPHOS 101 05/20/2021  ? BILITOT 0.4 05/20/2021  ? ?Lab Results  ?Component Value Date  ? HGBA1C 5.1 05/20/2021  ? HGBA1C 5.2 05/28/2020  ? HGBA1C 5.1 11/28/2019  ? HGBA1C 5.2 09/20/2014  ? HGBA1C 5.3 10/07/2010  ? ?Lab Results  ?Component Value Date  ? INSULIN 9.2 05/20/2021  ? INSULIN 6.9 06/18/2020  ? ?Lab Results  ?Component Value Date  ? TSH 0.23 (L) 06/05/2021  ? ?Lab Results  ?Component Value Date  ? CHOL 265 (H) 05/01/2021  ? HDL 70 05/01/2021  ? LDLCALC 178 (H) 05/01/2021  ? TRIG 100 05/01/2021  ? CHOLHDL 3.8 05/01/2021  ? ?Lab Results  ?Component Value Date  ? VD25OH 46.1 05/20/2021  ? VD25OH 40.1 11/06/2020  ? VD25OH 43 05/28/2020  ? ?Lab Results  ?Component Value Date  ? WBC 5.6 06/03/2021  ? HGB 13.8 06/03/2021  ? HCT 42.5 06/03/2021  ? MCV 83.2 06/03/2021  ? PLT 259 06/03/2021  ? ?Lab Results  ?Component Value Date  ? IRON 47 01/15/2017  ? TIBC 448 01/15/2017  ? FERRITIN 122 06/18/2020  ? ?Attestation Statements:  ? ?Reviewed by clinician on day of visit: allergies, medications, problem list, medical history, surgical history, family history, social history, and previous encounter notes. ? ?I, Water quality scientist, CMA, am acting as transcriptionist for [employer], DO. ? ?I have reviewed the above documentation for accuracy and  completeness, and I agree with the above. Marjory Sneddon, D.O. ? ?The [place] was signed into law in 2016 which includes the topic of electronic health records.  This provides immediate access to information in MyChart.  This includes consultation notes, operative notes, office notes, lab results and pathology reports.  If you have any questions about what you read please let us know at your next visit so we can discuss your concerns and take corrective action if need be.  We are right here with you. ? ?

## 2021-07-09 ENCOUNTER — Other Ambulatory Visit: Payer: Self-pay

## 2021-07-14 ENCOUNTER — Other Ambulatory Visit: Payer: No Typology Code available for payment source

## 2021-07-14 DIAGNOSIS — E038 Other specified hypothyroidism: Secondary | ICD-10-CM

## 2021-07-17 ENCOUNTER — Other Ambulatory Visit: Payer: No Typology Code available for payment source

## 2021-07-17 DIAGNOSIS — E038 Other specified hypothyroidism: Secondary | ICD-10-CM

## 2021-07-17 NOTE — Addendum Note (Signed)
Addended by: Angus Seller on: 07/17/2021 09:54 AM ? ? Modules accepted: Orders ? ?

## 2021-07-18 ENCOUNTER — Encounter: Payer: Self-pay | Admitting: Internal Medicine

## 2021-07-18 ENCOUNTER — Ambulatory Visit: Payer: No Typology Code available for payment source | Admitting: Internal Medicine

## 2021-07-18 VITALS — BP 138/88 | HR 64 | Temp 97.8°F | Ht 67.0 in | Wt 208.5 lb

## 2021-07-18 DIAGNOSIS — E039 Hypothyroidism, unspecified: Secondary | ICD-10-CM

## 2021-07-18 DIAGNOSIS — J029 Acute pharyngitis, unspecified: Secondary | ICD-10-CM

## 2021-07-18 DIAGNOSIS — H6591 Unspecified nonsuppurative otitis media, right ear: Secondary | ICD-10-CM | POA: Diagnosis not present

## 2021-07-18 LAB — TSH: TSH: 1.13 mIU/L

## 2021-07-18 MED ORDER — FLUCONAZOLE 150 MG PO TABS: 150.0000 mg | ORAL_TABLET | Freq: Once | ORAL | 0 refills | Status: AC

## 2021-07-18 MED ORDER — AZITHROMYCIN 250 MG PO TABS: ORAL_TABLET | ORAL | 0 refills | Status: DC

## 2021-07-18 NOTE — Progress Notes (Signed)
? ?  Subjective:  ? ? Patient ID: Tricia Miranda, female    DOB: 1966-04-02, 56 y.o.   MRN: [id number] ? ?HPI ? ? ? ?Review of Systems ? ?   ?Objective:  ? Physical Exam ? ? ? ? ?   ?Assessment & Plan:  ? ? ?

## 2021-07-18 NOTE — Progress Notes (Signed)
? ?  Subjective:  ? ? Patient ID: Tricia Miranda, female    DOB: 11-Jan-1966, 56 y.o.   MRN: [id number] ? ?HPI 56 year old Female seen for follow up on hypothyroidism.  At last visit in February her TSH was low at 0.23 and had been low in January 2023 at 0.093.  Synthroid was changed from 0.112 mcg daily to 0.100 mcg daily and TSH has normalized to an excellent level of 1.13.  She feels well. ? ?New complaint today is some right ear.  And scratchy throat.  This has been going on for several weeks. ? ?Continues to go to [employer]. ? ? ? ?Review of Systems no fever or chills.  Symptoms have been present for 4 to 6 weeks with ear and throat discomfort.  Son had similar illness and tested negative for COVID.  Patient has had multiple vaccines for COVID and has not had fever or significant cough. ? ?   ?Objective:  ? Physical Exam ?Blood pressure 138/88.  She should continue to monitor this at home.  Pulse 64.  Temperature 97.8 degrees pulse oximetry 98% weight 208 pounds 8 ounces BMI 32.66 in February 2022 she weighed 229 pounds and her BMI was 35.87.  She has done well with her weight loss and exercise program. ? ?Skin warm and dry.  No cervical adenopathy.  Her pharynx is slightly red with petechial type lesions.  Right TM is slightly full with splayed light reflex.  Left TM is completely normal with normal light reflex.  Neck is supple without adenopathy.  Chest is clear to auscultation.  No thyromegaly. ? ?   ?Assessment & Plan:  ?Hypothyroidism-TSH is now normal on lower dose of thyroid replacement medication we will follow-up in 6 months.  Continue Synthroid 100 mcg daily ? ?Right otitis media-present for several weeks ? ?Pharyngitis-has been present for several weeks ? ?Plan: Was given Zithromax Z-PAK 2 tabs day 1 followed by 1 tab days 2 through 5 and prescription for Diflucan 150 mg tablet.  Continue current dose of thyroid replacement Synthroid 100 mcg daily.  Had health maintenance exam in  February 2023 May return in August 2023 for 76-monthfollow-up. ? ? ? ?

## 2021-07-18 NOTE — Patient Instructions (Addendum)
TSH is now normal on Synthroid 0.100 mg daily(100 mcg daily).  Continue same dose. ? ?You have been diagnosed with an otitis media of the right ear and a pharyngitis.  Please take Zithromax Z-PAK as directed and Diflucan has been prescribed to prevent Candida vaginitis while on antibiotics. ? ?Return in August for 68-monthfollow-up. ?

## 2021-07-21 ENCOUNTER — Ambulatory Visit (INDEPENDENT_AMBULATORY_CARE_PROVIDER_SITE_OTHER): Payer: No Typology Code available for payment source | Admitting: Physician Assistant

## 2021-07-21 ENCOUNTER — Encounter (INDEPENDENT_AMBULATORY_CARE_PROVIDER_SITE_OTHER): Payer: Self-pay | Admitting: Physician Assistant

## 2021-07-21 VITALS — BP 113/72 | HR 58 | Temp 98.3°F | Ht 67.0 in | Wt 203.0 lb

## 2021-07-21 DIAGNOSIS — E559 Vitamin D deficiency, unspecified: Secondary | ICD-10-CM

## 2021-07-21 DIAGNOSIS — Z6831 Body mass index (BMI) 31.0-31.9, adult: Secondary | ICD-10-CM

## 2021-07-21 DIAGNOSIS — E669 Obesity, unspecified: Secondary | ICD-10-CM

## 2021-07-21 DIAGNOSIS — R0602 Shortness of breath: Secondary | ICD-10-CM | POA: Diagnosis not present

## 2021-07-21 DIAGNOSIS — Z9189 Other specified personal risk factors, not elsewhere classified: Secondary | ICD-10-CM | POA: Diagnosis not present

## 2021-07-21 DIAGNOSIS — Z6835 Body mass index (BMI) 35.0-35.9, adult: Secondary | ICD-10-CM

## 2021-07-22 NOTE — Progress Notes (Signed)
? ? ? ?Chief Complaint:  ? ?OBESITY ?Tricia Miranda is here to discuss her progress with her obesity treatment plan along with follow-up of her obesity related diagnoses. Tricia Miranda is on the Category 2 Plan and states she is following her eating plan approximately 85% of the time. Tricia Miranda states she is pilates for 50 minutes 2-3 times per week. ? ?Today's visit was #: 20 ?Starting weight: 224 lbs ?Starting date: 06/18/2020 ?Today's weight: 203 lbs ?Today's date: 07/21/2021 ?Total lbs lost to date: 21 lbs ?Total lbs lost since last in-office visit: 1 lb ? ?Interim History: Tricia Miranda has been feeling "queasy" since starting antibiotics for an ear infection 4 days ago. Otherwise she is following the plan well. We discussed strength training today.  ? ?Subjective:  ? ?1. SOBOE (shortness of breath on exertion) ?Tricia Miranda notes shortness of breath with exertion. She has dizziness recently but currently has an ear infection. Her IC was unchanged today. ? ?2. Vitamin D deficiency ?Tricia Miranda is on Vitamin D weekly and she is tolerating it well.  ? ?3. At risk for osteoporosis ?Tricia Miranda is at higher risk of osteopenia and osteoporosis due to Vitamin D deficiency.   ? ?Assessment/Plan:  ? ?1. SOBOE (shortness of breath on exertion) ?We will check IC today. She will continue with Category 3 due to unchanged IC. ? ?2. Vitamin D deficiency ?Tricia Miranda Vitamin D level contributes to fatigue and are associated with obesity, breast, and colon cancer. Tricia Miranda agrees to continue to take prescription Vitamin D 50,000 IU every week and she will follow-up for routine testing of Vitamin D, at least 2-3 times per year to avoid over-replacement. ? ?3. At risk for osteoporosis ?Tricia Miranda was given approximately 15 minutes of osteoporosis prevention counseling today. Tricia Miranda is at risk for osteopenia and osteoporosis due to her Vitamin D deficiency. She was encouraged to take her Vit D and follow her higher calcium diet and increase strengthening exercise to help strengthen her bones  and decrease her risk of osteopenia and osteoporosis.  ? ?4. Obesity with current BMI of 31.79 ?Tricia Miranda is currently in the action stage of change. As such, her goal is to continue with weight loss efforts. She has agreed to the Category 3 Plan.  ? ?Exercise goals:  As is. ? ?Behavioral modification strategies: meal planning and cooking strategies and better snacking choices. ? ?Tricia Miranda has agreed to follow-up with our clinic in 2 weeks. She was informed of the importance of frequent follow-up visits to maximize her success with intensive lifestyle modifications for her multiple health conditions.  ? ?Tricia Miranda was informed we would discuss her lab results at her next visit unless there is a critical issue that needs to be addressed sooner. Tricia Miranda agreed to keep her next visit at the agreed upon time to discuss these results. ? ?Objective:  ? ?Blood pressure 113/72, pulse (!) 58, temperature 98.3 ?F (36.8 ?C), height '5\' 7"'$  (1.702 m), weight 203 lb (92.1 kg), last menstrual period 11/15/2018, SpO2 95 %. ?Body mass index is 31.79 kg/m?. ? ?General: Cooperative, alert, well developed, in no acute distress. ?HEENT: Conjunctivae and lids unremarkable. ?Cardiovascular: Regular rhythm.  ?Lungs: Normal work of breathing. ?Neurologic: No focal deficits.  ? ?Lab Results  ?Component Value Date  ? CREATININE 0.55 (L) 05/20/2021  ? BUN 15 05/20/2021  ? NA 138 05/20/2021  ? K 4.3 05/20/2021  ? CL 99 05/20/2021  ? CO2 24 05/20/2021  ? ?Lab Results  ?Component Value Date  ? ALT 15 05/20/2021  ? AST 16 05/20/2021  ?  ALKPHOS 101 05/20/2021  ? BILITOT 0.4 05/20/2021  ? ?Lab Results  ?Component Value Date  ? HGBA1C 5.1 05/20/2021  ? HGBA1C 5.2 05/28/2020  ? HGBA1C 5.1 11/28/2019  ? HGBA1C 5.2 09/20/2014  ? HGBA1C 5.3 10/07/2010  ? ?Lab Results  ?Component Value Date  ? INSULIN 9.2 05/20/2021  ? INSULIN 6.9 06/18/2020  ? ?Lab Results  ?Component Value Date  ? TSH 1.13 07/17/2021  ? ?Lab Results  ?Component Value Date  ? CHOL 265 (H) 05/01/2021   ? HDL 70 05/01/2021  ? LDLCALC 178 (H) 05/01/2021  ? TRIG 100 05/01/2021  ? CHOLHDL 3.8 05/01/2021  ? ?Lab Results  ?Component Value Date  ? VD25OH 46.1 05/20/2021  ? VD25OH 40.1 11/06/2020  ? VD25OH 43 05/28/2020  ? ?Lab Results  ?Component Value Date  ? WBC 5.6 06/03/2021  ? HGB 13.8 06/03/2021  ? HCT 42.5 06/03/2021  ? MCV 83.2 06/03/2021  ? PLT 259 06/03/2021  ? ?Lab Results  ?Component Value Date  ? IRON 47 01/15/2017  ? TIBC 448 01/15/2017  ? FERRITIN 122 06/18/2020  ? ?Attestation Statements:  ? ?Reviewed by clinician on day of visit: allergies, medications, problem list, medical history, surgical history, family history, social history, and previous encounter notes. ? ?I, Tonye Pearson, am acting as Location manager for [employer], PA-C. ? ?I have reviewed the above documentation for accuracy and completeness, and I agree with the above. Abby Potash, PA-C ? ?

## 2021-08-07 ENCOUNTER — Ambulatory Visit (INDEPENDENT_AMBULATORY_CARE_PROVIDER_SITE_OTHER): Payer: No Typology Code available for payment source | Admitting: Physician Assistant

## 2021-09-01 ENCOUNTER — Ambulatory Visit (INDEPENDENT_AMBULATORY_CARE_PROVIDER_SITE_OTHER): Payer: No Typology Code available for payment source | Admitting: Family Medicine

## 2021-09-01 ENCOUNTER — Ambulatory Visit (INDEPENDENT_AMBULATORY_CARE_PROVIDER_SITE_OTHER): Payer: No Typology Code available for payment source | Admitting: Physician Assistant

## 2021-09-01 ENCOUNTER — Encounter (INDEPENDENT_AMBULATORY_CARE_PROVIDER_SITE_OTHER): Payer: Self-pay | Admitting: Family Medicine

## 2021-09-01 VITALS — BP 113/78 | HR 55 | Temp 97.6°F | Ht 67.0 in | Wt 203.0 lb

## 2021-09-01 DIAGNOSIS — Z9189 Other specified personal risk factors, not elsewhere classified: Secondary | ICD-10-CM

## 2021-09-01 DIAGNOSIS — E8881 Metabolic syndrome: Secondary | ICD-10-CM

## 2021-09-01 DIAGNOSIS — E559 Vitamin D deficiency, unspecified: Secondary | ICD-10-CM | POA: Diagnosis not present

## 2021-09-01 DIAGNOSIS — E538 Deficiency of other specified B group vitamins: Secondary | ICD-10-CM | POA: Diagnosis not present

## 2021-09-01 DIAGNOSIS — Z6831 Body mass index (BMI) 31.0-31.9, adult: Secondary | ICD-10-CM

## 2021-09-01 DIAGNOSIS — E669 Obesity, unspecified: Secondary | ICD-10-CM

## 2021-09-01 MED ORDER — VITAMIN D (ERGOCALCIFEROL) 1.25 MG (50000 UNIT) PO CAPS: 50000.0000 [IU] | ORAL_CAPSULE | ORAL | 0 refills | Status: DC

## 2021-09-04 ENCOUNTER — Ambulatory Visit (INDEPENDENT_AMBULATORY_CARE_PROVIDER_SITE_OTHER): Payer: No Typology Code available for payment source | Admitting: Family Medicine

## 2021-09-04 LAB — LIPID PANEL
Chol/HDL Ratio: 3.5 ratio (ref 0.0–4.4)
Cholesterol, Total: 211 mg/dL — ABNORMAL HIGH (ref 100–199)
HDL: 60 mg/dL (ref >39)
LDL Chol Calc (NIH): 133 mg/dL — ABNORMAL HIGH (ref 0–99)
Triglycerides: 100 mg/dL (ref 0–149)
VLDL Cholesterol Cal: 18 mg/dL (ref 5–40)

## 2021-09-08 ENCOUNTER — Encounter: Payer: Self-pay | Admitting: Internal Medicine

## 2021-09-08 ENCOUNTER — Ambulatory Visit (INDEPENDENT_AMBULATORY_CARE_PROVIDER_SITE_OTHER): Payer: No Typology Code available for payment source | Admitting: Internal Medicine

## 2021-09-08 VITALS — BP 118/82 | HR 65 | Ht 67.0 in | Wt 206.8 lb

## 2021-09-08 DIAGNOSIS — E785 Hyperlipidemia, unspecified: Secondary | ICD-10-CM

## 2021-09-08 DIAGNOSIS — Z8342 Family history of familial hypercholesterolemia: Secondary | ICD-10-CM

## 2021-09-08 DIAGNOSIS — M791 Myalgia, unspecified site: Secondary | ICD-10-CM

## 2021-09-08 DIAGNOSIS — T466X5S Adverse effect of antihyperlipidemic and antiarteriosclerotic drugs, sequela: Secondary | ICD-10-CM

## 2021-09-08 NOTE — Progress Notes (Signed)
LIPID CLINIC CONSULT NOTE  Chief Complaint:  Follow-up dyslipidemia  Primary Care Physician: Elby Showers, MD  Primary Cardiologist:  None  HPI:  Tricia Miranda is a 56 y.o. female who is being seen today for the evaluation of dyslipidemia at the request of Baxley, Cresenciano Lick, MD. This is a pleasant 56 year old female kindly referred for evaluation management of dyslipidemia.  Her PCP had referred her due to progressively worsening dyslipidemia for management recommendations.  According to Tricia Miranda she has had longstanding issues with high cholesterol and had previously at one point been on simvastatin which she said caused significant joint aches and feeling of overall muscle soreness.  Subsequently in the past had been changed over to rosuvastatin 5 mg daily which she said she took for a number of years without any significant side effects but then started to develop more significant muscle and joint aches including feelings of muscle pulsations.  Her rosuvastatin was then decreased to a point where she was only on it twice weekly.  Her LDL a year ago was 115 but has since risen and currently is 141, total cholesterol 229, HDL 50 and triglycerides 249.  This data was 3 months ago and she reports that she has been off of her statin medication since that time so I expect that her cholesterol may be higher.  Fortunately, it appears that she has few other cardiovascular risk factors.  She does not have any diabetes, hypertension, known coronary disease or prior to cardiovascular events.  There is heart disease in her father who had two-vessel bypass but was a smoker and her mother reportedly had very high cholesterol she says in the 400s and was on medication for a while but then was told to come off of it after heart catheterization showed no significant coronary disease but subsequently then was restarted on it.  She reports a variable but generally low saturated fat diet with fish, chicken and  vegetables and other healthy sources.  06/19/2020  Tricia Miranda returns today for follow-up. She underwent coronary calcium scoring in November which showed 0 coronary calcium however she was noted to have hepatic steatosis. She has been working on weight loss and was referred to the [facility] health weight management center. She did have repeat lipids however they are slightly higher. Total cholesterol now 239, HDL 56, triglycerides 151 and LDL 155 (up from 143). She said is actually been a difficult couple of months having to care for her husband who injured his leg while fishing.  12/17/2020  Tricia Miranda is seen today in follow-up.  She has been working with [employer] management for weight loss.  She says she is down about 24 pounds.  Today weight was 213 pounds.  Despite this, her cholesterol has increased.  Total cholesterol was 251 in July with HDL 58, triglycerides 122 (this is reduced) and LDL 171.  She still feels that she is able to get her cholesterol lower with just diet alone.  She mentioned that her cholesterol was quite low back when she weighed 150 pounds a number of years ago.  Since she had no coronary artery calcium, it is felt that she is not at a high short-term risk of not being on medication.  05/30/2021  Tricia Miranda returns today for follow-up.  She is recently seen her primary care provider who had highly encouraged her to consider management of her lipids.  Her cholesterol was retested and the LDL is gone up from 171-178,  total cholesterol 265, triglycerides 100 and HDL 70.  She had reported as previously mentioned that her mother had very high cholesterol but actually had no coronary disease by cath.  She may have a mutation in APO B which can sometimes be associated with lower cardiovascular risk.  Nonetheless I do suspect this is a genetic cholesterol disorder.  She is also recently been seeking treatment for ADHD and had undergone genetic and possibly pharmacogenetics testing which is  pending.  We discussed some other alternative therapies to the statins.  09/08/2021  Tricia Miranda is seen today in follow-up.  Overall she has had some response to ezetimibe.  She seems to be tolerating it well.  She reports she does not always remember to take it on a daily basis but at least takes it every other day.  Numbers may otherwise improve if she were to take it daily.  Her cholesterol has come down with total 211, down from 265 and LDL 133 down from 178.  She is also made some dietary changes and increase her exercise.   PMHx:  Past Medical History:  Diagnosis Date   Anxiety    Anxiety    Bronchitis    Cancer (HCC)    basal cell- tiny   Cancer ([place])    Fibroids    uterine    High cholesterol    Hypothyroidism    Joint pain    Low iron    Low vitamin D level    Osteoarthritis    Sleep apnea    uses oral device for mild sleep apnea    Thyroid disease    hypothyroidism   Vitamin B deficiency     Past Surgical History:  Procedure Laterality Date   ABDOMINAL HYSTERECTOMY     COLONOSCOPY     with benign  polyp removal    DILITATION & CURRETTAGE/HYSTROSCOPY WITH VERSAPOINT RESECTION N/A 03/16/2014   Procedure: DILATATION & CURETTAGE/HYSTEROSCOPY WITH VERSAPOINT RESECTION;  Surgeon: [name], MD;  Location: [facility];  Service: Gynecology;  Laterality: N/A;  1 hr.   HYSTERECTOMY ABDOMINAL WITH SALPINGECTOMY Bilateral 11/15/2018   Procedure: HYSTERECTOMY ABDOMINAL WITH  BILATERAL SALPINGECTOMY;  Surgeon: [name], MD;  Location: [facility];  Service: Gynecology;  Laterality: Bilateral;   MOHS SURGERY Left a while- unknown   under left eyelid   WISDOM TOOTH EXTRACTION      FAMHx:  Family History  Problem Relation Age of Onset   Hypertension Mother    High Cholesterol Mother    Sleep apnea Mother    Heart disease Father    Alcohol abuse Father    Anxiety disorder Father    Stomach cancer Other    Colon cancer Neg Hx    Esophageal  cancer Neg Hx    Pancreatic cancer Neg Hx    Prostate cancer Neg Hx    Rectal cancer Neg Hx     SOCHx:   reports that she has never smoked. She has never used smokeless tobacco. She reports that she does not currently use alcohol. She reports that she does not use drugs.  ALLERGIES:  No Known Allergies  ROS: Pertinent items noted in HPI and remainder of comprehensive ROS otherwise negative.  HOME MEDS: Current Outpatient Medications on File Prior to Visit  Medication Sig Dispense Refill   ALPRAZolam (XANAX) 0.25 MG tablet TAKE ONE TABLET BY MOUTH TWICE A DAY AS NEEDED 60 tablet 3   cetirizine (ZYRTEC) 10 MG tablet Take 10 mg by mouth  daily as needed for allergies.     cyanocobalamin 1000 MCG tablet Take 1,000 mcg by mouth daily.     ezetimibe (ZETIA) 10 MG tablet Take 1 tablet (10 mg total) by mouth daily. 90 tablet 3   FLUoxetine (PROZAC) 20 MG capsule Take 20 mg by mouth daily.     SYNTHROID 100 MCG tablet Take 1 tablet (100 mcg total) by mouth daily before breakfast. 90 tablet 1   Vitamin D, Ergocalciferol, (DRISDOL) 1.25 MG (50000 UNIT) CAPS capsule Take 1 capsule (50,000 Units total) by mouth once a week. 4 capsule 0   No current facility-administered medications on file prior to visit.    LABS/IMAGING: No results found for this or any previous visit (from the past 48 hour(s)).  No results found.  LIPID PANEL:    Component Value Date/Time   CHOL 211 (H) 09/04/2021 0937   TRIG 100 09/04/2021 0937   HDL 60 09/04/2021 0937   CHOLHDL 3.5 09/04/2021 0937   CHOLHDL 4.3 05/28/2020 1145   VLDL 26 11/08/2015 1026   LDLCALC 133 (H) 09/04/2021 0937   LDLCALC 155 (H) 05/28/2020 1145    WEIGHTS: Wt Readings from Last 3 Encounters:  09/08/21 206 lb 12.8 oz (93.8 kg)  09/01/21 203 lb (92.1 kg)  07/21/21 203 lb (92.1 kg)    VITALS: BP 118/82   Pulse 65   Ht '5\' 7"'$  (1.702 m)   Wt 206 lb 12.8 oz (93.8 kg)   LMP 11/15/2018   SpO2 98%   BMI 32.39 kg/m    EXAM: Deferred  EKG: Deferred  ASSESSMENT: Mixed dyslipidemia Statin intolerance - myalgias Family history of high cholesterol Moderate obesity Zero CAC (02/2020)-hepatic steatosis  PLAN: 1.   Tricia Miranda has had a nice improvement in her lipids on ezetimibe.  She seems to be tolerating it and says she does not always remember to take it on a daily basis.  This could further improve then.  Additionally, she is exercising more and working with weight management.  I suspect her numbers will further improve.  She has follow-up with her PCP in 3 months and we will plan to follow-up with repeat lipids in 6 months.  No changes at this time.  Pixie Casino, MD, [facility], [place] Director of the Advanced Lipid Disorders &  Cardiovascular Risk Reduction Clinic Diplomate of the American Board of Clinical Lipidology Attending Cardiologist  Direct Dial: [phone]  Fax: [phone]  Website:  [email or web address] 09/08/2021, 9:42 AM

## 2021-09-08 NOTE — Patient Instructions (Signed)
Medication Instructions:  NO CHANGES  *If you need a refill on your cardiac medications before your next appointment, please call your pharmacy*   Lab Work: FASTING lab work to check cholesterol in 6 months   If you have labs (blood work) drawn today and your tests are completely normal, you will receive your results only by: [place] (if you have MyChart) OR A paper copy in the mail If you have any lab test that is abnormal or we need to change your treatment, we will call you to review the results.   Testing/Procedures: NONE   Follow-Up: At [facility], you and your health needs are our priority.  As part of our continuing mission to provide you with exceptional heart care, we have created designated Provider Care Teams.  These Care Teams include your primary Cardiologist (physician) and Advanced Practice Providers (APPs -  Physician Assistants and Nurse Practitioners) who all work together to provide you with the care you need, when you need it.  We recommend signing up for the patient portal called "MyChart".  Sign up information is provided on this After Visit Summary.  MyChart is used to connect with patients for Virtual Visits (Telemedicine).  Patients are able to view lab/test results, encounter notes, upcoming appointments, etc.  Non-urgent messages can be sent to your provider as well.   To learn more about what you can do with MyChart, go to [email or web address].    Your next appointment:   6 month(s)  The format for your next appointment:   In Person  Provider:   Lyman Bishop MD - lipid clinic

## 2021-09-13 NOTE — Progress Notes (Unsigned)
Chief Complaint:   OBESITY Tricia Miranda is here to discuss her progress with her obesity treatment plan along with follow-up of her obesity related diagnoses. Tricia Miranda is on the Category 3 Plan and states she is following her eating plan approximately 80% of the time. Tricia Miranda states she is doing Pilates and walking 30-50 minutes 5 times per week.  Today's visit was #: 21 Starting weight: 224 lbs Starting date: 06/18/2020 Today's weight: 203 lbs Today's date: 09/01/2021 Total lbs lost to date: 21 Total lbs lost since last in-office visit: 0  Interim History: Tricia Miranda traveled 5 days in the last few weeks, and her son came into town for a few days, thus she ate more carbs than usual. Tricia Miranda is not so happy that she didn't lose any weight.  Subjective:   1. Vitamin D deficiency She is currently taking prescription vitamin D 50,000 IU each week. She denies nausea, vomiting or muscle weakness. Vit D level was at goal when last checked a couple of months ago.  2. Vitamin B 12 deficiency B12 level was initially 292. Tricia Miranda is taking OTC B12 1000 mcg daily. Lab was last checked a couple of months ago and revealed level of 724.  3. Insulin resistance Tricia Miranda's last fasting insulin was 6.9, checked a year ago. She denies sweets or carb cravings when not traveling or on vacation and eating on plan. Medication: None  4. At risk for impaired metabolic function Tricia Miranda is at increased risk for impaired metabolic function due to current nutrition and muscle mass.  Assessment/Plan:  No orders of the defined types were placed in this encounter.   Medications Discontinued During This Encounter  Medication Reason   Vitamin D, Ergocalciferol, (DRISDOL) 1.25 MG (50000 UNIT) CAPS capsule Reorder     Meds ordered this encounter  Medications   Vitamin D, Ergocalciferol, (DRISDOL) 1.25 MG (50000 UNIT) CAPS capsule    Sig: Take 1 capsule (50,000 Units total) by mouth once a week.    Dispense:  4 capsule    Refill:  0    30 d  supply;  ** OV for RF **   Do not send RF request     1. Vitamin D deficiency Low Vitamin D level contributes to fatigue and are associated with obesity, breast, and colon cancer. She agrees to continue to take prescription Vitamin D '@50'$ ,000 IU every week and will follow-up for routine testing of Vitamin D, at least 2-3 times per year to avoid over-replacement. Repeat Vit D at next OV.  Refill- Vitamin D, Ergocalciferol, (DRISDOL) 1.25 MG (50000 UNIT) CAPS capsule; Take 1 capsule (50,000 Units total) by mouth once a week.  Dispense: 4 capsule; Refill: 0  2. Vitamin B 12 deficiency Continue OTC B12 supplement and prudent nutritional plan. We will continue to monitor along with Tricia Miranda's weight loss journey.  3. Insulin resistance [place] will continue to work on weight loss, exercise, and decreasing simple carbohydrates to help decrease the risk of diabetes. Tricia Miranda agreed to follow-up with [place] as directed to closely monitor her progress. Consider rechecking fasting insulin in the near future. Continue prudent nutritional plan and weight loss.  4. At risk for impaired metabolic function Due to Tricia Miranda's current state of health and medical condition(s), she is at a significantly higher risk for impaired metabolic function.  At least 9 minutes was spent on counseling Tricia Miranda about these concerns today. This places the patient at a much greater risk to subsequently develop cardio-pulmonary conditions that can negatively affect  the patient's quality of life. I stressed the importance of reversing these risks factors.  The initial goal is to lose at least 5-10% of starting weight to help reduce risk factors. Counseling:  Intensive lifestyle modifications discussed with Latondra as the most appropriate first line treatment.  she will continue to work on diet, exercise, and weight loss efforts.  We will continue to reassess these conditions on a fairly regular basis in an attempt to decrease the patient's overall morbidity  and mortality.  5. Obesity, Current BMI 31.8 Tricia Miranda is currently in the action stage of change. As such, her goal is to continue with weight loss efforts. She has agreed to the Category 3 Plan but journal input and bring log in to next OV.   Exercise goals:  As is  Behavioral modification strategies: increasing lean protein intake, decreasing simple carbohydrates, and keeping healthy foods in the home.  Tricia Miranda has agreed to follow-up with our clinic in 3 weeks. She was informed of the importance of frequent follow-up visits to maximize her success with intensive lifestyle modifications for her multiple health conditions.   Objective:   Blood pressure 113/78, pulse (!) 55, temperature 97.6 F (36.4 C), height '5\' 7"'$  (1.702 m), weight 203 lb (92.1 kg), last menstrual period 11/15/2018, SpO2 96 %. Body mass index is 31.79 kg/m.  General: Cooperative, alert, well developed, in no acute distress. HEENT: Conjunctivae and lids unremarkable. Cardiovascular: Regular rhythm.  Lungs: Normal work of breathing. Neurologic: No focal deficits.   Lab Results  Component Value Date   CREATININE 0.55 (L) 05/20/2021   BUN 15 05/20/2021   NA 138 05/20/2021   K 4.3 05/20/2021   CL 99 05/20/2021   CO2 24 05/20/2021   Lab Results  Component Value Date   ALT 15 05/20/2021   AST 16 05/20/2021   ALKPHOS 101 05/20/2021   BILITOT 0.4 05/20/2021   Lab Results  Component Value Date   HGBA1C 5.1 05/20/2021   HGBA1C 5.2 05/28/2020   HGBA1C 5.1 11/28/2019   HGBA1C 5.2 09/20/2014   HGBA1C 5.3 10/07/2010   Lab Results  Component Value Date   INSULIN 9.2 05/20/2021   INSULIN 6.9 06/18/2020   Lab Results  Component Value Date   TSH 1.13 07/17/2021   Lab Results  Component Value Date   CHOL 211 (H) 09/04/2021   HDL 60 09/04/2021   LDLCALC 133 (H) 09/04/2021   TRIG 100 09/04/2021   CHOLHDL 3.5 09/04/2021   Lab Results  Component Value Date   VD25OH 46.1 05/20/2021   VD25OH 40.1 11/06/2020    VD25OH 43 05/28/2020   Lab Results  Component Value Date   WBC 5.6 06/03/2021   HGB 13.8 06/03/2021   HCT 42.5 06/03/2021   MCV 83.2 06/03/2021   PLT 259 06/03/2021   Lab Results  Component Value Date   IRON 47 01/15/2017   TIBC 448 01/15/2017   FERRITIN 122 06/18/2020    Attestation Statements:   Reviewed by clinician on day of visit: allergies, medications, problem list, medical history, surgical history, family history, social history, and previous encounter notes.  I, Kathlene November, BS, CMA, am acting as transcriptionist for [employer], DO.  I have reviewed the above documentation for accuracy and completeness, and I agree with the above. Marjory Sneddon, D.O.  [facility] [place] was signed into law in 2016 which includes the topic of electronic health records.  This provides immediate access to information in MyChart.  This  includes consultation notes, operative notes, office notes, lab results and pathology reports.  If you have any questions about what you read please let us know at your next visit so we can discuss your concerns and take corrective action if need be.  We are right here with you.

## 2021-09-29 ENCOUNTER — Ambulatory Visit (INDEPENDENT_AMBULATORY_CARE_PROVIDER_SITE_OTHER): Payer: No Typology Code available for payment source | Admitting: Adult Health

## 2021-10-09 ENCOUNTER — Ambulatory Visit (INDEPENDENT_AMBULATORY_CARE_PROVIDER_SITE_OTHER): Payer: No Typology Code available for payment source | Admitting: Adult Health

## 2021-10-09 ENCOUNTER — Encounter (INDEPENDENT_AMBULATORY_CARE_PROVIDER_SITE_OTHER): Payer: Self-pay | Admitting: Adult Health

## 2021-10-09 VITALS — BP 107/71 | HR 66 | Temp 98.1°F | Ht 67.0 in | Wt 205.0 lb

## 2021-10-09 DIAGNOSIS — E7849 Other hyperlipidemia: Secondary | ICD-10-CM

## 2021-10-09 DIAGNOSIS — E66812 Obesity, class 2: Secondary | ICD-10-CM

## 2021-10-09 DIAGNOSIS — E669 Obesity, unspecified: Secondary | ICD-10-CM

## 2021-10-09 DIAGNOSIS — K30 Functional dyspepsia: Secondary | ICD-10-CM | POA: Diagnosis not present

## 2021-10-09 DIAGNOSIS — Z6832 Body mass index (BMI) 32.0-32.9, adult: Secondary | ICD-10-CM | POA: Diagnosis not present

## 2021-10-13 NOTE — Progress Notes (Signed)
Chief Complaint:   OBESITY Tricia Miranda is here to discuss her progress with her obesity treatment plan along with follow-up of her obesity related diagnoses. Tricia Miranda is on the Category 3 Plan and states she is following her eating plan approximately 70% of the time. Tricia Miranda states she is at the gym for 60 minutes 2-3 times per week.  Today's visit was #: 22 Starting weight: 224 lbs Starting date: 06/18/2020 Today's weight: 205 lbs Today's date: 10/09/2021 Total lbs lost to date: 19 Total lbs lost since last in-office visit: 0  Interim History:  Since Memorial Day, Tricia Miranda has been experiencing flatulence, dyspepsia, and constipation. She reports current sx's of "queasiness" and "sticky stools".  She has yet to be seen by either by her PCP or GI for these acute symptoms.   Since her last office visit, she has not been consuming all of her prescribed protein.  Subjective:   1. Upset stomach Since Memorial Day, Tricia Miranda has been experiencing flatulence, dyspepsia, and constipation. She reports current sx's of "queasiness" and "sticky stools".  She has yet to be seen by either by her PCP or GI for these acute symptoms.   Last Colonoscopy 03/22/2017  2. Other hyperlipidemia Lelan Pons is ASCVD risk stratification score is 1.4%.   She is taking Zetia 10 mg daily-managed by cardiologist, Dr. Geralyn Corwin. Lipid Panel     Component Value Date/Time   CHOL 211 (H) 09/04/2021 0937   TRIG 100 09/04/2021 0937   HDL 60 09/04/2021 0937   CHOLHDL 3.5 09/04/2021 0937   CHOLHDL 4.3 05/28/2020 1145   VLDL 26 11/08/2015 1026   LDLCALC 133 (H) 09/04/2021 0937   LDLCALC 155 (H) 05/28/2020 1145   LABVLDL 18 09/04/2021 0937    Assessment/Plan:   1. Upset stomach Kimble will follow up with her PCP.  2. Other hyperlipidemia [place] will continue daily Zetia 10 mg daily.  3. Obesity, Current BMI 32.2 Tricia Miranda is currently in the action stage of change. As such, her goal is to continue with weight loss efforts. She has  agreed to the Category 3 Plan and keeping a food journal and adhering to recommended goals of 450-600 calories and 40 grams of protein at supper daily.   Exercise goals: As is.   Behavioral modification strategies: increasing lean protein intake, decreasing simple carbohydrates, meal planning and cooking strategies, keeping healthy foods in the home, and planning for success.  Tricia Miranda has agreed to follow-up with our clinic in 3 weeks with myself or Dr. Raliegh Scarlet. She was informed of the importance of frequent follow-up visits to maximize her success with intensive lifestyle modifications for her multiple health conditions.   Objective:   Blood pressure 107/71, pulse 66, temperature 98.1 F (36.7 C), height '5\' 7"'$  (1.702 m), weight 205 lb (93 kg), last menstrual period 11/15/2018, SpO2 96 %. Body mass index is 32.11 kg/m.  General: Cooperative, alert, well developed, in no acute distress. HEENT: Conjunctivae and lids unremarkable. Cardiovascular: Regular rhythm.  Lungs: Normal work of breathing. Neurologic: No focal deficits.   Lab Results  Component Value Date   CREATININE 0.55 (L) 05/20/2021   BUN 15 05/20/2021   NA 138 05/20/2021   K 4.3 05/20/2021   CL 99 05/20/2021   CO2 24 05/20/2021   Lab Results  Component Value Date   ALT 15 05/20/2021   AST 16 05/20/2021   ALKPHOS 101 05/20/2021   BILITOT 0.4 05/20/2021   Lab Results  Component Value Date   HGBA1C 5.1 05/20/2021  HGBA1C 5.2 05/28/2020   HGBA1C 5.1 11/28/2019   HGBA1C 5.2 09/20/2014   HGBA1C 5.3 10/07/2010   Lab Results  Component Value Date   INSULIN 9.2 05/20/2021   INSULIN 6.9 06/18/2020   Lab Results  Component Value Date   TSH 1.13 07/17/2021   Lab Results  Component Value Date   CHOL 211 (H) 09/04/2021   HDL 60 09/04/2021   LDLCALC 133 (H) 09/04/2021   TRIG 100 09/04/2021   CHOLHDL 3.5 09/04/2021   Lab Results  Component Value Date   VD25OH 46.1 05/20/2021   VD25OH 40.1 11/06/2020   VD25OH  43 05/28/2020   Lab Results  Component Value Date   WBC 5.6 06/03/2021   HGB 13.8 06/03/2021   HCT 42.5 06/03/2021   MCV 83.2 06/03/2021   PLT 259 06/03/2021   Lab Results  Component Value Date   IRON 47 01/15/2017   TIBC 448 01/15/2017   FERRITIN 122 06/18/2020   Attestation Statements:   Reviewed by clinician on day of visit: allergies, medications, problem list, medical history, surgical history, family history, social history, and previous encounter notes.   Wilhemena Durie, am acting as transcriptionist for Mina Marble, NP.  I have reviewed the above documentation for accuracy and completeness, and I agree with the above. -  Tarl Cephas d. Tameya Kuznia, NP-C

## 2021-10-16 DIAGNOSIS — E7849 Other hyperlipidemia: Secondary | ICD-10-CM | POA: Insufficient documentation

## 2021-10-16 DIAGNOSIS — K30 Functional dyspepsia: Secondary | ICD-10-CM | POA: Insufficient documentation

## 2021-10-16 NOTE — Progress Notes (Signed)
Detailed message left on identifiable machine belonging to the patient. Advised to call  if having any issues with Gi for appointment.

## 2021-10-17 ENCOUNTER — Ambulatory Visit (INDEPENDENT_AMBULATORY_CARE_PROVIDER_SITE_OTHER): Payer: No Typology Code available for payment source | Admitting: Internal Medicine

## 2021-10-17 ENCOUNTER — Encounter: Payer: Self-pay | Admitting: Internal Medicine

## 2021-10-17 ENCOUNTER — Ambulatory Visit
Admission: RE | Admit: 2021-10-17 | Discharge: 2021-10-17 | Disposition: A | Discharge status: Home / Self Care | Payer: No Typology Code available for payment source | Source: Ambulatory Visit | Attending: Internal Medicine | Admitting: Internal Medicine

## 2021-10-17 VITALS — BP 122/82 | HR 63 | Temp 98.0°F | Wt 212.8 lb

## 2021-10-17 DIAGNOSIS — R1011 Right upper quadrant pain: Secondary | ICD-10-CM

## 2021-10-17 DIAGNOSIS — H9203 Otalgia, bilateral: Secondary | ICD-10-CM

## 2021-10-17 DIAGNOSIS — R110 Nausea: Secondary | ICD-10-CM

## 2021-10-17 DIAGNOSIS — H6501 Acute serous otitis media, right ear: Secondary | ICD-10-CM

## 2021-10-17 DIAGNOSIS — K5900 Constipation, unspecified: Secondary | ICD-10-CM | POA: Diagnosis not present

## 2021-10-17 DIAGNOSIS — E038 Other specified hypothyroidism: Secondary | ICD-10-CM

## 2021-10-17 NOTE — Progress Notes (Signed)
   Subjective:    Patient ID: Tricia Miranda, female    DOB: Jun 14, 1965, 56 y.o.   MRN: [id number]  HPI  56 year old Female seen for vague abdominal issues. Has been having gas and constipation. Has some intermittent RUQ discomfort. She mentioned this to Bariatric NP, Mina Marble who sent me  a note on June 22 and I suggested OV here. She is also due for follow up on hypothyroidism.  Labs drawn today include CBC with differential, c-Met, lipase, amylase, TSH, and lipid panel.  She will also have KUB abdominal film today to see if she is constipated.  Depending on these results, we may want to do an ultrasound of her gallbladder.  This could be irritable bowel syndrome or constipation.  It could possibly be cholecystitis as well.  She was informed of this differential diagnosis today.  Had colonoscopy per Dr. Havery Moros in 2018 with 10-year follow-up recommended.  A polypoid lesion was removed which was benign polypoid mucosa.  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  Her son will be attending [employer] this Fall.  Review of Systems occasional sharp pain RUQ and LUQ at night after eating.     Objective:   Physical Exam Blood pressure 122/82, temperature 98 degrees, pulse 63, pulse oximetry 98% on room air, BMI 33.32 Abdomen is soft, nondistended without hepatosplenomegaly or masses.  There is some slight epigastric tenderness but no rebound tenderness.  Rectal exam was not performed today.  Also complaining of some otalgia in both ears.  TMs are not red but has slight fullness of right TM that may respond to decongestant.    Assessment & Plan:  Flatulence, right upper quadrant discomfort, constipation  Differential includes constipation, irritable bowel syndrome, possible gallbladder disease, pancreatitis  History of hypothyroidism-TSH  checked today  Right serous otitis media-May take over-the-counter decongestant sparingly as needed for few days  Plan: She will have KUB x-ray today with further recommendations to follow.  Also drawn today include amylase, lipase, TSH, lipid panel, CBC and c-Met.   Time spent with patient is 30 minutes

## 2021-10-17 NOTE — Patient Instructions (Addendum)
Have KUB today to see if constipation is present.  May recommend MiraLAX or fiber supplement depending on findings.  Labs drawn and pending.  May take over-the-counter decongestant sparingly for few days for right otitis media/ear fullness.  TSH checked today in follow-up of hypothyroidism.  Blood pressure is stable.

## 2021-10-18 LAB — LIPASE: Lipase: 13 U/L (ref 7–60)

## 2021-10-18 LAB — COMPLETE METABOLIC PANEL WITH GFR
AG Ratio: 1.7 (calc) (ref 1.0–2.5)
ALT: 17 U/L (ref 6–29)
AST: 14 U/L (ref 10–35)
Albumin: 4.3 g/dL (ref 3.6–5.1)
Alkaline phosphatase (APISO): 88 U/L (ref 37–153)
BUN: 19 mg/dL (ref 7–25)
CO2: 29 mmol/L (ref 20–32)
Calcium: 9.7 mg/dL (ref 8.6–10.4)
Chloride: 104 mmol/L (ref 98–110)
Creat: 0.58 mg/dL (ref 0.50–1.03)
Globulin: 2.5 g/dL (calc) (ref 1.9–3.7)
Glucose, Bld: 87 mg/dL (ref 65–99)
Potassium: 4.9 mmol/L (ref 3.5–5.3)
Sodium: 141 mmol/L (ref 135–146)
Total Bilirubin: 0.5 mg/dL (ref 0.2–1.2)
Total Protein: 6.8 g/dL (ref 6.1–8.1)
eGFR: 107 mL/min/{1.73_m2} (ref >=60)

## 2021-10-18 LAB — CBC WITH DIFFERENTIAL/PLATELET
Absolute Monocytes: 331 cells/uL (ref 200–950)
Basophils Absolute: 70 cells/uL (ref 0–200)
Basophils Relative: 1.2 %
Eosinophils Absolute: 168 cells/uL (ref 15–500)
Eosinophils Relative: 2.9 %
HCT: 42.3 % (ref 35.0–45.0)
Hemoglobin: 14 g/dL (ref 11.7–15.5)
Lymphs Abs: 2105 cells/uL (ref 850–3900)
MCH: 28.3 pg (ref 27.0–33.0)
MCHC: 33.1 g/dL (ref 32.0–36.0)
MCV: 85.5 fL (ref 80.0–100.0)
MPV: 10.2 fL (ref 7.5–12.5)
Monocytes Relative: 5.7 %
Neutro Abs: 3126 cells/uL (ref 1500–7800)
Neutrophils Relative %: 53.9 %
Platelets: 255 10*3/uL (ref 140–400)
RBC: 4.95 10*6/uL (ref 3.80–5.10)
RDW: 12.5 % (ref 11.0–15.0)
Total Lymphocyte: 36.3 %
WBC: 5.8 10*3/uL (ref 3.8–10.8)

## 2021-10-18 LAB — LIPID PANEL
Cholesterol: 225 mg/dL — ABNORMAL HIGH (ref <200)
HDL: 68 mg/dL (ref >=50)
LDL Cholesterol (Calc): 139 mg/dL (calc) — ABNORMAL HIGH
Non-HDL Cholesterol (Calc): 157 mg/dL (calc) — ABNORMAL HIGH (ref <130)
Total CHOL/HDL Ratio: 3.3 (calc) (ref <5.0)
Triglycerides: 83 mg/dL (ref <150)

## 2021-10-18 LAB — AMYLASE: Amylase: 30 U/L (ref 21–101)

## 2021-10-18 LAB — TSH: TSH: 1.29 mIU/L

## 2021-10-23 ENCOUNTER — Telehealth: Payer: Self-pay | Admitting: Internal Medicine

## 2021-10-23 ENCOUNTER — Telehealth: Payer: Self-pay

## 2021-10-23 NOTE — Telephone Encounter (Signed)
See note under Results section with specific instructions for trying Miralax daily to see if bloating and obstipation improve. Likely has irritable bowel with constipation/obstipation. Thyroid test is normal as are pancreatic enzymes. Call if further questions. Try Miralax for at least 2 weeks and may continue indefinitely if it helps bloating and irregularity. MJB

## 2021-10-23 NOTE — Telephone Encounter (Signed)
Patient calling for lab and xray results.

## 2021-11-11 ENCOUNTER — Ambulatory Visit (INDEPENDENT_AMBULATORY_CARE_PROVIDER_SITE_OTHER): Payer: No Typology Code available for payment source | Admitting: Adult Health

## 2021-11-24 ENCOUNTER — Ambulatory Visit (INDEPENDENT_AMBULATORY_CARE_PROVIDER_SITE_OTHER): Payer: No Typology Code available for payment source | Admitting: Orthopedic Surgery

## 2021-11-24 ENCOUNTER — Telehealth: Payer: Self-pay | Admitting: Orthopedic Surgery

## 2021-11-24 ENCOUNTER — Encounter: Payer: Self-pay | Admitting: Orthopedic Surgery

## 2021-11-24 ENCOUNTER — Telehealth: Payer: Self-pay

## 2021-11-24 DIAGNOSIS — M1711 Unilateral primary osteoarthritis, right knee: Secondary | ICD-10-CM | POA: Diagnosis not present

## 2021-11-24 DIAGNOSIS — M1712 Unilateral primary osteoarthritis, left knee: Secondary | ICD-10-CM | POA: Diagnosis not present

## 2021-11-24 MED ORDER — BUPIVACAINE HCL 0.25 % IJ SOLN
4.0000 mL | INTRAMUSCULAR | Status: AC | PRN
  Administered 2021-11-24: 4 mL via INTRA_ARTICULAR

## 2021-11-24 MED ORDER — METHYLPREDNISOLONE ACETATE 40 MG/ML IJ SUSP
40.0000 mg | INTRAMUSCULAR | Status: AC | PRN
  Administered 2021-11-24: 40 mg via INTRA_ARTICULAR

## 2021-11-24 MED ORDER — LIDOCAINE HCL 1 % IJ SOLN
5.0000 mL | INTRAMUSCULAR | Status: AC | PRN
  Administered 2021-11-24: 5 mL

## 2021-11-24 NOTE — Progress Notes (Signed)
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  Did have gel injections in November of last year which helped.  Taking Mobic as needed for pain.  She reports gradual onset over the past month of predominantly right knee pain.  Worse going up and down stairs.  She has had 2 prior injections on the right knee 1 prior injection in the left knee.  Denies much in the way of mechanical symptoms.  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              ROS: All systems reviewed are negative as they relate to the chief complaint within the history of present illness.  Patient denies  fevers or chills.   Assessment & Plan: Visit Diagnoses:  1. Unilateral primary osteoarthritis, left knee   2. Unilateral primary osteoarthritis, right knee     Plan: Impression is bilateral knee pain right worse than left with known history of arthritis.  Cortisone injection performed in the right knee today.  I think it is a good sign that she does not have any effusion.  Preapproved gel shots for when the left knee pain becomes more significant and or when the right knee cortisone shot wears off.  Continue with nonweightbearing quad lengthening exercises.  Follow-Up Instructions: Return if symptoms worsen or fail to improve.   Orders:  No orders of the defined types were placed in this encounter.  No orders of the defined types were placed in this encounter.     Procedures: Large Joint Inj: R knee on 11/24/2021 8:08 PM Indications: diagnostic evaluation, joint swelling and pain Details: 18 G 1.5  in needle, superolateral approach  Arthrogram: No  Medications: 5 mL lidocaine 1 %; 40 mg methylPREDNISolone acetate 40 MG/ML; 4 mL bupivacaine 0.25 % Outcome: tolerated well, no immediate complications Procedure, treatment alternatives, risks and benefits explained, specific risks discussed. Consent was given by the patient. Immediately prior to procedure a time out was called to verify the correct patient, procedure, equipment, support staff and site/side marked as required. Patient was prepped and draped in the usual sterile fashion.       Clinical Data: No additional findings.  Objective: Vital Signs: LMP 11/15/2018   Physical Exam:   Constitutional: Patient appears well-developed HEENT:  Head: Normocephalic Eyes:EOM are normal Neck: Normal range of motion Cardiovascular: Normal rate Pulmonary/chest: Effort normal Neurologic: Patient is alert Skin: Skin is warm Psychiatric: Patient has normal mood and affect   Ortho Exam: Ortho exam demonstrates full active and passive range of motion of both knees and hips.  Pedal pulses palpable.  Ankle dorsiflexion intact.  No effusion in either knee.  Has mild patellofemoral crepitus with intact extensor mechanism bilaterally.  Specialty Comments:  No specialty comments available.  Imaging: No results found.   PMFS History: Patient Active Problem List   Diagnosis Date Noted   Upset stomach 10/16/2021   Other hyperlipidemia 10/16/2021   Class 2 severe obesity with serious  comorbidity and body mass index (BMI) of 35.0 to 35.9 in adult [facility]) 10/16/2021   Vitamin D deficiency 08/12/2020   Family history of colonic polyps 05/31/2020   Irritable bowel syndrome 05/31/2020   Postoperative state 11/15/2018   Post-operative state 11/15/2018   Snoring 11/23/2017   Diarrhea 06/23/2012   History of vitamin D deficiency 11/14/2011   Essential tremor 11/14/2011   Hypothyroidism 10/09/2010   Anxiety 10/09/2010   Migraine 10/09/2010    Past Medical History:  Diagnosis Date   Anxiety    Anxiety    Bronchitis    Cancer ([place])    basal cell- tiny   Cancer ([place])    Fibroids    uterine    High cholesterol    Hypothyroidism    Joint pain    Low iron    Low vitamin D level    Osteoarthritis    Sleep apnea    uses oral device for mild sleep apnea    Thyroid disease    hypothyroidism   Vitamin B deficiency     Family History  Problem Relation Age of Onset   Hypertension Mother    High Cholesterol Mother    Sleep apnea Mother    Heart disease Father    Alcohol abuse Father    Anxiety disorder Father    Stomach cancer Other    Colon cancer Neg Hx    Esophageal cancer Neg Hx    Pancreatic cancer Neg Hx    Prostate cancer Neg Hx    Rectal cancer Neg Hx     Past Surgical History:  Procedure Laterality Date   ABDOMINAL HYSTERECTOMY     COLONOSCOPY     with benign  polyp removal    DILITATION & CURRETTAGE/HYSTROSCOPY WITH VERSAPOINT RESECTION N/A 03/16/2014   Procedure: DILATATION & CURETTAGE/HYSTEROSCOPY WITH VERSAPOINT RESECTION;  Surgeon: [name], MD;  Location: [facility];  Service: Gynecology;  Laterality: N/A;  1 hr.   HYSTERECTOMY ABDOMINAL WITH SALPINGECTOMY Bilateral 11/15/2018   Procedure: HYSTERECTOMY ABDOMINAL WITH  BILATERAL SALPINGECTOMY;  Surgeon: [name], MD;  Location: [facility];  Service: Gynecology;  Laterality: Bilateral;   MOHS SURGERY Left a while- unknown   under left eyelid   WISDOM TOOTH EXTRACTION     Social History   Occupational History   Occupation: Restaurant manager, fast food  Tobacco Use   Smoking status: Never   Smokeless tobacco: Never  Vaping Use   Vaping Use: Never used  Substance and Sexual Activity   Alcohol use: Not Currently    Comment: wine  with dinner occasionally or beer   Drug use: No   Sexual activity: Not Currently    Partners: Male    Comment: 1st intercourse- 31, partners- 34, married- 104 yrs , hysterectomy

## 2021-11-24 NOTE — Telephone Encounter (Signed)
Noted  

## 2021-11-24 NOTE — Telephone Encounter (Signed)
Patient came in for her appt stating she wanted a Gel injection 1 series Marlou Sa stated he may have enough information to get injection approved but Lurena Joiner stated she may need 1 more appt so we did set that appt just in case she needed it, Lurena Joiner stated she would need to come back in about 6 week for Gel injection. If injection can be approved without another appt please cancel appt made for 08/23

## 2021-11-24 NOTE — Telephone Encounter (Signed)
Auth needed for bilat knee gel  

## 2021-11-24 NOTE — Telephone Encounter (Signed)
Noted.  Will submit.  

## 2021-11-26 ENCOUNTER — Encounter (INDEPENDENT_AMBULATORY_CARE_PROVIDER_SITE_OTHER): Payer: Self-pay | Admitting: Adult Health

## 2021-11-26 ENCOUNTER — Encounter (INDEPENDENT_AMBULATORY_CARE_PROVIDER_SITE_OTHER): Payer: Self-pay

## 2021-11-26 ENCOUNTER — Ambulatory Visit (INDEPENDENT_AMBULATORY_CARE_PROVIDER_SITE_OTHER): Payer: No Typology Code available for payment source | Admitting: Adult Health

## 2021-11-26 VITALS — BP 124/85 | HR 60 | Temp 98.0°F | Ht 67.0 in | Wt 211.0 lb

## 2021-11-26 DIAGNOSIS — Z6833 Body mass index (BMI) 33.0-33.9, adult: Secondary | ICD-10-CM | POA: Diagnosis not present

## 2021-11-26 DIAGNOSIS — E669 Obesity, unspecified: Secondary | ICD-10-CM

## 2021-11-26 DIAGNOSIS — R1013 Epigastric pain: Secondary | ICD-10-CM | POA: Diagnosis not present

## 2021-11-26 NOTE — Telephone Encounter (Signed)
VOB submitted for Durolane, bilateral knee.  

## 2021-11-27 ENCOUNTER — Other Ambulatory Visit: Payer: Self-pay | Admitting: Obstetrics & Gynecology

## 2021-11-27 DIAGNOSIS — Z1231 Encounter for screening mammogram for malignant neoplasm of breast: Secondary | ICD-10-CM

## 2021-12-01 ENCOUNTER — Other Ambulatory Visit: Payer: Self-pay | Admitting: Internal Medicine

## 2021-12-02 ENCOUNTER — Telehealth: Payer: Self-pay

## 2021-12-02 NOTE — Telephone Encounter (Signed)
Faxed completed PA form to [employer] at [phone] for Durolane, bilateral knee. PA pending 

## 2021-12-03 NOTE — Progress Notes (Unsigned)
Chief Complaint:   OBESITY Tricia Miranda is here to discuss her progress with her obesity treatment plan along with follow-up of her obesity related diagnoses. Tricia Miranda is on the Category 3 Plan and states she is following her eating plan approximately 60% of the time. Tricia Miranda states she is walking 30 minutes 7 times per week.  Today's visit was #: 23 Starting weight: 224 lbs Starting date: 06/18/2020 Today's weight: 211 lbs Today's date: 11/26/2021 Total lbs lost to date: 19 lbs Total lbs lost since last in-office visit: +6  Interim History: Due to increased hours at work and 56 year old son's tonsillectomy, she has been eating out more frequently.   Subjective:   1. Dyspepsia Had a colonoscopy:  03/2017 *** 10/17/2021 PCP office visit *** She used daily Miralax for 14 days.  She is currently experiencing belching and flatulence.  She estimates to have daily BM.  Follow up with PCP as needed. Assessment/Plan:   1. Dyspepsia   Follow up with GI:  Dr Secundino Ginger placed.   - Ambulatory referral to Gastroenterology  2. Obesity, Current BMI 33.1 Tricia Miranda is currently in the action stage of change. As such, her goal is to continue with weight loss efforts. She has agreed to the Category 3 Plan.   Exercise goals:  As is.   Behavioral modification strategies: increasing lean protein intake, decreasing simple carbohydrates, meal planning and cooking strategies, keeping healthy foods in the home, and planning for success.  Tricia Miranda has agreed to follow-up with our clinic in 4 weeks. She was informed of the importance of frequent follow-up visits to maximize her success with intensive lifestyle modifications for her multiple health conditions.   Objective:   Blood pressure 124/85, pulse 60, temperature 98 F (36.7 C), height '5\' 7"'$  (1.702 m), weight 211 lb (95.7 kg), last menstrual period 11/15/2018, SpO2 98 %. Body mass index is 33.05 kg/m.  General: Cooperative, alert, well developed,  in no acute distress. HEENT: Conjunctivae and lids unremarkable. Cardiovascular: Regular rhythm.  Lungs: Normal work of breathing. Neurologic: No focal deficits.   Lab Results  Component Value Date   CREATININE 0.58 10/17/2021   BUN 19 10/17/2021   NA 141 10/17/2021   K 4.9 10/17/2021   CL 104 10/17/2021   CO2 29 10/17/2021   Lab Results  Component Value Date   ALT 17 10/17/2021   AST 14 10/17/2021   ALKPHOS 101 05/20/2021   BILITOT 0.5 10/17/2021   Lab Results  Component Value Date   HGBA1C 5.1 05/20/2021   HGBA1C 5.2 05/28/2020   HGBA1C 5.1 11/28/2019   HGBA1C 5.2 09/20/2014   HGBA1C 5.3 10/07/2010   Lab Results  Component Value Date   INSULIN 9.2 05/20/2021   INSULIN 6.9 06/18/2020   Lab Results  Component Value Date   TSH 1.29 10/17/2021   Lab Results  Component Value Date   CHOL 225 (H) 10/17/2021   HDL 68 10/17/2021   LDLCALC 139 (H) 10/17/2021   TRIG 83 10/17/2021   CHOLHDL 3.3 10/17/2021   Lab Results  Component Value Date   VD25OH 46.1 05/20/2021   VD25OH 40.1 11/06/2020   VD25OH 43 05/28/2020   Lab Results  Component Value Date   WBC 5.8 10/17/2021   HGB 14.0 10/17/2021   HCT 42.3 10/17/2021   MCV 85.5 10/17/2021   PLT 255 10/17/2021   Lab Results  Component Value Date   IRON 47 01/15/2017   TIBC 448 01/15/2017   FERRITIN 122 06/18/2020  Attestation Statements:   Reviewed by clinician on day of visit: allergies, medications, problem list, medical history, surgical history, family history, social history, and previous encounter notes.  Time spent on visit including pre-visit chart review and post-visit care and charting was 27 minutes.   I, Davy Pique, RMA, am acting as Location manager for Mina Marble, NP.  I have reviewed the above documentation for accuracy and completeness, and I agree with the above. -  ***

## 2021-12-05 ENCOUNTER — Telehealth: Payer: Self-pay

## 2021-12-05 DIAGNOSIS — M1712 Unilateral primary osteoarthritis, left knee: Secondary | ICD-10-CM

## 2021-12-05 DIAGNOSIS — M1711 Unilateral primary osteoarthritis, right knee: Secondary | ICD-10-CM

## 2021-12-05 NOTE — Telephone Encounter (Signed)
Talked with Tricia Miranda Intake at [facility] and was advised that No PA is required for Durolane, [phone]) Reference# Tricia Miranda8/18/2023,9:42am central time.

## 2021-12-10 ENCOUNTER — Ambulatory Visit: Payer: No Typology Code available for payment source | Admitting: Orthopedic Surgery

## 2021-12-12 ENCOUNTER — Ambulatory Visit
Admission: RE | Admit: 2021-12-12 | Discharge: 2021-12-12 | Disposition: A | Discharge status: Home / Self Care | Payer: No Typology Code available for payment source | Source: Ambulatory Visit | Attending: Obstetrics & Gynecology | Admitting: Obstetrics & Gynecology

## 2021-12-12 ENCOUNTER — Other Ambulatory Visit: Payer: Self-pay | Admitting: Internal Medicine

## 2021-12-12 DIAGNOSIS — Z1231 Encounter for screening mammogram for malignant neoplasm of breast: Secondary | ICD-10-CM

## 2021-12-15 ENCOUNTER — Other Ambulatory Visit: Payer: No Typology Code available for payment source

## 2021-12-16 ENCOUNTER — Ambulatory Visit: Payer: No Typology Code available for payment source | Admitting: Internal Medicine

## 2021-12-29 ENCOUNTER — Other Ambulatory Visit (INDEPENDENT_AMBULATORY_CARE_PROVIDER_SITE_OTHER): Payer: Self-pay | Admitting: Family Medicine

## 2021-12-29 DIAGNOSIS — E559 Vitamin D deficiency, unspecified: Secondary | ICD-10-CM

## 2022-01-05 ENCOUNTER — Ambulatory Visit (INDEPENDENT_AMBULATORY_CARE_PROVIDER_SITE_OTHER): Payer: No Typology Code available for payment source | Admitting: Internal Medicine

## 2022-01-05 ENCOUNTER — Other Ambulatory Visit (INDEPENDENT_AMBULATORY_CARE_PROVIDER_SITE_OTHER): Payer: Self-pay | Admitting: Family Medicine

## 2022-01-05 ENCOUNTER — Telehealth: Payer: Self-pay | Admitting: Internal Medicine

## 2022-01-05 DIAGNOSIS — R509 Fever, unspecified: Secondary | ICD-10-CM

## 2022-01-05 DIAGNOSIS — J069 Acute upper respiratory infection, unspecified: Secondary | ICD-10-CM | POA: Diagnosis not present

## 2022-01-05 DIAGNOSIS — H6500 Acute serous otitis media, unspecified ear: Secondary | ICD-10-CM | POA: Diagnosis not present

## 2022-01-05 DIAGNOSIS — E559 Vitamin D deficiency, unspecified: Secondary | ICD-10-CM

## 2022-01-05 MED ORDER — AZITHROMYCIN 250 MG PO TABS: ORAL_TABLET | ORAL | 0 refills | Status: AC

## 2022-01-05 MED ORDER — BENZONATATE 100 MG PO CAPS: 100.0000 mg | ORAL_CAPSULE | Freq: Three times a day (TID) | ORAL | 0 refills | Status: DC | PRN

## 2022-01-05 NOTE — Telephone Encounter (Signed)
COVID Negative, Car Visit sounds good

## 2022-01-05 NOTE — Telephone Encounter (Signed)
Tricia Miranda [phone]  Tricia Miranda called to say she started feeling bad on Friday night with scratchy throat, nasal drip, body aches, last night she had fever (102) She done a COVID test on Saturday that was negative. I have ask her to do another one a call me back with results. I scheduled her for 4:30, however waiting on results of today's COVID test to determine if in office, car visit or video

## 2022-01-06 ENCOUNTER — Other Ambulatory Visit: Payer: No Typology Code available for payment source

## 2022-01-06 ENCOUNTER — Ambulatory Visit (INDEPENDENT_AMBULATORY_CARE_PROVIDER_SITE_OTHER): Payer: No Typology Code available for payment source | Admitting: Adult Health

## 2022-01-06 ENCOUNTER — Telehealth (INDEPENDENT_AMBULATORY_CARE_PROVIDER_SITE_OTHER): Payer: Self-pay | Admitting: Adult Health

## 2022-01-06 ENCOUNTER — Other Ambulatory Visit: Payer: Self-pay

## 2022-01-06 DIAGNOSIS — R058 Other specified cough: Secondary | ICD-10-CM

## 2022-01-06 MED ORDER — ERGOCALCIFEROL 1.25 MG (50000 UT) PO CAPS: 50000.0000 [IU] | ORAL_CAPSULE | ORAL | 3 refills | Status: DC

## 2022-01-06 NOTE — Telephone Encounter (Signed)
Spoke with patient, she wasnt aware of our refill policy, but was very understanding. I told her that her refill would be discused at her next appointment.

## 2022-01-06 NOTE — Addendum Note (Signed)
Addended by: Geradine Girt D on: 01/06/2022 04:51 PM   Modules accepted: Orders

## 2022-01-06 NOTE — Addendum Note (Signed)
Addended by: Geradine Girt D on: 01/06/2022 04:50 PM   Modules accepted: Orders

## 2022-01-06 NOTE — Telephone Encounter (Signed)
09/19 Patient stated that she requested for her Vitamin D to be refill but was denied and wasnt sure why. She wants to make sure that he refill is going tp [employer]. JE

## 2022-01-07 ENCOUNTER — Encounter: Payer: Self-pay | Admitting: Internal Medicine

## 2022-01-07 ENCOUNTER — Ambulatory Visit: Payer: No Typology Code available for payment source | Admitting: Orthopedic Surgery

## 2022-01-07 NOTE — Patient Instructions (Addendum)
Please stay well-hydrated.  Rest at home for a couple of days.  Perhaps take 1 more COVID test at home.  Respiratory virus panel obtained.  This may take several days to return.  Take Zithromax Z-PAK 2 tabs day 1 followed by 1 tab days 2 through 5.  Tessalon Perles 100 mg up to 3 times daily as needed for cough.  Call if symptoms not improving within 3 to 4 days or sooner if worse.  Vitamin D supplementation refilled.

## 2022-01-07 NOTE — Progress Notes (Signed)
   Subjective:    Patient ID: Tricia Miranda, female    DOB: [birth year], 56 y.o.   MRN: [id number]  HPI 56 year old Female seen with scratchy throat, postnasal drip, myalgias and temp up to 102 degrees.  She did  a COVID test on Saturday, September 16th which was negative.  She did another COVID test today that was negative.  Patient has a history of hypertension, dyslipidemia, hypothyroidism, vitamin D deficiency, history of anxiety.    Review of Systems see above-no nausea vomiting or diarrhea     Objective:   Physical Exam  She is seen in person.  She is in no acute distress and has no tachypnea.  She is currently afebrile.  She sounds slightly nasally congested.  A respiratory virus panel was obtained.  Pharynx is very slightly injected without exudate.  TMs are basically clear.  Neck is supple without adenopathy.  Chest clear without rales or wheezing.      Assessment & Plan:  Acute upper respiratory infection  Acute serous otitis media  Plan: She was prescribed Zithromax Z-PAK 2 tabs day 1 followed by 1 tab days 2 through 5 and Tessalon Perles 100 mg up to 3 times daily as needed for cough.  Rest at home and drink plenty of fluids.  A respiratory virus panel was obtained but unfortunately there was an issue with proper transport of the specimen and patient will be asked to come back and have it repeated.  Addendum: January 06, 2022-the respiratory virus panel unfortunately was left overnight in the lab and not sent promptly to [facility] lab appropriately.  Patient was asked to come back and have repeat respiratory virus panel specimen sent which she did today.  Also she is requesting that her high-dose vitamin D refilled which we did.

## 2022-01-09 ENCOUNTER — Ambulatory Visit: Payer: No Typology Code available for payment source | Admitting: Internal Medicine

## 2022-01-09 LAB — RESPIRATORY VIRUS PANEL
Adenovirus B: NOT DETECTED
HUMAN PARAINFLU VIRUS 1: NOT DETECTED
HUMAN PARAINFLU VIRUS 2: NOT DETECTED
HUMAN PARAINFLU VIRUS 3: NOT DETECTED
INFLUENZA A SUBTYPE H1: NOT DETECTED
INFLUENZA A SUBTYPE H3: NOT DETECTED
Influenza A: NOT DETECTED
Influenza B: NOT DETECTED
Metapneumovirus: NOT DETECTED
Respiratory Syncytial Virus A: NOT DETECTED
Respiratory Syncytial Virus B: NOT DETECTED
Rhinovirus: DETECTED — AB

## 2022-01-14 ENCOUNTER — Ambulatory Visit (INDEPENDENT_AMBULATORY_CARE_PROVIDER_SITE_OTHER): Payer: No Typology Code available for payment source | Admitting: Obstetrics & Gynecology

## 2022-01-14 ENCOUNTER — Encounter: Payer: Self-pay | Admitting: Obstetrics & Gynecology

## 2022-01-14 VITALS — BP 116/80 | HR 86 | Ht 66.25 in | Wt 211.0 lb

## 2022-01-14 DIAGNOSIS — E6609 Other obesity due to excess calories: Secondary | ICD-10-CM

## 2022-01-14 DIAGNOSIS — N951 Menopausal and female climacteric states: Secondary | ICD-10-CM | POA: Diagnosis not present

## 2022-01-14 DIAGNOSIS — Z90710 Acquired absence of both cervix and uterus: Secondary | ICD-10-CM

## 2022-01-14 DIAGNOSIS — Z01419 Encounter for gynecological examination (general) (routine) without abnormal findings: Secondary | ICD-10-CM

## 2022-01-14 NOTE — Progress Notes (Signed)
Tricia Miranda [birth year] [id number]   History:    56 y.o.  G1P1L1 Divorced.  Son is Museum/gallery exhibitions officer at [employer].   RP:  Established patient presenting for annual gyn exam    HPI: TAH/Bilateral Salpingectomy 11/15/2018, patho benign.  No abdominopelvic pain.  No vaginal bleeding.  Pap Neg 11/2017, patho of cervix benign in 10/2018.  Will repeat Pap at 5 yrs in 2025.  Urine/BMs normal.  Breasts normal.  Mammo Neg 11/2021.  BMI 33.80.  Health labs with Fam MD.  [name] 03/2017.  Past medical history,surgical history, family history and social history were all reviewed and documented in the EPIC chart.  Gynecologic History Patient's last menstrual period was 11/15/2018.  Obstetric History OB History  Gravida Para Term Preterm AB Living  '1 1 1     1  '$ SAB IAB Ectopic Multiple Live Births               # Outcome Date GA Lbr Len/2nd Weight Sex Delivery Anes PTL Lv  1 Term              ROS: A ROS was performed and pertinent positives and negatives are included in the history. GENERAL: No fevers or chills. HEENT: No change in vision, no earache, sore throat or sinus congestion. NECK: No pain or stiffness. CARDIOVASCULAR: No chest pain or pressure. No palpitations. PULMONARY: No shortness of breath, cough or wheeze. GASTROINTESTINAL: No abdominal pain, nausea, vomiting or diarrhea, melena or bright red blood per rectum. GENITOURINARY: No urinary frequency, urgency, hesitancy or dysuria. MUSCULOSKELETAL: No joint or muscle pain, no back pain, no recent trauma. DERMATOLOGIC: No rash, no itching, no lesions. ENDOCRINE: No polyuria, polydipsia, no heat or cold intolerance. No recent change in weight. HEMATOLOGICAL: No anemia or easy bruising or bleeding. NEUROLOGIC: No headache, seizures, numbness, tingling or weakness. PSYCHIATRIC: No depression, no loss of interest in normal activity or change in sleep pattern.     Exam:   BP 116/80   Pulse 86   Ht 5' 6.25" (1.683 m)   Wt 211 lb (95.7 kg)   LMP  11/15/2018   SpO2 98%   BMI 33.80 kg/m   Body mass index is 33.8 kg/m.  General appearance : Well developed well nourished female. No acute distress HEENT: Eyes: no retinal hemorrhage or exudates,  Neck supple, trachea midline, no carotid bruits, no thyroidmegaly Lungs: Clear to auscultation, no rhonchi or wheezes, or rib retractions  Heart: Regular rate and rhythm, no murmurs or gallops Breast:Examined in sitting and supine position were symmetrical in appearance, no palpable masses or tenderness,  no skin retraction, no nipple inversion, no nipple discharge, no skin discoloration, no axillary or supraclavicular lymphadenopathy Abdomen: no palpable masses or tenderness, no rebound or guarding Extremities: no edema or skin discoloration or tenderness  Pelvic: Vulva: Normal             Vagina: No gross lesions or discharge  Cervix/Uterus absent  Adnexa  Without masses or tenderness  Anus: Normal   Assessment/Plan:  56 y.o. female for annual exam   1. Well female exam with routine gynecological exam TAH/Bilateral Salpingectomy 11/15/2018, patho benign.  No abdominopelvic pain.  No vaginal bleeding. Pap Neg 11/2017, patho of cervix benign in 10/2018.  Will repeat Pap at 5 yrs in 2025.  Urine/BMs normal.  Breasts normal.  Mammo Neg 11/2021.  BMI 33.80.  Health labs with Fam MD.  [name] 03/2017.  2. S/P TAH (total abdominal hysterectomy)  3. Perimenopause TAH/Bilateral  Salpingectomy 11/15/2018.  No vasomotor menopausal Sx.  Would like to know if menopausal, considering HRT.  [place] today. - FSH  4. Class 1 obesity due to excess calories with serious comorbidity and body mass index (BMI) of 33.0 to 33.9 in adult Low calorie/carb diet.  Increase fitness activities.  Other orders - loratadine (CLARITIN) 10 MG tablet; Take 10 mg by mouth daily.   [name] MD, 4:34 PM 01/14/2022

## 2022-01-15 LAB — FOLLICLE STIMULATING HORMONE: FSH: 23.4 m[IU]/mL

## 2022-01-16 ENCOUNTER — Encounter: Payer: Self-pay | Admitting: Surgical

## 2022-01-16 ENCOUNTER — Ambulatory Visit (INDEPENDENT_AMBULATORY_CARE_PROVIDER_SITE_OTHER): Payer: No Typology Code available for payment source | Admitting: Surgical

## 2022-01-16 DIAGNOSIS — M170 Bilateral primary osteoarthritis of knee: Secondary | ICD-10-CM

## 2022-01-16 DIAGNOSIS — M1711 Unilateral primary osteoarthritis, right knee: Secondary | ICD-10-CM

## 2022-01-16 DIAGNOSIS — M1712 Unilateral primary osteoarthritis, left knee: Secondary | ICD-10-CM | POA: Diagnosis not present

## 2022-01-16 MED ORDER — SODIUM HYALURONATE 60 MG/3ML IX PRSY
60.0000 mg | PREFILLED_SYRINGE | INTRA_ARTICULAR | Status: AC | PRN
  Administered 2022-01-16: 60 mg via INTRA_ARTICULAR

## 2022-01-16 MED ORDER — LIDOCAINE HCL 1 % IJ SOLN
5.0000 mL | INTRAMUSCULAR | Status: AC | PRN
  Administered 2022-01-16: 5 mL

## 2022-01-16 NOTE — Progress Notes (Signed)
   Procedure Note  Patient: Tricia Miranda             Date of Birth: 11/22/1965           MRN: [id number]             Visit Date: 01/16/2022  Procedures: Visit Diagnoses: No diagnosis found.  Large Joint Inj: bilateral knee on 01/16/2022 9:29 PM Indications: diagnostic evaluation, joint swelling and pain Details: 18 G 1.5 in needle, superolateral approach  Arthrogram: No  Medications (Right): 5 mL lidocaine 1 %; 60 mg Sodium Hyaluronate 60 MG/3ML Medications (Left): 5 mL lidocaine 1 %; 60 mg Sodium Hyaluronate 60 MG/3ML Outcome: tolerated well, no immediate complications Procedure, treatment alternatives, risks and benefits explained, specific risks discussed. Consent was given by the patient. Immediately prior to procedure a time out was called to verify the correct patient, procedure, equipment, support staff and site/side marked as required. Patient was prepped and draped in the usual sterile fashion.

## 2022-01-27 ENCOUNTER — Other Ambulatory Visit: Payer: No Typology Code available for payment source

## 2022-01-27 DIAGNOSIS — E7849 Other hyperlipidemia: Secondary | ICD-10-CM

## 2022-01-27 DIAGNOSIS — R7989 Other specified abnormal findings of blood chemistry: Secondary | ICD-10-CM

## 2022-01-27 DIAGNOSIS — E038 Other specified hypothyroidism: Secondary | ICD-10-CM

## 2022-01-28 ENCOUNTER — Encounter (INDEPENDENT_AMBULATORY_CARE_PROVIDER_SITE_OTHER): Payer: Self-pay | Admitting: Adult Health

## 2022-01-28 ENCOUNTER — Ambulatory Visit (INDEPENDENT_AMBULATORY_CARE_PROVIDER_SITE_OTHER): Payer: No Typology Code available for payment source | Admitting: Adult Health

## 2022-01-28 VITALS — BP 101/73 | HR 71 | Temp 98.0°F | Ht 67.0 in | Wt 211.0 lb

## 2022-01-28 DIAGNOSIS — Z6833 Body mass index (BMI) 33.0-33.9, adult: Secondary | ICD-10-CM

## 2022-01-28 DIAGNOSIS — E559 Vitamin D deficiency, unspecified: Secondary | ICD-10-CM

## 2022-01-28 DIAGNOSIS — Z6835 Body mass index (BMI) 35.0-35.9, adult: Secondary | ICD-10-CM

## 2022-01-28 DIAGNOSIS — E669 Obesity, unspecified: Secondary | ICD-10-CM

## 2022-01-28 LAB — LIPID PANEL
Cholesterol: 216 mg/dL — ABNORMAL HIGH (ref <200)
HDL: 60 mg/dL (ref >=50)
LDL Cholesterol (Calc): 128 mg/dL (calc) — ABNORMAL HIGH
Non-HDL Cholesterol (Calc): 156 mg/dL (calc) — ABNORMAL HIGH (ref <130)
Total CHOL/HDL Ratio: 3.6 (calc) (ref <5.0)
Triglycerides: 160 mg/dL — ABNORMAL HIGH (ref <150)

## 2022-01-28 LAB — TSH: TSH: 1.71 mIU/L (ref 0.40–4.50)

## 2022-01-30 ENCOUNTER — Encounter: Payer: Self-pay | Admitting: Internal Medicine

## 2022-01-30 ENCOUNTER — Ambulatory Visit: Payer: No Typology Code available for payment source | Admitting: Internal Medicine

## 2022-01-30 VITALS — BP 130/84 | HR 66 | Temp 97.8°F | Ht 67.0 in | Wt 216.8 lb

## 2022-01-30 DIAGNOSIS — E039 Hypothyroidism, unspecified: Secondary | ICD-10-CM

## 2022-01-30 DIAGNOSIS — E7800 Pure hypercholesterolemia, unspecified: Secondary | ICD-10-CM

## 2022-01-30 DIAGNOSIS — E7849 Other hyperlipidemia: Secondary | ICD-10-CM

## 2022-01-30 DIAGNOSIS — E559 Vitamin D deficiency, unspecified: Secondary | ICD-10-CM | POA: Diagnosis not present

## 2022-01-30 DIAGNOSIS — Z6833 Body mass index (BMI) 33.0-33.9, adult: Secondary | ICD-10-CM

## 2022-01-30 DIAGNOSIS — Z7185 Encounter for immunization safety counseling: Secondary | ICD-10-CM

## 2022-01-30 DIAGNOSIS — R109 Unspecified abdominal pain: Secondary | ICD-10-CM

## 2022-01-30 NOTE — Progress Notes (Signed)
   Subjective:    Patient ID: Tricia Miranda, female    DOB: 20-Aug-1965, 56 y.o.   MRN: [id number]  HPI 56 year old Female seen for hypothyroidism follow up. TSH is normal.  Goes to [facility] for weight loss. Has been traveling recently. Is on high protein plan.  Sees  Is concerned as to whether or not she needs Varicella or Shingrix vaccine.  Is not on statin medication at present. Dr, hilty has you on Zetia.  Review of Systems Patient had some obstipation symptoms, nausea and constipation. Tried Miralax which seemed to help.      Objective:   Physical Exam        Assessment & Plan:

## 2022-01-31 NOTE — Patient Instructions (Signed)
Referral back to [facility] at patient request due to left sided abdominal pain and irritable bowel issues. Continue Zetia and thyroid replacement meds.Yes she should get Zoster vaccine(shingle) see note above explaining why. RTC in 6 months for wellness visit.

## 2022-02-03 NOTE — Progress Notes (Signed)
Chief Complaint:   OBESITY Flordia is here to discuss her progress with her obesity treatment plan along with follow-up of her obesity related diagnoses. Naviyah is on the Category 3 Plan and states she is following her eating plan approximately 75-80% of the time. Jacoya states she is walking 10,000-15,000 steps daily 2 times per week.   Today's visit was #: 24 Starting weight: 224 lbs Starting date: 06/18/2020 Today's weight: 211 lbs Today's date: 01/28/2022 Total lbs lost to date: 13 lbs Total lbs lost since last in-office visit: 0  Interim History:  Since her last office visit  Ms. Reitano has been out of town for training. 3.5 week in [place], home for one week then one week in [place].   When out of local area she reports eating almost exclusively at restaurants.    She is walking 15,000 steps on weekends.   Subjective:   1. Vitamin D deficiency 04/22/2021, Vitamin D level 46.1.  PCP recently refilled ergocalciferol. She endorses stable energy levels.   Assessment/Plan:   1. Vitamin D deficiency Continue weekly ergocalciferol per PCP.   2. Obesity, Current BMI 33.1 Handout:  Multiple recipe guides.   Amiria is currently in the action stage of change. As such, her goal is to continue with weight loss efforts. She has agreed to the Category 3 Plan.   Exercise goals:  As is.   Behavioral modification strategies: increasing lean protein intake, decreasing simple carbohydrates, meal planning and cooking strategies, keeping healthy foods in the home, and planning for success.  Iolanda has agreed to follow-up with our clinic in 3-4 weeks. She was informed of the importance of frequent follow-up visits to maximize her success with intensive lifestyle modifications for her multiple health conditions.   Objective:   Blood pressure 101/73, pulse 71, temperature 98 F (36.7 C), height '5\' 7"'$  (1.702 m), weight 211 lb (95.7 kg), last menstrual period 11/15/2018, SpO2 97  %. Body mass index is 33.05 kg/m.  General: Cooperative, alert, well developed, in no acute distress. HEENT: Conjunctivae and lids unremarkable. Cardiovascular: Regular rhythm.  Lungs: Normal work of breathing. Neurologic: No focal deficits.   Lab Results  Component Value Date   CREATININE 0.58 10/17/2021   BUN 19 10/17/2021   NA 141 10/17/2021   K 4.9 10/17/2021   CL 104 10/17/2021   CO2 29 10/17/2021   Lab Results  Component Value Date   ALT 17 10/17/2021   AST 14 10/17/2021   ALKPHOS 101 05/20/2021   BILITOT 0.5 10/17/2021   Lab Results  Component Value Date   HGBA1C 5.1 05/20/2021   HGBA1C 5.2 05/28/2020   HGBA1C 5.1 11/28/2019   HGBA1C 5.2 09/20/2014   HGBA1C 5.3 10/07/2010   Lab Results  Component Value Date   INSULIN 9.2 05/20/2021   INSULIN 6.9 06/18/2020   Lab Results  Component Value Date   TSH 1.71 01/27/2022   Lab Results  Component Value Date   CHOL 216 (H) 01/27/2022   HDL 60 01/27/2022   LDLCALC 128 (H) 01/27/2022   TRIG 160 (H) 01/27/2022   CHOLHDL 3.6 01/27/2022   Lab Results  Component Value Date   VD25OH 46.1 05/20/2021   VD25OH 40.1 11/06/2020   VD25OH 43 05/28/2020   Lab Results  Component Value Date   WBC 5.8 10/17/2021   HGB 14.0 10/17/2021   HCT 42.3 10/17/2021   MCV 85.5 10/17/2021   PLT 255 10/17/2021   Lab Results  Component Value Date  IRON 47 01/15/2017   TIBC 448 01/15/2017   FERRITIN 122 06/18/2020    Attestation Statements:   Reviewed by clinician on day of visit: allergies, medications, problem list, medical history, surgical history, family history, social history, and previous encounter notes.  Total time spent chart review, pre-charting, face to face interaction, and post charting was approximately 26 minutes.  I, Davy Pique, RMA, am acting as Location manager for Mina Marble, NP.  I have reviewed the above documentation for accuracy and completeness, and I agree with the above. -  Adria Costley d. Shanisha Lech,  NP-C

## 2022-02-23 ENCOUNTER — Encounter (INDEPENDENT_AMBULATORY_CARE_PROVIDER_SITE_OTHER): Payer: Self-pay | Admitting: Family Medicine

## 2022-02-23 ENCOUNTER — Ambulatory Visit (INDEPENDENT_AMBULATORY_CARE_PROVIDER_SITE_OTHER): Payer: No Typology Code available for payment source | Admitting: Family Medicine

## 2022-02-23 VITALS — BP 117/84 | HR 57 | Temp 98.0°F | Ht 67.0 in | Wt 210.2 lb

## 2022-02-23 DIAGNOSIS — E7849 Other hyperlipidemia: Secondary | ICD-10-CM

## 2022-02-23 DIAGNOSIS — E559 Vitamin D deficiency, unspecified: Secondary | ICD-10-CM | POA: Diagnosis not present

## 2022-02-23 DIAGNOSIS — E538 Deficiency of other specified B group vitamins: Secondary | ICD-10-CM

## 2022-02-23 DIAGNOSIS — F411 Generalized anxiety disorder: Secondary | ICD-10-CM

## 2022-02-23 DIAGNOSIS — E669 Obesity, unspecified: Secondary | ICD-10-CM

## 2022-02-23 DIAGNOSIS — Z6832 Body mass index (BMI) 32.0-32.9, adult: Secondary | ICD-10-CM

## 2022-02-23 MED ORDER — ERGOCALCIFEROL 1.25 MG (50000 UT) PO CAPS: 50000.0000 [IU] | ORAL_CAPSULE | ORAL | 0 refills | Status: DC

## 2022-03-04 LAB — LIPID PANEL
Chol/HDL Ratio: 3.4 ratio (ref 0.0–4.4)
Cholesterol, Total: 210 mg/dL — ABNORMAL HIGH (ref 100–199)
HDL: 62 mg/dL (ref >39)
LDL Chol Calc (NIH): 127 mg/dL — ABNORMAL HIGH (ref 0–99)
Triglycerides: 116 mg/dL (ref 0–149)
VLDL Cholesterol Cal: 21 mg/dL (ref 5–40)

## 2022-03-08 NOTE — Progress Notes (Signed)
Chief Complaint:   OBESITY Tricia Miranda is here to discuss her progress with her obesity treatment plan along with follow-up of her obesity related diagnoses. Tricia Miranda is on the Category 3 Plan and states she is following her eating plan approximately 93% of the time. Tricia Miranda states she is not currently exercising.  Today's visit was #: 25 Starting weight: 224 lbs Starting date: 06/18/2020 Today's weight: 210 lbs Today's date: 02/23/2022 Total lbs lost to date: 14 Total lbs lost since last in-office visit: 1  Interim History: Tricia Miranda normally sees NP [place]. Her last OV was 04/30/2021. She has new job, which is causing anxiety. Pt also has increased snacking in the late afternoon and after dinner. She is struggling with meal prep and eats out for lunch 3-4 days a week, and dinner 2-3 times a week on average due to work.  Subjective:   1. GAD (generalized anxiety disorder) Tricia Miranda sees a counselor for CBT and recently saw psychiatrist, as well. She is getting a work-up for anxiety. Pt has questions about various genetic folate abnormalities that her psychiatrist tested her for.  2. Other hyperlipidemia She is on Zetia and has no concerns.  3. Vitamin D deficiency Pt takes weekly Vitamin D dose on Monday's and is tolerating it well without side effects.  4. Vitamin B12 deficiency Tricia Miranda takes OTC B12 1,000 mcg daily.  Assessment/Plan:  No orders of the defined types were placed in this encounter.   Medications Discontinued During This Encounter  Medication Reason   ergocalciferol (DRISDOL) 1.25 MG (50000 UT) capsule Reorder     Meds ordered this encounter  Medications   ergocalciferol (DRISDOL) 1.25 MG (50000 UT) capsule    Sig: Take 1 capsule (50,000 Units total) by mouth once a week.    Dispense:  4 capsule    Refill:  0     1. GAD (generalized anxiety disorder) Pt advised that she will have to ask psychiatrist about tests and why/how it affects her mood. Medication management per  psych.  2. Other hyperlipidemia Cardiovascular risk and specific lipid/LDL goals reviewed.  We discussed several lifestyle modifications today and Tricia Miranda will continue to work on diet, exercise and weight loss efforts. Orders and follow up as documented in patient record.  Continue prudent nutritional plan with low saturated and trans fats. Increase exercise and start walking.  Counseling Intensive lifestyle modifications are the first line treatment for this issue. Dietary changes: Increase soluble fiber. Decrease simple carbohydrates. Exercise changes: Moderate to vigorous-intensity aerobic activity 150 minutes per week if tolerated. Lipid-lowering medications: see documented in medical record.  3. Vitamin D deficiency Low Vitamin D level contributes to fatigue and are associated with obesity, breast, and colon cancer. She agrees to continue to take prescription Vitamin D 50,000 IU every week and will follow-up for routine testing of Vitamin D, at least 2-3 times per year to avoid over-replacement.  Refill- ergocalciferol (DRISDOL) 1.25 MG (50000 UT) capsule; Take 1 capsule (50,000 Units total) by mouth once a week.  Dispense: 4 capsule; Refill: 0  4. Vitamin B12 deficiency The diagnosis was reviewed with the patient. Counseling provided today, see below. We will continue to monitor. Orders and follow up as documented in patient record. Consider rechecking labs at next OV. Continue prudent nutritional plan and increase exercise, if tolerated.  Counseling The body needs vitamin B12: to make red blood cells; to make DNA; and to help the nerves work properly so they can carry messages from the brain to the  body.  The main causes of vitamin B12 deficiency include dietary deficiency, digestive diseases, pernicious anemia, and having a surgery in which part of the stomach or small intestine is removed.  Certain medicines can make it harder for the body to absorb vitamin B12. These medicines  include: heartburn medications; some antibiotics; some medications used to treat diabetes, gout, and high cholesterol.  In some cases, there are no symptoms of this condition. If the condition leads to anemia or nerve damage, various symptoms can occur, such as weakness or fatigue, shortness of breath, and numbness or tingling in your hands and feet.   Treatment:  May include taking vitamin B12 supplements.  Avoid alcohol.  Eat lots of healthy foods that contain vitamin B12: Beef, pork, chicken, [place], and organ meats, such as liver.  Seafood: This includes clams, rainbow trout, salmon, tuna, and haddock. Eggs.  Cereal and dairy products that are fortified: This means that vitamin B12 has been added to the food.   5. Obesity, Current BMI 32.9 Alizandra is currently in the action stage of change. As such, her goal is to continue with weight loss efforts. She has agreed to the Category 3 Plan.   Handout: Slow Cooker Meals Exercise goals: All adults should avoid inactivity. Some physical activity is better than none, and adults who participate in any amount of physical activity gain some health benefits.  Behavioral modification strategies: meal planning and cooking strategies, holiday eating strategies , and planning for success.  Tricia Miranda has agreed to follow-up with our clinic in 3 weeks and consider repeating labs. She was informed of the importance of frequent follow-up visits to maximize her success with intensive lifestyle modifications for her multiple health conditions.   Objective:   Blood pressure 117/84, pulse (!) 57, temperature 98 F (36.7 C), height 5\' 7"  (1.702 m), weight 210 lb 3.2 oz (95.3 kg), last menstrual period 11/15/2018, SpO2 96 %. Body mass index is 32.92 kg/m.  General: Cooperative, alert, well developed, in no acute distress. HEENT: Conjunctivae and lids unremarkable. Cardiovascular: Regular rhythm.  Lungs: Normal work of breathing. Neurologic: No focal deficits.    Lab Results  Component Value Date   CREATININE 0.58 10/17/2021   BUN 19 10/17/2021   NA 141 10/17/2021   K 4.9 10/17/2021   CL 104 10/17/2021   CO2 29 10/17/2021   Lab Results  Component Value Date   ALT 17 10/17/2021   AST 14 10/17/2021   ALKPHOS 101 05/20/2021   BILITOT 0.5 10/17/2021   Lab Results  Component Value Date   HGBA1C 5.1 05/20/2021   HGBA1C 5.2 05/28/2020   HGBA1C 5.1 11/28/2019   HGBA1C 5.2 09/20/2014   HGBA1C 5.3 10/07/2010   Lab Results  Component Value Date   INSULIN 9.2 05/20/2021   INSULIN 6.9 06/18/2020   Lab Results  Component Value Date   TSH 1.71 01/27/2022   Lab Results  Component Value Date   CHOL 210 (H) 03/03/2022   HDL 62 03/03/2022   LDLCALC 127 (H) 03/03/2022   TRIG 116 03/03/2022   CHOLHDL 3.4 03/03/2022   Lab Results  Component Value Date   VD25OH 46.1 05/20/2021   VD25OH 40.1 11/06/2020   VD25OH 43 05/28/2020   Lab Results  Component Value Date   WBC 5.8 10/17/2021   HGB 14.0 10/17/2021   HCT 42.3 10/17/2021   MCV 85.5 10/17/2021   PLT 255 10/17/2021   Lab Results  Component Value Date   IRON 47 01/15/2017   TIBC  448 01/15/2017   FERRITIN 122 06/18/2020    Attestation Statements:   Reviewed by clinician on day of visit: allergies, medications, problem list, medical history, surgical history, family history, social history, and previous encounter notes.  I, Kyung Rudd, BS, CMA, am acting as transcriptionist for [employer], DO.   I have reviewed the above documentation for accuracy and completeness, and I agree with the above. Carlye Grippe, D.O.  The 21st Century Cures Act was signed into law in 2016 which includes the topic of electronic health records.  This provides immediate access to information in MyChart.  This includes consultation notes, operative notes, office notes, lab results and pathology reports.  If you have any questions about what you read please let us know at your next visit  so we can discuss your concerns and take corrective action if need be.  We are right here with you.

## 2022-03-11 ENCOUNTER — Encounter: Payer: Self-pay | Admitting: Internal Medicine

## 2022-03-11 ENCOUNTER — Ambulatory Visit: Payer: No Typology Code available for payment source | Attending: Internal Medicine | Admitting: Internal Medicine

## 2022-03-11 VITALS — Ht 67.0 in | Wt 210.0 lb

## 2022-03-11 DIAGNOSIS — T466X5D Adverse effect of antihyperlipidemic and antiarteriosclerotic drugs, subsequent encounter: Secondary | ICD-10-CM

## 2022-03-11 DIAGNOSIS — M791 Myalgia, unspecified site: Secondary | ICD-10-CM | POA: Diagnosis not present

## 2022-03-11 DIAGNOSIS — E782 Mixed hyperlipidemia: Secondary | ICD-10-CM

## 2022-03-11 DIAGNOSIS — E785 Hyperlipidemia, unspecified: Secondary | ICD-10-CM

## 2022-03-11 NOTE — Progress Notes (Signed)
Virtual Visit via Video Note   Because of Raeonna Milo Youngblood's co-morbid illnesses, she is at least at moderate risk for complications without adequate follow up.  This format is felt to be most appropriate for this patient at this time.  All issues noted in this document were discussed and addressed.  A limited physical exam was performed with this format.  Please refer to the patient's chart for her consent to telehealth for [facility].      Date:  03/11/2022   ID:  Claudine Mouton Sides, DOB [birth year], MRN [id number] The patient was identified using 2 identifiers.  Evaluation Performed:  Follow-Up Visit  Patient Location:  [address] [place] [address]  Provider location:   [address], [place], [place] [address]  PCP:  Elby Showers, MD  Cardiologist:  None Electrophysiologist:  None   Chief Complaint:  Follow-up  History of Present Illness:    Aileene Lanum is a 56 y.o. female who presents via audio/video conferencing for a telehealth visit today.  This is a pleasant female kindly referred for evaluation management of dyslipidemia.  Her PCP had referred her due to progressively worsening dyslipidemia for management recommendations.  According to Ms. Charlie Pitter she has had longstanding issues with high cholesterol and had previously at one point been on simvastatin which she said caused significant joint aches and feeling of overall muscle soreness.  Subsequently in the past had been changed over to rosuvastatin 5 mg daily which she said she took for a number of years without any significant side effects but then started to develop more significant muscle and joint aches including feelings of muscle pulsations.  Her rosuvastatin was then decreased to a point where she was only on it twice weekly.  Her LDL a year ago was 115 but has since risen and currently is 141, total cholesterol 229, HDL 50 and triglycerides 249.  This data was 3 months ago and she  reports that she has been off of her statin medication since that time so I expect that her cholesterol may be higher.  Fortunately, it appears that she has few other cardiovascular risk factors.  She does not have any diabetes, hypertension, known coronary disease or prior to cardiovascular events.  There is heart disease in her father who had two-vessel bypass but was a smoker and her mother reportedly had very high cholesterol she says in the 400s and was on medication for a while but then was told to come off of it after heart catheterization showed no significant coronary disease but subsequently then was restarted on it.  She reports a variable but generally low saturated fat diet with fish, chicken and vegetables and other healthy sources.   06/19/2020   Mrs. Queenan returns today for follow-up. She underwent coronary calcium scoring in November which showed 0 coronary calcium however she was noted to have hepatic steatosis. She has been working on weight loss and was referred to the [facility] health weight management center. She did have repeat lipids however they are slightly higher. Total cholesterol now 239, HDL 56, triglycerides 151 and LDL 155 (up from 143). She said is actually been a difficult couple of months having to care for her husband who injured his leg while fishing.   12/17/2020  Mrs. Diloreto is seen today in follow-up.  She has been working with [employer] management for weight loss.  She says she is down about 24 pounds.  Today weight was 213  pounds.  Despite this, her cholesterol has increased.  Total cholesterol was 251 in July with HDL 58, triglycerides 122 (this is reduced) and LDL 171.  She still feels that she is able to get her cholesterol lower with just diet alone.  She mentioned that her cholesterol was quite low back when she weighed 150 pounds a number of years ago.  Since she had no coronary artery calcium, it is felt that she is not at a high short-term risk of not being on  medication.   05/30/2021  Mrs. Heart returns today for follow-up.  She is recently seen her primary care provider who had highly encouraged her to consider management of her lipids.  Her cholesterol was retested and the LDL is gone up from 171-178, total cholesterol 265, triglycerides 100 and HDL 70.  She had reported as previously mentioned that her mother had very high cholesterol but actually had no coronary disease by cath.  She may have a mutation in APO B which can sometimes be associated with lower cardiovascular risk.  Nonetheless I do suspect this is a genetic cholesterol disorder.  She is also recently been seeking treatment for ADHD and had undergone genetic and possibly pharmacogenetics testing which is pending.  We discussed some other alternative therapies to the statins.   09/08/2021   Mrs. Dionisio is seen today in follow-up.  Overall she has had some response to ezetimibe.  She seems to be tolerating it well.  She reports she does not always remember to take it on a daily basis but at least takes it every other day.  Numbers may otherwise improve if she were to take it daily.  Her cholesterol has come down with total 211, down from 265 and LDL 133 down from 178.  She is also made some dietary changes and increase her exercise.  03/11/2022  Ms. Camberos is seen today for follow-up.  She had lipids in October from her PCP which showed elevated triglycerides however she said she had just come back from a conference and had been eating a lot of food that was not normal for her.  I advised repeating her lipids again in October prior to this visit.  Not surprisingly her triglycerides had improved.  Total cholesterol 210, triglycerides 116, HDL 62 and LDL 127.  She seems to be tolerating ezetimibe.  She had no coronary calcium as mentioned previously.  Prior CV studies:   The following studies were reviewed today:  Lab work  PMHx:  Past Medical History:  Diagnosis Date   Anxiety    Anxiety     Bronchitis    Cancer ([place])    basal cell- tiny   Cancer ([place])    Fibroids    uterine    High cholesterol    Hypothyroidism    Joint pain    Low iron    Low vitamin D level    Osteoarthritis    Sleep apnea    uses oral device for mild sleep apnea    Thyroid disease    hypothyroidism   Vitamin B deficiency     Past Surgical History:  Procedure Laterality Date   ABDOMINAL HYSTERECTOMY     COLONOSCOPY     with benign  polyp removal    DILITATION & CURRETTAGE/HYSTROSCOPY WITH VERSAPOINT RESECTION N/A 03/16/2014   Procedure: DILATATION & CURETTAGE/HYSTEROSCOPY WITH VERSAPOINT RESECTION;  Surgeon: [name], MD;  Location: [facility];  Service: Gynecology;  Laterality: N/A;  1 hr.   HYSTERECTOMY ABDOMINAL  WITH SALPINGECTOMY Bilateral 11/15/2018   Procedure: HYSTERECTOMY ABDOMINAL WITH  BILATERAL SALPINGECTOMY;  Surgeon: [name], MD;  Location: [facility];  Service: Gynecology;  Laterality: Bilateral;   MOHS SURGERY Left a while- unknown   under left eyelid   WISDOM TOOTH EXTRACTION      FAMHx:  Family History  Problem Relation Age of Onset   Hypertension Mother    High Cholesterol Mother    Sleep apnea Mother    Heart disease Father    Alcohol abuse Father    Anxiety disorder Father    Stomach cancer Other    Colon cancer Neg Hx    Esophageal cancer Neg Hx    Pancreatic cancer Neg Hx    Prostate cancer Neg Hx    Rectal cancer Neg Hx     SOCHx:   reports that she has never smoked. She has never used smokeless tobacco. She reports current alcohol use. She reports that she does not use drugs.  ALLERGIES:  Allergies  Allergen Reactions   Cat Hair Extract Other (See Comments)    MEDS:  Current Meds  Medication Sig   ALPRAZolam (XANAX) 0.25 MG tablet TAKE ONE TABLET BY MOUTH TWICE A DAY AS NEEDED   benzonatate (TESSALON PERLES) 100 MG capsule Take 1 capsule (100 mg total) by mouth 3 (three) times daily as needed for cough.    cyanocobalamin 1000 MCG tablet Take 1,000 mcg by mouth daily.   ergocalciferol (DRISDOL) 1.25 MG (50000 UT) capsule Take 1 capsule (50,000 Units total) by mouth once a week.   FLUoxetine (PROZAC) 20 MG capsule Take 20 mg by mouth daily.   loratadine (CLARITIN) 10 MG tablet Take 10 mg by mouth daily as needed.   meloxicam (MOBIC) 15 MG tablet TAKE ONE TABLET BY MOUTH DAILY (Patient taking differently: Take 15 mg by mouth as needed.)   propranolol (INDERAL) 10 MG tablet Take 10 mg by mouth as needed.   SYNTHROID 100 MCG tablet TAKE ONE TABLET BY MOUTH DAILY BEFORE A MEAL     ROS: Pertinent items noted in HPI and remainder of comprehensive ROS otherwise negative.  Labs/Other Tests and Data Reviewed:    Recent Labs: 10/17/2021: ALT 17; BUN 19; Creat 0.58; Hemoglobin 14.0; Platelets 255; Potassium 4.9; Sodium 141 01/27/2022: TSH 1.71   Recent Lipid Panel Lab Results  Component Value Date/Time   CHOL 210 (H) 03/03/2022 12:03 PM   TRIG 116 03/03/2022 12:03 PM   HDL 62 03/03/2022 12:03 PM   CHOLHDL 3.4 03/03/2022 12:03 PM   CHOLHDL 3.6 01/27/2022 12:13 PM   LDLCALC 127 (H) 03/03/2022 12:03 PM   LDLCALC 128 (H) 01/27/2022 12:13 PM    Wt Readings from Last 3 Encounters:  03/11/22 210 lb (95.3 kg)  02/23/22 210 lb 3.2 oz (95.3 kg)  01/30/22 216 lb 12.8 oz (98.3 kg)     Exam:    Vital Signs:  Ht '5\' 7"'$  (1.702 m)   Wt 210 lb (95.3 kg)   LMP 11/15/2018   BMI 32.89 kg/m    General appearance: alert and no distress Lungs: No wheezes Abdomen: Mildly overweight Extremities: extremities normal, atraumatic, no cyanosis or edema Neurologic: Grossly normal  ASSESSMENT & PLAN:    Mixed dyslipidemia Statin intolerance - myalgias Family history of high cholesterol Moderate obesity Zero CAC (02/2020)-hepatic steatosis  Overall Ms. Charlie Pitter is doing well on Zetia.  Her lipids are a bit lower than they had been previously.  Other options are limited for her since  she cannot take statins and  with 0 coronary calcium would not qualify for PCSK9 inhibitors, Nexletol or other therapies.  We could consider adding plant Fida sterols to her diet which she is interested in.  I would like to see her LDL below 100, but will continue to try to optimize diet and physical activity for this.  Follow-up with me in a repeat lipid in 6 months.  Patient Risk:   After full review of this patients clinical status, I feel that they are at least moderate risk at this time.  Time:   Today, I have spent 15 minutes with the patient with telehealth technology discussing dyslipidemia.     Medication Adjustments/Labs and Tests Ordered: Current medicines are reviewed at length with the patient today.  Concerns regarding medicines are outlined above.   Tests Ordered: Orders Placed This Encounter  Procedures   Lipid panel    Medication Changes: No orders of the defined types were placed in this encounter.   Disposition:  in 6 month(s)  Pixie Casino, MD, [facility], [place] Director of the Advanced Lipid Disorders &  Cardiovascular Risk Reduction Clinic Diplomate of the American Board of Clinical Lipidology Attending Cardiologist  Direct Dial: [phone]  Fax: [phone]  Website:  [email or web address]  Pixie Casino, MD  03/11/2022 9:39 AM

## 2022-03-11 NOTE — Patient Instructions (Addendum)
Medication Instructions:  START nature made cholestoff over-the-counter as directed https://www.naturemade.com/products/cholestoff-plus?[email or web address]&queryID=[email or web address]  *If you need a refill on your cardiac medications before your next appointment, please call your pharmacy*   Lab Work: FASTING lab work to check cholesterol in about 6 months   If you have labs (blood work) drawn today and your tests are completely normal, you will receive your results only by: [place] (if you have MyChart) OR A paper copy in the mail If you have any lab test that is abnormal or we need to change your treatment, we will call you to review the results.   Testing/Procedures: NONE   Follow-Up: At [facility], you and your health needs are our priority.  As part of our continuing mission to provide you with exceptional heart care, we have created designated Provider Care Teams.  These Care Teams include your primary Cardiologist (physician) and Advanced Practice Providers (APPs -  Physician Assistants and Nurse Practitioners) who all work together to provide you with the care you need, when you need it.  We recommend signing up for the patient portal called "MyChart".  Sign up information is provided on this After Visit Summary.  MyChart is used to connect with patients for Virtual Visits (Telemedicine).  Patients are able to view lab/test results, encounter notes, upcoming appointments, etc.  Non-urgent messages can be sent to your provider as well.   To learn more about what you can do with MyChart, go to [email or web address].    Your next appointment:    6 months with Dr. Debara Pickett

## 2022-03-16 ENCOUNTER — Ambulatory Visit (INDEPENDENT_AMBULATORY_CARE_PROVIDER_SITE_OTHER): Payer: No Typology Code available for payment source | Admitting: Physician Assistant

## 2022-03-30 ENCOUNTER — Encounter (INDEPENDENT_AMBULATORY_CARE_PROVIDER_SITE_OTHER): Payer: Self-pay | Admitting: Physician Assistant

## 2022-03-30 ENCOUNTER — Ambulatory Visit (INDEPENDENT_AMBULATORY_CARE_PROVIDER_SITE_OTHER): Payer: No Typology Code available for payment source | Admitting: Physician Assistant

## 2022-03-30 VITALS — BP 101/71 | HR 65 | Temp 98.1°F | Ht 67.0 in | Wt 210.0 lb

## 2022-03-30 DIAGNOSIS — E669 Obesity, unspecified: Secondary | ICD-10-CM | POA: Diagnosis not present

## 2022-03-30 DIAGNOSIS — E559 Vitamin D deficiency, unspecified: Secondary | ICD-10-CM | POA: Diagnosis not present

## 2022-03-30 DIAGNOSIS — Z6832 Body mass index (BMI) 32.0-32.9, adult: Secondary | ICD-10-CM

## 2022-03-30 DIAGNOSIS — E538 Deficiency of other specified B group vitamins: Secondary | ICD-10-CM

## 2022-03-30 MED ORDER — ERGOCALCIFEROL 1.25 MG (50000 UT) PO CAPS: 50000.0000 [IU] | ORAL_CAPSULE | ORAL | 0 refills | Status: DC

## 2022-04-15 NOTE — Progress Notes (Signed)
Chief Complaint:   OBESITY Rael is here to discuss her progress with her obesity treatment plan along with follow-up of her obesity related diagnoses. Chelsy is on the Category 3 Plan and states she is following her eating plan approximately 95% of the time. Jonica states she is piliates/walking 50/30 minutes 2-5 times per week.  Today's visit was #: 26 Starting weight: 224 lbs Starting date: 06/18/2020 Today's weight: 210 lbs Today's date: 03/30/2022 Total lbs lost to date: 14 lbs Total lbs lost since last in-office visit: 0  Interim History: Kaelie traveled to sister's for Thanksgiving and going back at Christmas. Some eating off plan with traveling/holiday and discussed some strategies for travel, meeting protein needs.   Subjective:   1. Vitamin D deficiency Taking ergocalciferol weekly. Denies any side effects. Level 46.1.  2. Vitamin B 12 deficiency Taking over the counter B12 1000 mcg daily and working on eating plan/B12 rich diet.   Assessment/Plan:   1. Vitamin D deficiency We will refill Vit D 50K IU once a week for 1 month with 0 refills.  -Refill ergocalciferol (DRISDOL) 1.25 MG (50000 UT) capsule; Take 1 capsule (50,000 Units total) by mouth once a week.  Dispense: 4 capsule; Refill: 0  2. Vitamin B 12 deficiency Continue healthy eating plan and exercise. Continue B12 daily.  3. Obesity, Current BMI 32.9 Fe is currently in the action stage of change. As such, her goal is to continue with weight loss efforts. She has agreed to the Category 3 Plan.   Exercise goals: As is. Add in peddler or simple leg strengthening exercises.  Behavioral modification strategies: increasing lean protein intake, decreasing simple carbohydrates, travel eating strategies, and holiday eating strategies .  Nidya has agreed to follow-up with our clinic in 4 weeks. She was informed of the importance of frequent follow-up visits to maximize her success with intensive lifestyle  modifications for her multiple health conditions.   Objective:   Blood pressure 101/71, pulse 65, temperature 98.1 F (36.7 C), height '5\' 7"'$  (1.702 m), weight 210 lb (95.3 kg), last menstrual period 11/15/2018, SpO2 98 %. Body mass index is 32.89 kg/m.  General: Cooperative, alert, well developed, in no acute distress. HEENT: Conjunctivae and lids unremarkable. Cardiovascular: Regular rhythm.  Lungs: Normal work of breathing. Neurologic: No focal deficits.   Lab Results  Component Value Date   CREATININE 0.58 10/17/2021   BUN 19 10/17/2021   NA 141 10/17/2021   K 4.9 10/17/2021   CL 104 10/17/2021   CO2 29 10/17/2021   Lab Results  Component Value Date   ALT 17 10/17/2021   AST 14 10/17/2021   ALKPHOS 101 05/20/2021   BILITOT 0.5 10/17/2021   Lab Results  Component Value Date   HGBA1C 5.1 05/20/2021   HGBA1C 5.2 05/28/2020   HGBA1C 5.1 11/28/2019   HGBA1C 5.2 09/20/2014   HGBA1C 5.3 10/07/2010   Lab Results  Component Value Date   INSULIN 9.2 05/20/2021   INSULIN 6.9 06/18/2020   Lab Results  Component Value Date   TSH 1.71 01/27/2022   Lab Results  Component Value Date   CHOL 210 (H) 03/03/2022   HDL 62 03/03/2022   LDLCALC 127 (H) 03/03/2022   TRIG 116 03/03/2022   CHOLHDL 3.4 03/03/2022   Lab Results  Component Value Date   VD25OH 46.1 05/20/2021   VD25OH 40.1 11/06/2020   VD25OH 43 05/28/2020   Lab Results  Component Value Date   WBC 5.8 10/17/2021  HGB 14.0 10/17/2021   HCT 42.3 10/17/2021   MCV 85.5 10/17/2021   PLT 255 10/17/2021   Lab Results  Component Value Date   IRON 47 01/15/2017   TIBC 448 01/15/2017   FERRITIN 122 06/18/2020   Attestation Statements:   Reviewed by clinician on day of visit: allergies, medications, problem list, medical history, surgical history, family history, social history, and previous encounter notes.  I, Brendell Tyus, am acting as transcriptionist for [employer], PA.  I have  reviewed the above documentation for accuracy and completeness, and I agree with the above. -  Johnchristopher Sarvis,PA-C

## 2022-04-24 ENCOUNTER — Telehealth: Payer: Self-pay | Admitting: Internal Medicine

## 2022-04-24 ENCOUNTER — Telehealth (INDEPENDENT_AMBULATORY_CARE_PROVIDER_SITE_OTHER): Payer: No Typology Code available for payment source | Admitting: Internal Medicine

## 2022-04-24 ENCOUNTER — Encounter: Payer: Self-pay | Admitting: Internal Medicine

## 2022-04-24 VITALS — HR 89 | Temp 97.6°F | Wt 210.0 lb

## 2022-04-24 DIAGNOSIS — U071 COVID-19: Secondary | ICD-10-CM | POA: Diagnosis not present

## 2022-04-24 MED ORDER — NIRMATRELVIR/RITONAVIR (PAXLOVID)TABLET: 3.0000 | ORAL_TABLET | Freq: Two times a day (BID) | ORAL | 0 refills | Status: AC

## 2022-04-24 MED ORDER — BENZONATATE 100 MG PO CAPS: 100.0000 mg | ORAL_CAPSULE | Freq: Three times a day (TID) | ORAL | 0 refills | Status: DC | PRN

## 2022-04-24 NOTE — Telephone Encounter (Signed)
Patient called and said sick since 04/11/22 and she thought she was getting better by the first. She started feeling worse 2 days ago and she ended up having a positive test for covid today and she wanted to know what she should be doing.

## 2022-04-24 NOTE — Progress Notes (Signed)
   Subjective:    Patient ID: Tricia Miranda, female    DOB: December 29, 1965, 57 y.o.   MRN: [id number]  HPI 57 year old Female patient in this practice seen today via interactive audio and video telecommunications.  She is at her home and I am at my office.  She is identified using 2 identifiers as Tricia Miranda, a patient in this practice.  She is agreeable to visit in this format today. She has tested positive for COVID-19.  She has nasal and chest congestion.  Has malaise and fatigue.  Patient developed some respiratory infection symptoms on December 23 but only recently tested positive for COVID-19 as of today.  My feeling is that this is a new acute infection and she may have had some other issue prior to this in late December.  She has no nausea or vomiting.  History of COVID-19 in January 2022 which was her initial infection with COVID-19 and  today is her second infection with COVID-19  Patient has history of hypertension, dyslipidemia, hypothyroidism, anxiety and vitamin D deficiency.  History of sleep apnea treated with an oral appliance.  History of vitamin D deficiency.  History of TAH/BSO July 2020.  History of statin intolerance.  She had minor allergies with statin and was referred to Dr. Debara Pickett for evaluation.  Family history of hyperlipidemia.  Right comminuted humerus fracture October 2007.  Left fibula fracture March 2009.  Our records indicate she was last vaccinated for [place] in December 2022.   Review of Systems see above     Objective:   Physical Exam  She is seen virtually and is in no acute distress and does not appear to be tachypneic and not heard to be coughing      Assessment & Plan:   Acute COVID-19 virus infection  Plan: Have prescribed Tessalon Perles 100 mg up to 3 times daily as needed for cough.  Patient advised to monitor herself for shortness of breath.  She is to walk some around her home to prevent atelectasis.  Quarantine at home for 5 days.  Stay  well-hydrated with plenty of fluids.  May want to consider pulse oximetry to monitor oxygen level.  Take Paxlovid regular strength as directed.  Call if not improving in 48 hours or sooner if worse.  Time spent with visit today is 20 minutes.

## 2022-04-24 NOTE — Telephone Encounter (Signed)
scheduled

## 2022-04-24 NOTE — Patient Instructions (Addendum)
Tessalon Perles 100 mg up to 3 times daily as needed for cough.  Watch for shortness of breath.  Walk some to prevent atelectasis in the lungs.  Take Paxlovid regular strength as directed.  Quarantine at home for 5 days.  Drink plenty of fluids and stay well-hydrated.  Call back if you develop extreme shortness of breath or unable to keep down liquids.  May take Tylenol for fever or aching if needed.

## 2022-04-30 ENCOUNTER — Ambulatory Visit (INDEPENDENT_AMBULATORY_CARE_PROVIDER_SITE_OTHER): Payer: No Typology Code available for payment source | Admitting: Adult Health

## 2022-05-07 ENCOUNTER — Encounter (INDEPENDENT_AMBULATORY_CARE_PROVIDER_SITE_OTHER): Payer: Self-pay | Admitting: Adult Health

## 2022-05-07 ENCOUNTER — Ambulatory Visit (INDEPENDENT_AMBULATORY_CARE_PROVIDER_SITE_OTHER): Payer: No Typology Code available for payment source | Admitting: Adult Health

## 2022-05-07 VITALS — BP 113/78 | HR 71 | Temp 98.0°F | Ht 67.0 in | Wt 213.0 lb

## 2022-05-07 DIAGNOSIS — E782 Mixed hyperlipidemia: Secondary | ICD-10-CM | POA: Diagnosis not present

## 2022-05-07 DIAGNOSIS — E88819 Insulin resistance, unspecified: Secondary | ICD-10-CM

## 2022-05-07 DIAGNOSIS — E559 Vitamin D deficiency, unspecified: Secondary | ICD-10-CM | POA: Diagnosis not present

## 2022-05-07 DIAGNOSIS — E538 Deficiency of other specified B group vitamins: Secondary | ICD-10-CM | POA: Diagnosis not present

## 2022-05-07 DIAGNOSIS — E669 Obesity, unspecified: Secondary | ICD-10-CM

## 2022-05-07 DIAGNOSIS — Z6833 Body mass index (BMI) 33.0-33.9, adult: Secondary | ICD-10-CM

## 2022-05-08 LAB — COMPREHENSIVE METABOLIC PANEL
ALT: 24 IU/L (ref 0–32)
AST: 18 IU/L (ref 0–40)
Albumin/Globulin Ratio: 1.8 (ref 1.2–2.2)
Albumin: 4.3 g/dL (ref 3.8–4.9)
Alkaline Phosphatase: 95 IU/L (ref 44–121)
BUN/Creatinine Ratio: 29 — ABNORMAL HIGH (ref 9–23)
BUN: 17 mg/dL (ref 6–24)
Bilirubin Total: 0.3 mg/dL (ref 0.0–1.2)
CO2: 22 mmol/L (ref 20–29)
Calcium: 9.2 mg/dL (ref 8.7–10.2)
Chloride: 101 mmol/L (ref 96–106)
Creatinine, Ser: 0.59 mg/dL (ref 0.57–1.00)
Globulin, Total: 2.4 g/dL (ref 1.5–4.5)
Glucose: 93 mg/dL (ref 70–99)
Potassium: 4.4 mmol/L (ref 3.5–5.2)
Sodium: 137 mmol/L (ref 134–144)
Total Protein: 6.7 g/dL (ref 6.0–8.5)
eGFR: 106 mL/min/{1.73_m2} (ref >59)

## 2022-05-08 LAB — LIPID PANEL
Chol/HDL Ratio: 3.5 ratio (ref 0.0–4.4)
Cholesterol, Total: 208 mg/dL — ABNORMAL HIGH (ref 100–199)
HDL: 59 mg/dL (ref >39)
LDL Chol Calc (NIH): 134 mg/dL — ABNORMAL HIGH (ref 0–99)
Triglycerides: 85 mg/dL (ref 0–149)
VLDL Cholesterol Cal: 15 mg/dL (ref 5–40)

## 2022-05-08 LAB — VITAMIN D 25 HYDROXY (VIT D DEFICIENCY, FRACTURES): Vit D, 25-Hydroxy: 36.6 ng/mL (ref 30.0–100.0)

## 2022-05-08 LAB — HEMOGLOBIN A1C
Est. average glucose Bld gHb Est-mCnc: 105 mg/dL
Hgb A1c MFr Bld: 5.3 % (ref 4.8–5.6)

## 2022-05-08 LAB — VITAMIN B12: Vitamin B-12: 1024 pg/mL (ref 232–1245)

## 2022-05-08 LAB — INSULIN, RANDOM: INSULIN: 13.1 u[IU]/mL (ref 2.6–24.9)

## 2022-05-13 NOTE — Progress Notes (Signed)
Chief Complaint:   OBESITY Tricia Miranda is here to discuss her progress with her obesity treatment plan along with follow-up of her obesity related diagnoses. [name] is on the Category 3 Plan and states she is following her eating plan approximately 95% of the time. [name] states she is walking some, patient had COVID.  Today's visit was #: 70 Starting weight: 65 LBS Starting date: 06/18/2020 Today's weight: 213 LBS Today's date: 05/07/2022 Total lbs lost to date: 11 LBS Total lbs lost since last in-office visit: +3 LBS  Interim History:  [name] was COVID-19 + early Jan 2024 She was treated with 5 day course of Paxlovid therapy. She has slowly been increasing activity since acute viral illness.  2024 Health Goals 1) Increase regular exercise 2) Get into better physical shape 3) Increase meal planning/prepping  Subjective:   1. Vitamin D deficiency Patient is currently on weekly ergocalciferol.   Patient denies nausea, vomiting, muscle weakness.  2. Vitamin B 12 deficiency Patient is currently on daily B12 supplementation.    3. Mixed hyperlipidemia Patient denies tobacco or vaping use.   Patient is not currently on a statin therapy. Lipid Panel     Component Value Date/Time   CHOL 208 (H) 05/07/2022 0937   TRIG 85 05/07/2022 0937   HDL 59 05/07/2022 0937   CHOLHDL 3.5 05/07/2022 0937   CHOLHDL 3.6 01/27/2022 1213   VLDL 26 11/08/2015 1026   LDLCALC 134 (H) 05/07/2022 0937   LDLCALC 128 (H) 01/27/2022 1213   LABVLDL 15 05/07/2022 0937     4. Insulin resistance Lab Results  Component Value Date   HGBA1C 5.3 05/07/2022   HGBA1C 5.1 05/20/2021   HGBA1C 5.2 05/28/2020    Insulin level fluctuates between 13.1-9.2 Patient is not currently on any blood glucose lowering medication.  Assessment/Plan:   1. Vitamin D deficiency Check labs today and we will refill ergocalciferol as appropriate.  - VITAMIN D 25 Hydroxy (Vit-D Deficiency, Fractures)  2. Vitamin B  12 deficiency Check labs today.  - Vitamin B12  3. Mixed hyperlipidemia Check labs today.  - Comprehensive metabolic panel - Lipid panel  4. Insulin resistance Check labs today.  - Hemoglobin A1c - Insulin, random  5. Obesity, Current BMI 33.4 Tricia Miranda is currently in the action stage of change. As such, her goal is to continue with weight loss efforts. She has agreed to the Category 3 Plan.   Exercise goals:  Increase activity as tolerated.  Behavioral modification strategies: increasing lean protein intake, decreasing simple carbohydrates, meal planning and cooking strategies, keeping healthy foods in the home, and planning for success.  [name] has agreed to follow-up with our clinic in 4 weeks. She was informed of the importance of frequent follow-up visits to maximize her success with intensive lifestyle modifications for her multiple health conditions.   Objective:   Blood pressure 113/78, pulse 71, temperature 98 F (36.7 C), height '5\' 7"'$  (1.702 m), weight 213 lb (96.6 kg), last menstrual period 11/15/2018, SpO2 96 %. Body mass index is 33.36 kg/m.  General: Cooperative, alert, well developed, in no acute distress. HEENT: Conjunctivae and lids unremarkable. Cardiovascular: Regular rhythm.  Lungs: Normal work of breathing. Neurologic: No focal deficits.   Lab Results  Component Value Date   CREATININE 0.59 05/07/2022   BUN 17 05/07/2022   NA 137 05/07/2022   K 4.4 05/07/2022   CL 101 05/07/2022   CO2 22 05/07/2022   Lab Results  Component Value Date  ALT 24 05/07/2022   AST 18 05/07/2022   ALKPHOS 95 05/07/2022   BILITOT 0.3 05/07/2022   Lab Results  Component Value Date   HGBA1C 5.3 05/07/2022   HGBA1C 5.1 05/20/2021   HGBA1C 5.2 05/28/2020   HGBA1C 5.1 11/28/2019   HGBA1C 5.2 09/20/2014   Lab Results  Component Value Date   INSULIN 13.1 05/07/2022   INSULIN 9.2 05/20/2021   INSULIN 6.9 06/18/2020   Lab Results  Component Value Date   TSH 1.71  01/27/2022   Lab Results  Component Value Date   CHOL 208 (H) 05/07/2022   HDL 59 05/07/2022   LDLCALC 134 (H) 05/07/2022   TRIG 85 05/07/2022   CHOLHDL 3.5 05/07/2022   Lab Results  Component Value Date   VD25OH 36.6 05/07/2022   VD25OH 46.1 05/20/2021   VD25OH 40.1 11/06/2020   Lab Results  Component Value Date   WBC 5.8 10/17/2021   HGB 14.0 10/17/2021   HCT 42.3 10/17/2021   MCV 85.5 10/17/2021   PLT 255 10/17/2021   Lab Results  Component Value Date   IRON 47 01/15/2017   TIBC 448 01/15/2017   FERRITIN 122 06/18/2020   Attestation Statements:   Reviewed by clinician on day of visit: allergies, medications, problem list, medical history, surgical history, family history, social history, and previous encounter notes.  I, Davy Pique, RMA, am acting as Location manager for Mina Marble, NP.  I have reviewed the above documentation for accuracy and completeness, and I agree with the above. -  Gray Maugeri d. Krishawn Vanderweele, NP-C

## 2022-05-20 ENCOUNTER — Ambulatory Visit (INDEPENDENT_AMBULATORY_CARE_PROVIDER_SITE_OTHER): Payer: No Typology Code available for payment source | Admitting: Gastroenterology

## 2022-05-20 ENCOUNTER — Encounter: Payer: Self-pay | Admitting: Gastroenterology

## 2022-05-20 VITALS — BP 126/68 | HR 64 | Ht 67.0 in | Wt 218.0 lb

## 2022-05-20 DIAGNOSIS — R933 Abnormal findings on diagnostic imaging of other parts of digestive tract: Secondary | ICD-10-CM | POA: Diagnosis not present

## 2022-05-20 DIAGNOSIS — K5900 Constipation, unspecified: Secondary | ICD-10-CM | POA: Diagnosis not present

## 2022-05-20 DIAGNOSIS — R1012 Left upper quadrant pain: Secondary | ICD-10-CM | POA: Diagnosis not present

## 2022-05-20 MED ORDER — CITRUCEL PO POWD: 1.0000 | Freq: Every day | ORAL | Status: AC

## 2022-05-20 NOTE — Progress Notes (Signed)
HPI :  58 year old female with a history of OSA, hyperlipidemia, hypothyroidism, constipation, here to reestablish care for her bowel habits and abnormal x-ray, abdominal pain.  I have not seen her since December 2018 at time of her screening colonoscopy.  She states this past May/June 2023 timeframe she developed some mild constipation with bloating in her abdomen, belching, mild nausea.  She has been followed by weight management and working on weight loss.  She had an x-ray done which showed some prominent loops of small bowel.  Given some MiraLAX for constipation which she stated she took for a few weeks and it helped her to feel better.  She states she was told to follow-up with me for her x-ray abnormality.  She states she thinks she has had mild IBS for her entire life.  Has some constipation at times.  More so has had a sense of incomplete emptying of her bowel when she does go.  She will have anywhere from 1 bowel movement to 3 bowel movements per day.  She denies any straining.  She denies any new medications around the time that she noticed some of the symptoms.  She is not taking anything currently for her bowels.  No blood in her stools.  She does endorse some occasional pain she has been having in her left upper quadrant.  States has been going on for less than a year.  Has pain that can actually wake her at night, feels a sharp pain in her left upper quadrant.  It is not related at all to eating, is not related at all to moving her bowels.  She denies any clear triggers to this pain.  She will feel this perhaps once per month or more and it will last roughly a day at a time when she has it.  She denies any nausea or vomiting with this.  Otherwise eating well.  She does have a sense of feeling bloated and gassy at times.  Endoscopic history: Colonoscopy 03/22/17: - The examined portion of the ileum was normal. - One 4 mm polyp in the ascending colon, removed with a cold snare. Resected  and retrieved. - The examination was otherwise normal on direct and retroflexion views.  Surgical [P], ascending, polyp - BENIGN POLYPOID COLORECTAL MUCOSA WITH A LYMPHOID AGGREGATE. - THERE IS NO EVIDENCE OF MALIGNANCY.  Repeat in 10 years   Xray abdomen 10/17/21: FINDINGS: Several loops of prominent small bowel within the left mid abdomen. Large bowel normal in caliber. No radio-opaque calculi or other significant radiographic abnormality are seen.   IMPRESSION: Several loops of prominent small bowel within the left mid abdomen.  Past Medical History:  Diagnosis Date   Anxiety    Anxiety    Bronchitis    Cancer (HCC)    basal cell- tiny   Cancer ([place])    Fibroids    uterine    High cholesterol    Hypothyroidism    Joint pain    Low iron    Low vitamin D level    Osteoarthritis    Sleep apnea    uses oral device for mild sleep apnea    Thyroid disease    hypothyroidism   Vitamin B deficiency      Past Surgical History:  Procedure Laterality Date   ABDOMINAL HYSTERECTOMY     COLONOSCOPY     with benign  polyp removal    DILITATION & CURRETTAGE/HYSTROSCOPY WITH VERSAPOINT RESECTION N/A 03/16/2014   Procedure: DILATATION &  CURETTAGE/HYSTEROSCOPY WITH VERSAPOINT RESECTION;  Surgeon: [name], MD;  Location: [facility];  Service: Gynecology;  Laterality: N/A;  1 hr.   HYSTERECTOMY ABDOMINAL WITH SALPINGECTOMY Bilateral 11/15/2018   Procedure: HYSTERECTOMY ABDOMINAL WITH  BILATERAL SALPINGECTOMY;  Surgeon: [name], MD;  Location: [facility];  Service: Gynecology;  Laterality: Bilateral;   MOHS SURGERY Left a while- unknown   under left eyelid   WISDOM TOOTH EXTRACTION     Family History  Problem Relation Age of Onset   Hypertension Mother    High Cholesterol Mother    Sleep apnea Mother    Heart disease Father    Alcohol abuse Father    Anxiety disorder Father    Stomach cancer Other    Colon cancer Neg Hx    Esophageal cancer  Neg Hx    Pancreatic cancer Neg Hx    Prostate cancer Neg Hx    Rectal cancer Neg Hx    Social History   Tobacco Use   Smoking status: Never   Smokeless tobacco: Never  Vaping Use   Vaping Use: Never used  Substance Use Topics   Alcohol use: Yes    Comment: socially   Drug use: No   Current Outpatient Medications  Medication Sig Dispense Refill   ALPRAZolam (XANAX) 0.25 MG tablet TAKE ONE TABLET BY MOUTH TWICE A DAY AS NEEDED 60 tablet 3   cyanocobalamin 1000 MCG tablet Take 1,000 mcg by mouth daily.     ergocalciferol (DRISDOL) 1.25 MG (50000 UT) capsule Take 1 capsule (50,000 Units total) by mouth once a week. 4 capsule 0   FLUoxetine (PROZAC) 20 MG capsule Take 20 mg by mouth daily.     meloxicam (MOBIC) 15 MG tablet TAKE ONE TABLET BY MOUTH DAILY (Patient taking differently: Take 15 mg by mouth as needed.) 30 tablet 3   propranolol (INDERAL) 10 MG tablet Take 10 mg by mouth as needed.     SYNTHROID 100 MCG tablet TAKE ONE TABLET BY MOUTH DAILY BEFORE A MEAL 90 tablet 1   ezetimibe (ZETIA) 10 MG tablet Take 1 tablet (10 mg total) by mouth daily. 90 tablet 3   No current facility-administered medications for this visit.   Allergies  Allergen Reactions   Cat Hair Extract Other (See Comments)     Review of Systems: All systems reviewed and negative except where noted in HPI.   Lab Results  Component Value Date   WBC 5.8 10/17/2021   HGB 14.0 10/17/2021   HCT 42.3 10/17/2021   MCV 85.5 10/17/2021   PLT 255 10/17/2021    Lab Results  Component Value Date   CREATININE 0.59 05/07/2022   BUN 17 05/07/2022   NA 137 05/07/2022   K 4.4 05/07/2022   CL 101 05/07/2022   CO2 22 05/07/2022    Lab Results  Component Value Date   ALT 24 05/07/2022   AST 18 05/07/2022   ALKPHOS 95 05/07/2022   BILITOT 0.3 05/07/2022    Lab Results  Component Value Date   TSH 1.71 01/27/2022     Physical Exam: BP 126/68   Pulse 64   Ht '5\' 7"'$  (1.702 m)   Wt 218 lb (98.9 kg)    LMP 11/15/2018   BMI 34.14 kg/m  Constitutional: Pleasant,well-developed, female in no acute distress. HEENT: Normocephalic and atraumatic. Conjunctivae are normal. No scleral icterus. Neck supple.  Cardiovascular: Normal rate, regular rhythm.  Pulmonary/chest: Effort normal and breath sounds normal.  Abdominal: Soft, nondistended, nontender.  There are no masses palpable. No hepatomegaly. Extremities: no edema Lymphadenopathy: No cervical adenopathy noted. Neurological: Alert and oriented to person place and time. Skin: Skin is warm and dry. No rashes noted. Psychiatric: Normal mood and affect. Behavior is normal.   ASSESSMENT: 57 y.o. female here for assessment of the following  1. Constipation, unspecified constipation type   2. LUQ pain   3. Abnormal finding on GI tract imaging    Some ongoing mild constipation/sense of incomplete emptying.  Discussed some options to treat this.  Her last colonoscopy is normal and reassuring.  Recommend she try either Citrucel or Benefiber once daily to keep stools soft and help with emptying.  If this does not help she can try MiraLAX again and titrate up as needed.  She has no alarm symptoms, recent labs reassuring.  She will contact me if this does not provide any benefit.  Otherwise, having some periodic left upper quadrant pain as described.  No real triggers to this but does bother her quite a bit when she has it, occurs infrequently but can last for upwards of a day at a time.  Hard to really say what this represents based on her history, could be musculoskeletal although I cannot elicit it on exam today.  I offered her CT scan to further evaluate this pain given it has been persisting over time and continues to bother her.  We discussed this for a bit and she does want to proceed with that for peace of mind.  I reassured her that her x-ray finding was not too concerning to me, she probably had some mild constipation at the time, however CT scan  will reevaluate that but main reason is to evaluate her pain.  She agrees, further recommendations pending result.   PLAN: - recommend Citrucel or Benefiber daily, or Miralax - CT abdomen / pelvis with IV contrast to further evaluate LUQ pain - could be musculoskeletal if negative. Reassured her otherwise about x ray findings.  Jolly Mango, MD [facility]  CC: Elby Showers, MD

## 2022-05-20 NOTE — Patient Instructions (Signed)
If you are age 57 or older, your body mass index should be between 23-30. Your Body mass index is 34.14 kg/m. If this is out of the aforementioned range listed, please consider follow up with your Primary Care Provider.  If you are age 62 or younger, your body mass index should be between 19-25. Your Body mass index is 34.14 kg/m. If this is out of the aformentioned range listed, please consider follow up with your Primary Care Provider.   ________________________________________________________   Dennis Bast have been scheduled for a CT scan of the abdomen and pelvis at [facility], 1st floor Radiology. You are scheduled on Wed, 05-27-22 at 3:30pm. You should arrive 15 minutes prior to your appointment time for registration.   Please follow the written instructions below on the day of your exam:   1) Do not eat anything after 11:30am (4 hours prior to your test)   You may take any medications as prescribed with a small amount of water, if necessary. If you take any of the following medications: METFORMIN, GLUCOPHAGE, [place], AVANDAMET, RIOMET, FORTAMET, [place] MET, JANUMET, GLUMETZA or METAGLIP, you MAY be asked to HOLD this medication 48 hours AFTER the exam.   If you have any questions regarding your exam or if you need to reschedule, you may call [facility] at [phone] between the hours of 8:00 am and 5:00 pm, Monday-Friday.  _____________________________________________________  Take Citrucel or Benefiber daily.  Thank you for entrusting me with your care and for choosing [facility], Dr. Piedra Cellar

## 2022-05-27 ENCOUNTER — Ambulatory Visit (HOSPITAL_COMMUNITY): Payer: No Typology Code available for payment source

## 2022-05-30 ENCOUNTER — Other Ambulatory Visit: Payer: Self-pay | Admitting: Internal Medicine

## 2022-06-02 ENCOUNTER — Ambulatory Visit
Admission: RE | Admit: 2022-06-02 | Discharge: 2022-06-02 | Disposition: A | Discharge status: Home / Self Care | Payer: No Typology Code available for payment source | Source: Ambulatory Visit | Attending: Gastroenterology | Admitting: Gastroenterology

## 2022-06-02 DIAGNOSIS — R1012 Left upper quadrant pain: Secondary | ICD-10-CM

## 2022-06-02 DIAGNOSIS — R933 Abnormal findings on diagnostic imaging of other parts of digestive tract: Secondary | ICD-10-CM

## 2022-06-02 MED ORDER — IOPAMIDOL (ISOVUE-370) INJECTION 76%
75.0000 mL | Freq: Once | INTRAVENOUS | Status: AC | PRN
  Administered 2022-06-02: 75 mL via INTRAVENOUS

## 2022-06-09 ENCOUNTER — Ambulatory Visit (INDEPENDENT_AMBULATORY_CARE_PROVIDER_SITE_OTHER): Payer: No Typology Code available for payment source | Admitting: Adult Health

## 2022-06-11 ENCOUNTER — Encounter (INDEPENDENT_AMBULATORY_CARE_PROVIDER_SITE_OTHER): Payer: Self-pay | Admitting: Adult Health

## 2022-06-11 ENCOUNTER — Other Ambulatory Visit: Payer: Self-pay | Admitting: Internal Medicine

## 2022-06-11 ENCOUNTER — Ambulatory Visit (INDEPENDENT_AMBULATORY_CARE_PROVIDER_SITE_OTHER): Payer: No Typology Code available for payment source | Admitting: Adult Health

## 2022-06-11 VITALS — BP 121/83 | HR 62 | Temp 98.3°F | Ht 67.0 in | Wt 214.0 lb

## 2022-06-11 DIAGNOSIS — E559 Vitamin D deficiency, unspecified: Secondary | ICD-10-CM | POA: Diagnosis not present

## 2022-06-11 DIAGNOSIS — E669 Obesity, unspecified: Secondary | ICD-10-CM

## 2022-06-11 DIAGNOSIS — E785 Hyperlipidemia, unspecified: Secondary | ICD-10-CM

## 2022-06-11 DIAGNOSIS — E88819 Insulin resistance, unspecified: Secondary | ICD-10-CM | POA: Diagnosis not present

## 2022-06-11 DIAGNOSIS — E538 Deficiency of other specified B group vitamins: Secondary | ICD-10-CM

## 2022-06-11 DIAGNOSIS — E782 Mixed hyperlipidemia: Secondary | ICD-10-CM

## 2022-06-11 DIAGNOSIS — Z6833 Body mass index (BMI) 33.0-33.9, adult: Secondary | ICD-10-CM

## 2022-06-11 MED ORDER — ERGOCALCIFEROL 1.25 MG (50000 UT) PO CAPS: 50000.0000 [IU] | ORAL_CAPSULE | ORAL | 0 refills | Status: DC

## 2022-06-11 NOTE — Progress Notes (Signed)
Chief Complaint:   OBESITY Tricia Miranda is here to discuss her progress with her obesity treatment plan along with follow-up of her obesity related diagnoses. Tricia Miranda is on the Category 3 Plan and states she is following her eating plan approximately 70% of the time.  Tricia Miranda states she is not currently exercising.  Today's visit was #: 28 Starting weight: 224 lbs Starting date: 06/18/20 Today's weight: 214 lbs Today's date: 06/11/2022 Total lbs lost to date: 10 Total lbs lost since last in-office visit: + 1 lbs  Interim History:  She has membership with [employer]- 4 class pack per week- $90/moth Also has membership at [employer]  Subjective:   1. Vitamin D deficiency Discussed Labs  Latest Reference Range & Units 05/07/22 09:37  Vitamin D, 25-Hydroxy 30.0 - 100.0 ng/mL 36.6    2. Vitamin B 12 deficiency Discussed Labs  Latest Reference Range & Units 05/07/22 09:37  Vitamin B12 232 - 1,245 pg/mL 1,024  She is on oral B12 1039mg QD  3. Mixed hyperlipidemia Discussed Labs Lipid Panel     Component Value Date/Time   CHOL 208 (H) 05/07/2022 0937   TRIG 85 05/07/2022 0937   HDL 59 05/07/2022 0937   CHOLHDL 3.5 05/07/2022 0937   CHOLHDL 3.6 01/27/2022 1213   VLDL 26 11/08/2015 1026   LDLCALC 134 (H) 05/07/2022 0937   LDLCALC 128 (H) 01/27/2022 1213   LABVLDL 15 05/07/2022 0937   The 10-year ASCVD risk score (Arnett DK, et al., 2019) is: 2.6%   Values used to calculate the score:     Age: [age]years     Sex: Female     Is Non-Hispanic African American: No     Diabetic: No     Tobacco smoker: No     Systolic Blood Pressure: 1123XX123mmHg     Is BP treated: Yes     HDL Cholesterol: 59 mg/dL     Total Cholesterol: 208 mg/dL   02/19/2020  ADDENDUM REPORT: 03/06/2020 20:20   CLINICAL DATA:  Risk stratification   EXAM: Coronary Calcium Score   TECHNIQUE: The patient was scanned on a [employer]. Axial non-contrast 3 mm slices were carried out through the heart.  The data set was analyzed on a dedicated work station and scored using the [place]   FINDINGS: [facility]-cardiac: [facility]   Ascending Aorta: Normal caliber   Pericardium: Normal   Coronary arteries: Normal origins.   IMPRESSION: Coronary calcium score of 0. This is a low risk study.  CT Abd Pelvis  MPRESSION: 1.  No acute process or explanation for left-sided pain. 2.  Aortic Atherosclerosis (ICD10-I70.0).   Her mother has significant HLD Her father had CABG x 2 (likely tobacco use induced, per pt?)  4. Insulin resistance Discussed Labs Lab Results  Component Value Date   HGBA1C 5.3 05/07/2022   HGBA1C 5.1 05/20/2021   HGBA1C 5.2 05/28/2020   05/07/2022- 13.1 Insulin level   Assessment/Plan:   1. Vitamin D deficiency REfill - ergocalciferol (DRISDOL) 1.25 MG (50000 UT) capsule; Take 1 capsule (50,000 Units total) by mouth once a week.  Dispense: 4 capsule; Refill: 0  2. Vitamin B 12 deficiency Continue daily supplementation  3. Mixed hyperlipidemia F/u with Dr. Hilty/Cards  4. Insulin resistance Decrease sugar/CHO Increase protein intake and regular exercise.  5. Obesity, Current BMI 33.6  Tricia Miranda currently in the action stage of change. As such, her goal is to continue with weight  loss efforts. She has agreed to the Category 3 Plan.   Exercise goals: For substantial health benefits, adults should do at least 150 minutes (2 hours and 30 minutes) a week of moderate-intensity, or 75 minutes (1 hour and 15 minutes) a week of vigorous-intensity aerobic physical activity, or an equivalent combination of moderate- and vigorous-intensity aerobic activity. Aerobic activity should be performed in episodes of at least 10 minutes, and preferably, it should be spread throughout the week.  Behavioral modification strategies: increasing lean protein intake, decreasing simple carbohydrates, increasing vegetables, increasing water  intake, no skipping meals, better snacking choices, and planning for success.  Tricia Miranda has agreed to follow-up with our clinic in 4 weeks. She was informed of the importance of frequent follow-up visits to maximize her success with intensive lifestyle modifications for her multiple health conditions.   Objective:   Blood pressure 121/83, pulse 62, temperature 98.3 F (36.8 C), height '5\' 7"'$  (1.702 m), weight 214 lb (97.1 kg), last menstrual period 11/15/2018, SpO2 98 %. Body mass index is 33.52 kg/m.  General: Cooperative, alert, well developed, in no acute distress. HEENT: Conjunctivae and lids unremarkable. Cardiovascular: Regular rhythm.  Lungs: Normal work of breathing. Neurologic: No focal deficits.   Lab Results  Component Value Date   CREATININE 0.59 05/07/2022   BUN 17 05/07/2022   NA 137 05/07/2022   K 4.4 05/07/2022   CL 101 05/07/2022   CO2 22 05/07/2022   Lab Results  Component Value Date   ALT 24 05/07/2022   AST 18 05/07/2022   ALKPHOS 95 05/07/2022   BILITOT 0.3 05/07/2022   Lab Results  Component Value Date   HGBA1C 5.3 05/07/2022   HGBA1C 5.1 05/20/2021   HGBA1C 5.2 05/28/2020   HGBA1C 5.1 11/28/2019   HGBA1C 5.2 09/20/2014   Lab Results  Component Value Date   INSULIN 13.1 05/07/2022   INSULIN 9.2 05/20/2021   INSULIN 6.9 06/18/2020   Lab Results  Component Value Date   TSH 1.71 01/27/2022   Lab Results  Component Value Date   CHOL 208 (H) 05/07/2022   HDL 59 05/07/2022   LDLCALC 134 (H) 05/07/2022   TRIG 85 05/07/2022   CHOLHDL 3.5 05/07/2022   Lab Results  Component Value Date   VD25OH 36.6 05/07/2022   VD25OH 46.1 05/20/2021   VD25OH 40.1 11/06/2020   Lab Results  Component Value Date   WBC 5.8 10/17/2021   HGB 14.0 10/17/2021   HCT 42.3 10/17/2021   MCV 85.5 10/17/2021   PLT 255 10/17/2021   Lab Results  Component Value Date   IRON 47 01/15/2017   TIBC 448 01/15/2017   FERRITIN 122 06/18/2020    Attestation Statements:    Reviewed by clinician on day of visit: allergies, medications, problem list, medical history, surgical history, family history, social history, and previous encounter notes.  I have reviewed the above documentation for accuracy and completeness, and I agree with the above. -  Majorie Santee d. Oleta Gunnoe, NP-C

## 2022-06-19 HISTORY — PX: APPENDECTOMY: SHX54

## 2022-07-08 ENCOUNTER — Encounter (HOSPITAL_COMMUNITY): Payer: Self-pay

## 2022-07-08 ENCOUNTER — Other Ambulatory Visit: Payer: Self-pay

## 2022-07-08 ENCOUNTER — Observation Stay (HOSPITAL_COMMUNITY)
Admission: EM | Admit: 2022-07-08 | Discharge: 2022-07-09 | Disposition: A | Discharge status: Home / Self Care | Payer: No Typology Code available for payment source | Attending: Emergency Medicine | Admitting: Emergency Medicine

## 2022-07-08 ENCOUNTER — Emergency Department (HOSPITAL_COMMUNITY): Payer: No Typology Code available for payment source

## 2022-07-08 ENCOUNTER — Encounter (HOSPITAL_COMMUNITY)
Admission: EM | Disposition: A | Discharge status: Home / Self Care | Payer: Self-pay | Source: Home / Self Care | Attending: Emergency Medicine

## 2022-07-08 ENCOUNTER — Observation Stay (HOSPITAL_COMMUNITY): Payer: No Typology Code available for payment source | Admitting: Anesthesiology

## 2022-07-08 ENCOUNTER — Ambulatory Visit: Payer: No Typology Code available for payment source | Admitting: Surgical

## 2022-07-08 DIAGNOSIS — J3081 Allergic rhinitis due to animal (cat) (dog) hair and dander: Secondary | ICD-10-CM | POA: Diagnosis present

## 2022-07-08 DIAGNOSIS — E7800 Pure hypercholesterolemia, unspecified: Secondary | ICD-10-CM | POA: Diagnosis present

## 2022-07-08 DIAGNOSIS — Z85828 Personal history of other malignant neoplasm of skin: Secondary | ICD-10-CM | POA: Insufficient documentation

## 2022-07-08 DIAGNOSIS — K35209 Acute appendicitis with generalized peritonitis, without abscess, unspecified as to perforation: Secondary | ICD-10-CM | POA: Diagnosis present

## 2022-07-08 DIAGNOSIS — Z90710 Acquired absence of both cervix and uterus: Secondary | ICD-10-CM

## 2022-07-08 DIAGNOSIS — Z79890 Hormone replacement therapy (postmenopausal): Secondary | ICD-10-CM | POA: Diagnosis not present

## 2022-07-08 DIAGNOSIS — E039 Hypothyroidism, unspecified: Secondary | ICD-10-CM | POA: Insufficient documentation

## 2022-07-08 DIAGNOSIS — Z8249 Family history of ischemic heart disease and other diseases of the circulatory system: Secondary | ICD-10-CM

## 2022-07-08 DIAGNOSIS — K3521 Acute appendicitis with generalized peritonitis, without perforation, with abscess: Secondary | ICD-10-CM | POA: Diagnosis not present

## 2022-07-08 DIAGNOSIS — G4730 Sleep apnea, unspecified: Secondary | ICD-10-CM | POA: Diagnosis present

## 2022-07-08 DIAGNOSIS — R109 Unspecified abdominal pain: Secondary | ICD-10-CM | POA: Diagnosis present

## 2022-07-08 DIAGNOSIS — F419 Anxiety disorder, unspecified: Secondary | ICD-10-CM | POA: Diagnosis present

## 2022-07-08 DIAGNOSIS — K3580 Unspecified acute appendicitis: Secondary | ICD-10-CM | POA: Diagnosis present

## 2022-07-08 DIAGNOSIS — Z79899 Other long term (current) drug therapy: Secondary | ICD-10-CM

## 2022-07-08 DIAGNOSIS — K3533 Acute appendicitis with perforation and localized peritonitis, with abscess: Secondary | ICD-10-CM | POA: Diagnosis not present

## 2022-07-08 DIAGNOSIS — Z9049 Acquired absence of other specified parts of digestive tract: Secondary | ICD-10-CM

## 2022-07-08 HISTORY — DX: Other specified postprocedural states: R11.2

## 2022-07-08 HISTORY — DX: Other specified postprocedural states: Z98.890

## 2022-07-08 HISTORY — PX: LAPAROSCOPIC APPENDECTOMY: SHX408

## 2022-07-08 LAB — I-STAT BETA HCG BLOOD, ED (MC, WL, AP ONLY): I-stat hCG, quantitative: <5 m[IU]/mL (ref <5)

## 2022-07-08 LAB — URINALYSIS, ROUTINE W REFLEX MICROSCOPIC
Bacteria, UA: NONE SEEN
Bilirubin Urine: NEGATIVE
Glucose, UA: NEGATIVE mg/dL
Ketones, ur: NEGATIVE mg/dL
Leukocytes,Ua: NEGATIVE
Nitrite: NEGATIVE
Protein, ur: NEGATIVE mg/dL
Specific Gravity, Urine: 1.008 (ref 1.005–1.030)
pH: 6 (ref 5.0–8.0)

## 2022-07-08 LAB — COMPREHENSIVE METABOLIC PANEL
ALT: 32 U/L (ref 0–44)
AST: 25 U/L (ref 15–41)
Albumin: 3.9 g/dL (ref 3.5–5.0)
Alkaline Phosphatase: 85 U/L (ref 38–126)
Anion gap: 11 (ref 5–15)
BUN: 11 mg/dL (ref 6–20)
CO2: 22 mmol/L (ref 22–32)
Calcium: 8.8 mg/dL — ABNORMAL LOW (ref 8.9–10.3)
Chloride: 101 mmol/L (ref 98–111)
Creatinine, Ser: 0.67 mg/dL (ref 0.44–1.00)
GFR, Estimated: >60 mL/min (ref >60)
Glucose, Bld: 102 mg/dL — ABNORMAL HIGH (ref 70–99)
Potassium: 3.9 mmol/L (ref 3.5–5.1)
Sodium: 134 mmol/L — ABNORMAL LOW (ref 135–145)
Total Bilirubin: 0.9 mg/dL (ref 0.3–1.2)
Total Protein: 7.1 g/dL (ref 6.5–8.1)

## 2022-07-08 LAB — CBC
HCT: 41.4 % (ref 36.0–46.0)
Hemoglobin: 13.5 g/dL (ref 12.0–15.0)
MCH: 28.4 pg (ref 26.0–34.0)
MCHC: 32.6 g/dL (ref 30.0–36.0)
MCV: 87 fL (ref 80.0–100.0)
Platelets: 261 10*3/uL (ref 150–400)
RBC: 4.76 MIL/uL (ref 3.87–5.11)
RDW: 13.3 % (ref 11.5–15.5)
WBC: 11.7 10*3/uL — ABNORMAL HIGH (ref 4.0–10.5)
nRBC: 0 % (ref 0.0–0.2)

## 2022-07-08 LAB — PROTIME-INR
INR: 1 (ref 0.8–1.2)
Prothrombin Time: 12.8 seconds (ref 11.4–15.2)

## 2022-07-08 SURGERY — APPENDECTOMY, LAPAROSCOPIC
Anesthesia: General | Site: Abdomen

## 2022-07-08 MED ORDER — METRONIDAZOLE 500 MG/100ML IV SOLN
500.0000 mg | Freq: Once | INTRAVENOUS | Status: AC
  Administered 2022-07-08: 500 mg via INTRAVENOUS
  Filled 2022-07-08: qty 100

## 2022-07-08 MED ORDER — IOHEXOL 350 MG/ML SOLN
75.0000 mL | Freq: Once | INTRAVENOUS | Status: AC | PRN
  Administered 2022-07-08: 75 mL via INTRAVENOUS

## 2022-07-08 MED ORDER — METRONIDAZOLE 500 MG/100ML IV SOLN
500.0000 mg | Freq: Three times a day (TID) | INTRAVENOUS | Status: DC
  Administered 2022-07-08 – 2022-07-09 (×2): 500 mg via INTRAVENOUS
  Filled 2022-07-08 (×2): qty 100

## 2022-07-08 MED ORDER — KETOROLAC TROMETHAMINE 30 MG/ML IJ SOLN
INTRAMUSCULAR | Status: DC | PRN
  Administered 2022-07-08: 30 mg via INTRAVENOUS

## 2022-07-08 MED ORDER — PROPOFOL 10 MG/ML IV BOLUS
INTRAVENOUS | Status: AC
  Filled 2022-07-08: qty 20

## 2022-07-08 MED ORDER — PHENYLEPHRINE 80 MCG/ML (10ML) SYRINGE FOR IV PUSH (FOR BLOOD PRESSURE SUPPORT)
PREFILLED_SYRINGE | INTRAVENOUS | Status: AC
  Filled 2022-07-08: qty 10

## 2022-07-08 MED ORDER — ROCURONIUM BROMIDE 10 MG/ML (PF) SYRINGE
PREFILLED_SYRINGE | INTRAVENOUS | Status: DC | PRN
  Administered 2022-07-08: 50 mg via INTRAVENOUS

## 2022-07-08 MED ORDER — METHOCARBAMOL 500 MG PO TABS
500.0000 mg | ORAL_TABLET | Freq: Three times a day (TID) | ORAL | Status: DC | PRN
  Administered 2022-07-09 (×2): 500 mg via ORAL
  Filled 2022-07-08 (×2): qty 1

## 2022-07-08 MED ORDER — 0.9 % SODIUM CHLORIDE (POUR BTL) OPTIME
TOPICAL | Status: DC | PRN
  Administered 2022-07-08: 1000 mL

## 2022-07-08 MED ORDER — KETOROLAC TROMETHAMINE 30 MG/ML IJ SOLN
INTRAMUSCULAR | Status: AC
  Filled 2022-07-08: qty 1

## 2022-07-08 MED ORDER — SODIUM CHLORIDE 0.9 % IV SOLN: INTRAVENOUS | Status: DC

## 2022-07-08 MED ORDER — PROCHLORPERAZINE EDISYLATE 10 MG/2ML IJ SOLN: 5.0000 mg | Freq: Four times a day (QID) | INTRAMUSCULAR | Status: DC | PRN

## 2022-07-08 MED ORDER — DIPHENHYDRAMINE HCL 50 MG/ML IJ SOLN: 25.0000 mg | Freq: Four times a day (QID) | INTRAMUSCULAR | Status: DC | PRN

## 2022-07-08 MED ORDER — ROCURONIUM BROMIDE 10 MG/ML (PF) SYRINGE
PREFILLED_SYRINGE | INTRAVENOUS | Status: AC
  Filled 2022-07-08: qty 10

## 2022-07-08 MED ORDER — DOCUSATE SODIUM 100 MG PO CAPS
100.0000 mg | ORAL_CAPSULE | Freq: Two times a day (BID) | ORAL | Status: DC
  Administered 2022-07-08 – 2022-07-09 (×2): 100 mg via ORAL
  Filled 2022-07-08 (×2): qty 1

## 2022-07-08 MED ORDER — MIDAZOLAM HCL 2 MG/2ML IJ SOLN
INTRAMUSCULAR | Status: AC
  Filled 2022-07-08: qty 2

## 2022-07-08 MED ORDER — DIPHENHYDRAMINE HCL 25 MG PO CAPS: 25.0000 mg | ORAL_CAPSULE | Freq: Four times a day (QID) | ORAL | Status: DC | PRN

## 2022-07-08 MED ORDER — ALPRAZOLAM 0.5 MG PO TABS
0.2500 mg | ORAL_TABLET | Freq: Two times a day (BID) | ORAL | Status: DC | PRN
  Administered 2022-07-09: 0.25 mg via ORAL
  Filled 2022-07-08: qty 1

## 2022-07-08 MED ORDER — DEXAMETHASONE SODIUM PHOSPHATE 10 MG/ML IJ SOLN
INTRAMUSCULAR | Status: DC | PRN
  Administered 2022-07-08: 5 mg via INTRAVENOUS

## 2022-07-08 MED ORDER — BUPIVACAINE HCL (PF) 0.25 % IJ SOLN
INTRAMUSCULAR | Status: AC
  Filled 2022-07-08: qty 30

## 2022-07-08 MED ORDER — ONDANSETRON HCL 4 MG/2ML IJ SOLN
4.0000 mg | Freq: Four times a day (QID) | INTRAMUSCULAR | Status: DC | PRN
  Administered 2022-07-08: 4 mg via INTRAVENOUS
  Filled 2022-07-08: qty 2

## 2022-07-08 MED ORDER — CHLORHEXIDINE GLUCONATE 0.12 % MT SOLN
15.0000 mL | Freq: Once | OROMUCOSAL | Status: AC
  Administered 2022-07-08: 15 mL via OROMUCOSAL

## 2022-07-08 MED ORDER — LIDOCAINE 2% (20 MG/ML) 5 ML SYRINGE
INTRAMUSCULAR | Status: AC
  Filled 2022-07-08: qty 5

## 2022-07-08 MED ORDER — HYDRALAZINE HCL 20 MG/ML IJ SOLN: 10.0000 mg | INTRAMUSCULAR | Status: DC | PRN

## 2022-07-08 MED ORDER — LACTATED RINGERS IV SOLN: INTRAVENOUS | Status: DC

## 2022-07-08 MED ORDER — DEXAMETHASONE SODIUM PHOSPHATE 10 MG/ML IJ SOLN
INTRAMUSCULAR | Status: AC
  Filled 2022-07-08: qty 1

## 2022-07-08 MED ORDER — SUCCINYLCHOLINE CHLORIDE 200 MG/10ML IV SOSY
PREFILLED_SYRINGE | INTRAVENOUS | Status: AC
  Filled 2022-07-08: qty 10

## 2022-07-08 MED ORDER — PROMETHAZINE HCL 25 MG/ML IJ SOLN: 6.2500 mg | INTRAMUSCULAR | Status: DC | PRN

## 2022-07-08 MED ORDER — ENOXAPARIN SODIUM 40 MG/0.4ML IJ SOSY
40.0000 mg | PREFILLED_SYRINGE | INTRAMUSCULAR | Status: DC
  Administered 2022-07-09: 40 mg via SUBCUTANEOUS
  Filled 2022-07-08: qty 0.4

## 2022-07-08 MED ORDER — FLUOXETINE HCL 20 MG PO CAPS
20.0000 mg | ORAL_CAPSULE | Freq: Every day | ORAL | Status: DC
  Administered 2022-07-09: 20 mg via ORAL
  Filled 2022-07-08: qty 1

## 2022-07-08 MED ORDER — ONDANSETRON HCL 4 MG/2ML IJ SOLN: 4.0000 mg | Freq: Four times a day (QID) | INTRAMUSCULAR | Status: DC | PRN

## 2022-07-08 MED ORDER — AMISULPRIDE (ANTIEMETIC) 5 MG/2ML IV SOLN: 10.0000 mg | Freq: Once | INTRAVENOUS | Status: DC | PRN

## 2022-07-08 MED ORDER — EZETIMIBE 10 MG PO TABS
10.0000 mg | ORAL_TABLET | Freq: Every day | ORAL | Status: DC
  Administered 2022-07-09: 10 mg via ORAL
  Filled 2022-07-08: qty 1

## 2022-07-08 MED ORDER — PROPOFOL 10 MG/ML IV BOLUS
INTRAVENOUS | Status: DC | PRN
  Administered 2022-07-08: 180 mg via INTRAVENOUS

## 2022-07-08 MED ORDER — MIDAZOLAM HCL 2 MG/2ML IJ SOLN
INTRAMUSCULAR | Status: DC | PRN
  Administered 2022-07-08: 2 mg via INTRAVENOUS

## 2022-07-08 MED ORDER — POLYETHYLENE GLYCOL 3350 17 G PO PACK: 17.0000 g | PACK | Freq: Every day | ORAL | Status: DC | PRN

## 2022-07-08 MED ORDER — SUCCINYLCHOLINE CHLORIDE 200 MG/10ML IV SOSY
PREFILLED_SYRINGE | INTRAVENOUS | Status: DC | PRN
  Administered 2022-07-08: 120 mg via INTRAVENOUS

## 2022-07-08 MED ORDER — ORAL CARE MOUTH RINSE: 15.0000 mL | Freq: Once | OROMUCOSAL | Status: AC

## 2022-07-08 MED ORDER — HYDROMORPHONE HCL 1 MG/ML IJ SOLN: 0.2500 mg | INTRAMUSCULAR | Status: DC | PRN

## 2022-07-08 MED ORDER — PANTOPRAZOLE SODIUM 40 MG IV SOLR
40.0000 mg | Freq: Every day | INTRAVENOUS | Status: DC
  Administered 2022-07-08: 40 mg via INTRAVENOUS
  Filled 2022-07-08: qty 10

## 2022-07-08 MED ORDER — OXYCODONE HCL 5 MG PO TABS
5.0000 mg | ORAL_TABLET | ORAL | Status: DC | PRN
  Administered 2022-07-09: 10 mg via ORAL
  Administered 2022-07-09 (×2): 5 mg via ORAL
  Filled 2022-07-08: qty 1
  Filled 2022-07-08: qty 2
  Filled 2022-07-08: qty 1

## 2022-07-08 MED ORDER — ONDANSETRON HCL 4 MG/2ML IJ SOLN
INTRAMUSCULAR | Status: DC | PRN
  Administered 2022-07-08: 4 mg via INTRAVENOUS

## 2022-07-08 MED ORDER — METHOCARBAMOL 1000 MG/10ML IJ SOLN: 500.0000 mg | Freq: Three times a day (TID) | INTRAVENOUS | Status: DC | PRN

## 2022-07-08 MED ORDER — ONDANSETRON HCL 4 MG/2ML IJ SOLN
INTRAMUSCULAR | Status: AC
  Filled 2022-07-08: qty 2

## 2022-07-08 MED ORDER — FENTANYL CITRATE (PF) 250 MCG/5ML IJ SOLN
INTRAMUSCULAR | Status: AC
  Filled 2022-07-08: qty 5

## 2022-07-08 MED ORDER — SODIUM CHLORIDE 0.9 % IR SOLN
Status: DC | PRN
  Administered 2022-07-08: 1000 mL

## 2022-07-08 MED ORDER — SIMETHICONE 80 MG PO CHEW: 40.0000 mg | CHEWABLE_TABLET | Freq: Four times a day (QID) | ORAL | Status: DC | PRN

## 2022-07-08 MED ORDER — FENTANYL CITRATE (PF) 250 MCG/5ML IJ SOLN
INTRAMUSCULAR | Status: DC | PRN
  Administered 2022-07-08: 100 ug via INTRAVENOUS
  Administered 2022-07-08 (×2): 50 ug via INTRAVENOUS

## 2022-07-08 MED ORDER — BUPIVACAINE HCL 0.25 % IJ SOLN
INTRAMUSCULAR | Status: DC | PRN
  Administered 2022-07-08: 20 mL

## 2022-07-08 MED ORDER — PROCHLORPERAZINE MALEATE 5 MG PO TABS: 10.0000 mg | ORAL_TABLET | Freq: Four times a day (QID) | ORAL | Status: DC | PRN

## 2022-07-08 MED ORDER — LIDOCAINE 2% (20 MG/ML) 5 ML SYRINGE
INTRAMUSCULAR | Status: DC | PRN
  Administered 2022-07-08: 60 mg via INTRAVENOUS

## 2022-07-08 MED ORDER — SODIUM CHLORIDE 0.9 % IV SOLN
2.0000 g | INTRAVENOUS | Status: DC
  Administered 2022-07-08: 2 g via INTRAVENOUS
  Filled 2022-07-08 (×2): qty 20

## 2022-07-08 MED ORDER — EPHEDRINE SULFATE-NACL 50-0.9 MG/10ML-% IV SOSY
PREFILLED_SYRINGE | INTRAVENOUS | Status: DC | PRN
  Administered 2022-07-08: 5 mg via INTRAVENOUS

## 2022-07-08 MED ORDER — ACETAMINOPHEN 325 MG PO TABS: 650.0000 mg | ORAL_TABLET | Freq: Four times a day (QID) | ORAL | Status: DC | PRN

## 2022-07-08 MED ORDER — ACETAMINOPHEN 650 MG RE SUPP: 650.0000 mg | Freq: Four times a day (QID) | RECTAL | Status: DC | PRN

## 2022-07-08 MED ORDER — MORPHINE SULFATE (PF) 2 MG/ML IV SOLN: 2.0000 mg | INTRAVENOUS | Status: DC | PRN

## 2022-07-08 MED ORDER — LEVOTHYROXINE SODIUM 100 MCG PO TABS
100.0000 ug | ORAL_TABLET | Freq: Every day | ORAL | Status: DC
  Administered 2022-07-09: 100 ug via ORAL
  Filled 2022-07-08: qty 1

## 2022-07-08 MED ORDER — MEPERIDINE HCL 25 MG/ML IJ SOLN: 6.2500 mg | INTRAMUSCULAR | Status: DC | PRN

## 2022-07-08 MED ORDER — EPHEDRINE 5 MG/ML INJ
INTRAVENOUS | Status: AC
  Filled 2022-07-08: qty 5

## 2022-07-08 MED ORDER — ONDANSETRON 4 MG PO TBDP: 4.0000 mg | ORAL_TABLET | Freq: Four times a day (QID) | ORAL | Status: DC | PRN

## 2022-07-08 MED ORDER — SUGAMMADEX SODIUM 200 MG/2ML IV SOLN
INTRAVENOUS | Status: DC | PRN
  Administered 2022-07-08: 200 mg via INTRAVENOUS

## 2022-07-08 SURGICAL SUPPLY — 60 items
ADH SKN CLS APL DERMABOND .7 (GAUZE/BANDAGES/DRESSINGS) ×2
APL PRP STRL LF DISP 70% ISPRP (MISCELLANEOUS) ×2
APL SKNCLS STERI-STRIP NONHPOA (GAUZE/BANDAGES/DRESSINGS) ×2
APPLIER CLIP ROT 10 11.4 M/L (STAPLE)
APR CLP MED LRG 11.4X10 (STAPLE)
BAG COUNTER SPONGE SURGICOUNT (BAG) ×3 IMPLANT
BAG SPEC RTRVL 10 TROC 200 (ENDOMECHANICALS) ×2
BAG SPNG CNTER NS LX DISP (BAG) ×2
BENZOIN TINCTURE PRP APPL 2/3 (GAUZE/BANDAGES/DRESSINGS) ×3 IMPLANT
BIOPATCH RED 1 DISK 7.0 (GAUZE/BANDAGES/DRESSINGS) ×1 IMPLANT
BLADE CLIPPER SURG (BLADE) IMPLANT
CANISTER SUCT 3000ML PPV (MISCELLANEOUS) IMPLANT
CHLORAPREP W/TINT 26 (MISCELLANEOUS) ×3 IMPLANT
CLIP APPLIE ROT 10 11.4 M/L (STAPLE) IMPLANT
COVER SURGICAL LIGHT HANDLE (MISCELLANEOUS) ×3 IMPLANT
CUTTER FLEX LINEAR 45M (STAPLE) ×3 IMPLANT
DERMABOND ADVANCED .7 DNX12 (GAUZE/BANDAGES/DRESSINGS) ×1 IMPLANT
DRAIN CHANNEL 19F RND (DRAIN) ×1 IMPLANT
DRSG TEGADERM 2-3/8X2-3/4 SM (GAUZE/BANDAGES/DRESSINGS) ×6 IMPLANT
DRSG TEGADERM 4X4.75 (GAUZE/BANDAGES/DRESSINGS) ×3 IMPLANT
ELECT REM PT RETURN 9FT ADLT (ELECTROSURGICAL) ×2
ELECTRODE REM PT RTRN 9FT ADLT (ELECTROSURGICAL) ×3 IMPLANT
ENDOLOOP SUT PDS II  0 18 (SUTURE)
ENDOLOOP SUT PDS II 0 18 (SUTURE) IMPLANT
EVACUATOR SILICONE 100CC (DRAIN) ×1 IMPLANT
GAUZE SPONGE 2X2 8PLY STRL LF (GAUZE/BANDAGES/DRESSINGS) ×3 IMPLANT
GLOVE BIO SURGEON STRL SZ7 (GLOVE) ×3 IMPLANT
GLOVE BIOGEL PI IND STRL 7.5 (GLOVE) ×3 IMPLANT
GOWN STRL REUS W/ TWL LRG LVL3 (GOWN DISPOSABLE) ×9 IMPLANT
GOWN STRL REUS W/TWL LRG LVL3 (GOWN DISPOSABLE) ×6
GRASPER SUT TROCAR 14GX15 (MISCELLANEOUS) ×1 IMPLANT
IRRIG SUCT STRYKERFLOW 2 WTIP (MISCELLANEOUS)
IRRIGATION SUCT STRKRFLW 2 WTP (MISCELLANEOUS) IMPLANT
KIT BASIN OR (CUSTOM PROCEDURE TRAY) ×3 IMPLANT
KIT TURNOVER KIT B (KITS) ×3 IMPLANT
NS IRRIG 1000ML POUR BTL (IV SOLUTION) ×3 IMPLANT
PAD ARMBOARD 7.5X6 YLW CONV (MISCELLANEOUS) ×6 IMPLANT
POUCH RETRIEVAL ECOSAC 10 (ENDOMECHANICALS) ×1 IMPLANT
POUCH RETRIEVAL ECOSAC 10MM (ENDOMECHANICALS) ×2
RELOAD STAPLE 45 3.5 BLU ETS (ENDOMECHANICALS) ×2 IMPLANT
RELOAD STAPLE TA45 3.5 REG BLU (ENDOMECHANICALS) ×2 IMPLANT
SCISSORS LAP 5X35 DISP (ENDOMECHANICALS) IMPLANT
SET TUBE SMOKE EVAC HIGH FLOW (TUBING) ×3 IMPLANT
SHEARS HARMONIC ACE PLUS 36CM (ENDOMECHANICALS) ×3 IMPLANT
SLEEVE Z-THREAD 5X100MM (TROCAR) ×3 IMPLANT
SPECIMEN JAR SMALL (MISCELLANEOUS) ×3 IMPLANT
STRIP CLOSURE SKIN 1/2X4 (GAUZE/BANDAGES/DRESSINGS) ×3 IMPLANT
SUT ETHILON 2 0 FS 18 (SUTURE) ×1 IMPLANT
SUT MNCRL AB 4-0 PS2 18 (SUTURE) ×3 IMPLANT
SUT SILK 2 0 SH (SUTURE) ×1 IMPLANT
SYS BAG RETRIEVAL 10MM (BASKET) ×2
SYSTEM BAG RETRIEVAL 10MM (BASKET) ×3 IMPLANT
TOWEL GREEN STERILE (TOWEL DISPOSABLE) ×3 IMPLANT
TOWEL GREEN STERILE FF (TOWEL DISPOSABLE) ×3 IMPLANT
TRAY LAPAROSCOPIC MC (CUSTOM PROCEDURE TRAY) ×3 IMPLANT
TROCAR BALLN 12MMX100 BLUNT (TROCAR) ×3 IMPLANT
TROCAR OPTI TIP 5M 100M (ENDOMECHANICALS) IMPLANT
TROCAR XCEL BLADELESS 5X75MML (TROCAR) ×1 IMPLANT
TROCAR Z-THREAD OPTICAL 5X100M (TROCAR) ×3 IMPLANT
WATER STERILE IRR 1000ML POUR (IV SOLUTION) ×3 IMPLANT
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
## 2022-07-08 NOTE — ED Notes (Signed)
Report called to OR holding 

## 2022-07-08 NOTE — Transfer of Care (Signed)
Immediate Anesthesia Transfer of Care Note  Patient: Tricia Miranda  Procedure(s) Performed: APPENDECTOMY LAPAROSCOPIC (Abdomen)  Patient Location: PACU  Anesthesia Type:General  Level of Consciousness: awake, alert , and oriented  Airway & Oxygen Therapy: Patient Spontanous Breathing and Patient connected to nasal cannula oxygen  Post-op Assessment: Report given to RN, Post -op Vital signs reviewed and stable, and Patient moving all extremities  Post vital signs: Reviewed and stable  Last Vitals:  Vitals Value Taken Time  BP 98/64 07/08/22 1930  Temp    Pulse 70 07/08/22 1934  Resp 20 07/08/22 1934  SpO2 98 % 07/08/22 1934  Vitals shown include unvalidated device data.  Last Pain:  Vitals:   07/08/22 1523  TempSrc: Oral  PainSc:          Complications: No notable events documented.

## 2022-07-08 NOTE — Progress Notes (Signed)
Patient ID: Tricia Miranda, female   DOB: [birth year], 57 y.o.   MRN: [id number] I have seen and examined the patient.  I think she does have appendicitis and this is not related to trauma.  She has rlq tenderness and ct with appendicitis and elevated wbc.  We discussed a lap appy tonight.
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
## 2022-07-08 NOTE — Anesthesia Procedure Notes (Signed)
Procedure Name: Intubation Date/Time: 07/08/2022 6:09 PM  Performed by: Amadeo Garnet, CRNAPre-anesthesia Checklist: Patient identified, Emergency Drugs available, Suction available and Patient being monitored Patient Re-evaluated:Patient Re-evaluated prior to induction Oxygen Delivery Method: Circle system utilized Preoxygenation: Pre-oxygenation with 100% oxygen Induction Type: IV induction and Rapid sequence Laryngoscope Size: Mac and 3 Grade View: Grade I Tube type: Oral Tube size: 7.0 mm Number of attempts: 1 Airway Equipment and Method: Stylet and Oral airway Placement Confirmation: ETT inserted through vocal cords under direct vision, positive ETCO2 and breath sounds checked- equal and bilateral Secured at: 22 cm Tube secured with: Tape Dental Injury: Teeth and Oropharynx as per pre-operative assessment
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
## 2022-07-08 NOTE — Anesthesia Preprocedure Evaluation (Addendum)
Anesthesia Evaluation  Patient identified by MRN, date of birth, ID band Patient awake    Reviewed: Allergy & Precautions, H&P , NPO status , Patient's Chart, lab work & pertinent test results  History of Anesthesia Complications (+) PONV and history of anesthetic complications  Airway Mallampati: II  TM Distance: >3 FB Neck ROM: Full    Dental  (+) Dental Advisory Given,    Pulmonary sleep apnea    Pulmonary exam normal        Cardiovascular negative cardio ROS Normal cardiovascular exam     Neuro/Psych  Headaches PSYCHIATRIC DISORDERS Anxiety        GI/Hepatic   Endo/Other  Hypothyroidism   Obesity  Renal/GU      Musculoskeletal  (+) Arthritis ,    Abdominal   Peds  Hematology   Anesthesia Other Findings   Reproductive/Obstetrics                             Anesthesia Physical Anesthesia Plan  ASA: 3  Anesthesia Plan: General   Post-op Pain Management: Ofirmev IV (intra-op)*   Induction: Intravenous, Rapid sequence and Cricoid pressure planned  PONV Risk Score and Plan: 3 and Ondansetron, Dexamethasone, Midazolam and Treatment may vary due to age or medical condition  Airway Management Planned: Oral ETT  Additional Equipment: None  Intra-op Plan:   Post-operative Plan: Extubation in OR  Informed Consent: I have reviewed the patients History and Physical, chart, labs and discussed the procedure including the risks, benefits and alternatives for the proposed anesthesia with the patient or authorized representative who has indicated his/her understanding and acceptance.     Dental advisory given  Plan Discussed with: CRNA and Anesthesiologist  Anesthesia Plan Comments:         Anesthesia Quick Evaluation

## 2022-07-09 ENCOUNTER — Encounter (HOSPITAL_COMMUNITY): Payer: Self-pay | Admitting: General Surgery

## 2022-07-09 LAB — HIV ANTIBODY (ROUTINE TESTING W REFLEX): HIV Screen 4th Generation wRfx: NONREACTIVE

## 2022-07-09 MED ORDER — METRONIDAZOLE 500 MG PO TABS: 500.0000 mg | ORAL_TABLET | Freq: Two times a day (BID) | ORAL | Status: DC

## 2022-07-09 MED ORDER — PHENYLEPHRINE HCL-NACL 20-0.9 MG/250ML-% IV SOLN
INTRAVENOUS | Status: AC
  Filled 2022-07-09: qty 1000

## 2022-07-09 MED ORDER — PANTOPRAZOLE SODIUM 40 MG PO TBEC: 40.0000 mg | DELAYED_RELEASE_TABLET | Freq: Every day | ORAL | Status: DC

## 2022-07-09 MED ORDER — METHOCARBAMOL 500 MG PO TABS: 500.0000 mg | ORAL_TABLET | Freq: Three times a day (TID) | ORAL | 0 refills | Status: AC | PRN

## 2022-07-09 MED ORDER — OXYCODONE HCL 5 MG PO TABS: 5.0000 mg | ORAL_TABLET | Freq: Four times a day (QID) | ORAL | 0 refills | Status: DC | PRN

## 2022-07-09 MED ORDER — POLYETHYLENE GLYCOL 3350 17 G PO PACK: 17.0000 g | PACK | Freq: Every day | ORAL | 0 refills | Status: DC | PRN

## 2022-07-09 MED ORDER — AMOXICILLIN-POT CLAVULANATE 875-125 MG PO TABS: 1.0000 | ORAL_TABLET | Freq: Two times a day (BID) | ORAL | 0 refills | Status: AC

## 2022-07-09 MED ORDER — DOCUSATE SODIUM 100 MG PO CAPS: 100.0000 mg | ORAL_CAPSULE | Freq: Two times a day (BID) | ORAL | Status: DC | PRN

## 2022-07-09 MED ORDER — ACETAMINOPHEN 325 MG PO TABS: 650.0000 mg | ORAL_TABLET | Freq: Four times a day (QID) | ORAL | Status: DC | PRN

## 2022-07-09 NOTE — Discharge Instructions (Signed)
[facility]  Please arrive at least 30 min before your appointment to complete your check in paperwork.  If you are unable to arrive 30 min prior to your appointment time we may have to cancel or reschedule you. LAPAROSCOPIC SURGERY: POST OP INSTRUCTIONS Always review your discharge instruction sheet given to you by the facility where your surgery was performed. IF YOU HAVE DISABILITY OR FAMILY LEAVE FORMS, YOU MUST BRING THEM TO THE OFFICE FOR PROCESSING.   DO NOT GIVE THEM TO YOUR DOCTOR.  PAIN CONTROL  First take acetaminophen (Tylenol) AND/or ibuprofen (Advil) to control your pain after surgery.  Follow directions on package.  Taking acetaminophen (Tylenol) and/or ibuprofen (Advil) regularly after surgery will help to control your pain and lower the amount of prescription pain medication you may need.  You should not take more than 4,000 mg (4 grams) of acetaminophen (Tylenol) in 24 hours.  You should not take ibuprofen (Advil), aleve, motrin, naprosyn or other NSAIDS if you have a history of stomach ulcers or chronic kidney disease.  A prescription for pain medication may be given to you upon discharge.  Take your pain medication as prescribed, if you still have uncontrolled pain after taking acetaminophen (Tylenol) or ibuprofen (Advil). Use ice packs to help control pain. If you need a refill on your pain medication, please contact your pharmacy.  They will contact our office to request authorization. Prescriptions will not be filled after 5pm or on week-ends.  HOME MEDICATIONS Take your usually prescribed medications unless otherwise directed.  DIET You should follow a light diet the first few days after arrival home.  Be sure to include lots of fluids daily. Avoid fatty, fried foods.   CONSTIPATION It is common to experience some constipation after surgery and if you are taking pain medication.  Increasing fluid intake and taking a stool softener (such as Colace)  will usually help or prevent this problem from occurring.  A mild laxative (Milk of Magnesia or Miralax) should be taken according to package instructions if there are no bowel movements after 48 hours.  WOUND/INCISION CARE Most patients will experience some swelling and bruising in the area of the incisions.  Ice packs will help.  Swelling and bruising can take several days to resolve.  Unless discharge instructions indicate otherwise, follow guidelines below  STERI-STRIPS - you may remove your outer bandages 48 hours after surgery, and you may shower at that time.  You have steri-strips (small skin tapes) in place directly over the incision.  These strips should be left on the skin for 7-10 days.   DERMABOND/SKIN GLUE - you may shower in 24 hours.  The glue will flake off over the next 2-3 weeks. Any sutures or staples will be removed at the office during your follow-up visit.  ACTIVITIES You may resume regular (light) daily activities beginning the next day--such as daily self-care, walking, climbing stairs--gradually increasing activities as tolerated.  You may have sexual intercourse when it is comfortable.  Refrain from any heavy lifting or straining until approved by your doctor. You may drive when you are no longer taking prescription pain medication, you can comfortably wear a seatbelt, and you can safely maneuver your car and apply brakes.  FOLLOW-UP You should see your doctor in the office for a follow-up appointment approximately 2-3 weeks after your surgery.  You should have been given your post-op/follow-up appointment when your surgery was scheduled.  If you did not receive a post-op/follow-up appointment, make sure   that you call for this appointment within a day or two after you arrive home to insure a convenient appointment time.   WHEN TO CALL YOUR DOCTOR: Fever over 101.0 Inability to urinate Continued bleeding from incision. Increased pain, redness, or drainage from the  incision. Increasing abdominal pain  The clinic staff is available to answer your questions during regular business hours.  Please don't hesitate to call and ask to speak to one of the nurses for clinical concerns.  If you have a medical emergency, go to the nearest emergency room or call 911.  A surgeon from [facility] is always on call at the hospital. [address], [address], [place], [place]  [address] ? [address] [address], [place], [place]   [address] [phone] ? [phone] ? FAX [phone]     Managing Your Pain After Surgery Without Opioids    Thank you for participating in our program to help patients manage their pain after surgery without opioids. This is part of our effort to provide you with the best care possible, without exposing you or your family to the risk that opioids pose.  What pain can I expect after surgery? You can expect to have some pain after surgery. This is normal. The pain is typically worse the day after surgery, and quickly begins to get better. Many studies have found that many patients are able to manage their pain after surgery with Over-the-Counter (OTC) medications such as Tylenol and Motrin. If you have a condition that does not allow you to take Tylenol or Motrin, notify your surgical team.  How will I manage my pain? The best strategy for controlling your pain after surgery is around the clock pain control with Tylenol (acetaminophen) and Motrin (ibuprofen or Advil). Alternating these medications with each other allows you to maximize your pain control. In addition to Tylenol and Motrin, you can use heating pads or ice packs on your incisions to help reduce your pain.  How will I alternate your regular strength over-the-counter pain medication? You will take a dose of pain medication every three hours. Start by taking 650 mg of Tylenol (2 pills of 325 mg) 3 hours later take 600 mg of Motrin (3 pills of 200 mg) 3 hours after  taking the Motrin take 650 mg of Tylenol 3 hours after that take 600 mg of Motrin.   - 1 -  See example - if your first dose of Tylenol is at 12:00 PM   12:00 PM Tylenol 650 mg (2 pills of 325 mg)  3:00 PM Motrin 600 mg (3 pills of 200 mg)  6:00 PM Tylenol 650 mg (2 pills of 325 mg)  9:00 PM Motrin 600 mg (3 pills of 200 mg)  Continue alternating every 3 hours   We recommend that you follow this schedule around-the-clock for at least 3 days after surgery, or until you feel that it is no longer needed. Use the table on the last page of this handout to keep track of the medications you are taking. Important: Do not take more than 3000mg of Tylenol or 3200mg of Motrin in a 24-hour period. Do not take ibuprofen/Motrin if you have a history of bleeding stomach ulcers, severe kidney disease, &/or actively taking a blood thinner  What if I still have pain? If you have pain that is not controlled with the over-the-counter pain medications (Tylenol and Motrin or Advil) you might have what we call "breakthrough" pain. You will receive a prescription   for a small amount of an opioid pain medication such as Oxycodone, Tramadol, or Tylenol with Codeine. Use these opioid pills in the first 24 hours after surgery if you have breakthrough pain. Do not take more than 1 pill every 4-6 hours.  If you still have uncontrolled pain after using all opioid pills, don't hesitate to call our staff using the number provided. We will help make sure you are managing your pain in the best way possible, and if necessary, we can provide a prescription for additional pain medication.   Day 1    Time  Name of Medication Number of pills taken  Amount of Acetaminophen  Pain Level   Comments  AM PM       AM PM       AM PM       AM PM       AM PM       AM PM       AM PM       AM PM       Total Daily amount of Acetaminophen Do not take more than  3,000 mg per day      Day 2    Time  Name of Medication  Number of pills taken  Amount of Acetaminophen  Pain Level   Comments  AM PM       AM PM       AM PM       AM PM       AM PM       AM PM       AM PM       AM PM       Total Daily amount of Acetaminophen Do not take more than  3,000 mg per day      Day 3    Time  Name of Medication Number of pills taken  Amount of Acetaminophen  Pain Level   Comments  AM PM       AM PM       AM PM       AM PM         AM PM       AM PM       AM PM       AM PM       Total Daily amount of Acetaminophen Do not take more than  3,000 mg per day      Day 4    Time  Name of Medication Number of pills taken  Amount of Acetaminophen  Pain Level   Comments  AM PM       AM PM       AM PM       AM PM       AM PM       AM PM       AM PM       AM PM       Total Daily amount of Acetaminophen Do not take more than  3,000 mg per day      Day 5    Time  Name of Medication Number of pills taken  Amount of Acetaminophen  Pain Level   Comments  AM PM       AM PM       AM PM       AM PM       AM PM       AM PM         AM PM       AM PM       Total Daily amount of Acetaminophen Do not take more than  3,000 mg per day      Day 6    Time  Name of Medication Number of pills taken  Amount of Acetaminophen  Pain Level  Comments  AM PM       AM PM       AM PM       AM PM       AM PM       AM PM       AM PM       AM PM       Total Daily amount of Acetaminophen Do not take more than  3,000 mg per day      Day 7    Time  Name of Medication Number of pills taken  Amount of Acetaminophen  Pain Level   Comments  AM PM       AM PM       AM PM       AM PM       AM PM       AM PM       AM PM       AM PM       Total Daily amount of Acetaminophen Do not take more than  3,000 mg per day        For additional information about how and where to safely dispose of unused opioid medications - https://www.morepowerfulnc.org  Disclaimer: This document contains  information and/or instructional materials adapted from [facility] for the typical patient with your condition. It does not replace medical advice from your health care provider because your experience may differ from that of the typical patient. Talk to your health care provider if you have any questions about this document, your condition or your treatment plan. Adapted from [facility]  

## 2022-07-09 NOTE — Anesthesia Postprocedure Evaluation (Signed)
Anesthesia Post Note  Patient: Tricia Miranda  Procedure(s) Performed: APPENDECTOMY LAPAROSCOPIC (Abdomen)     Patient location during evaluation: PACU Anesthesia Type: General Level of consciousness: awake Pain management: pain level controlled Vital Signs Assessment: post-procedure vital signs reviewed and stable Respiratory status: spontaneous breathing, nonlabored ventilation and respiratory function stable Cardiovascular status: blood pressure returned to baseline and stable Postop Assessment: no apparent nausea or vomiting Anesthetic complications: no   No notable events documented.  Last Vitals:  Vitals:   07/08/22 2016 07/08/22 2055  BP: 121/68 97/62  Pulse: 66 72  Resp: 18 18  Temp: 37.7 C 36.7 C  SpO2: 97% 96%    Last Pain:  Vitals:   07/08/22 2055  TempSrc: Oral  PainSc: 0-No pain                 Solina Heron P Chukwuemeka Artola

## 2022-07-09 NOTE — Progress Notes (Signed)
PHARMACIST - PHYSICIAN COMMUNICATION DR:   Georgette Dover CONCERNING: Antibiotic IV to Oral Route Change Policy  RECOMMENDATION: This patient is receiving protonix/flagyl by the intravenous route.  Based on criteria approved by the Pharmacy and Therapeutics Committee, the antibiotic(s) is/are being converted to the equivalent oral dose form(s).   DESCRIPTION: These criteria include: Patient being treated for a respiratory tract infection, urinary tract infection, cellulitis or clostridium difficile associated diarrhea if on metronidazole The patient is not neutropenic and does not exhibit a GI malabsorption state The patient is eating (either orally or via tube) and/or has been taking other orally administered medications for a least 24 hours The patient is improving clinically and has a Tmax < 100.5  If you have questions about this conversion, please contact the Pharmacy Department  []   [phone] )  [facility] []   [phone] )  [facility] [x]   [phone] )  [facility] []   [phone] )  [facility] []   [phone] )  [facility]

## 2022-07-09 NOTE — TOC Initial Note (Signed)
Transition of Care [facility]) - Initial/Assessment Note    Patient Details  Name: Tricia Miranda MRN: [id number] Date of Birth: 07-16-1965  Transition of Care [facility]) CM/SW Contact:    Verdell Carmine, RN Phone Number: 07/09/2022, 9:13 AM  Clinical Narrative:                  Transitions of Care(TOC) has reviewed the patient and found no needs at this time. The patient will be discussed in daily progression rounds. If a need is identified, please place a TOC consult       Patient Goals and CMS Choice            Expected Discharge Plan and Services                                              Prior Living Arrangements/Services                       Activities of Daily Living Home Assistive Devices/Equipment: None ADL Screening (condition at time of admission) Patient's cognitive ability adequate to safely complete daily activities?: No Is the patient deaf or have difficulty hearing?: No Does the patient have difficulty seeing, even when wearing glasses/contacts?: No Does the patient have difficulty concentrating, remembering, or making decisions?: No Patient able to express need for assistance with ADLs?: No Does the patient have difficulty dressing or bathing?: No Independently performs ADLs?: Yes (appropriate for developmental age) Does the patient have difficulty walking or climbing stairs?: No Weakness of Legs: None Weakness of Arms/Hands: None  Permission Sought/Granted                  Emotional Assessment              Admission diagnosis:  Acute appendicitis [K35.80] Acute appendicitis with generalized peritonitis, unspecified whether abscess present, unspecified whether gangrene present, unspecified whether perforation present [K35.209] S/P laparoscopic appendectomy [Z90.49] Patient Active Problem List   Diagnosis Date Noted   Acute appendicitis 07/08/2022   S/P laparoscopic appendectomy 07/08/2022   GAD (generalized anxiety  disorder) 02/23/2022   Vitamin B12 deficiency 02/23/2022   Upset stomach 10/16/2021   Other hyperlipidemia 10/16/2021   Class 2 severe obesity with serious comorbidity and body mass index (BMI) of 35.0 to 35.9 in adult ([place]) 10/16/2021   Vitamin D deficiency 08/12/2020   Family history of colonic polyps 05/31/2020   Irritable bowel syndrome 05/31/2020   Postoperative state 11/15/2018   Post-operative state 11/15/2018   Snoring 11/23/2017   Diarrhea 06/23/2012   History of vitamin D deficiency 11/14/2011   Essential tremor 11/14/2011   Hypothyroidism 10/09/2010   Anxiety 10/09/2010   Migraine 10/09/2010   PCP:  Elby Showers, MD Pharmacy:   [id number] - [place], [place] - [address] [place] [address] Phone: [phone] Fax: [phone]     Social Determinants of Health (SDOH) Social History: SDOH Screenings   Food Insecurity: No Food Insecurity (07/08/2022)  Housing: Low Risk  (07/08/2022)  Transportation Needs: No Transportation Needs (07/08/2022)  Utilities: Not At Risk (07/08/2022)  Depression (PHQ2-9): Low Risk  (04/24/2022)  Tobacco Use: Low Risk  (07/09/2022)   SDOH Interventions:     Readmission Risk Interventions     No data to display
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
## 2022-07-10 ENCOUNTER — Telehealth: Payer: Self-pay

## 2022-07-10 LAB — SURGICAL PATHOLOGY
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
## 2022-07-10 NOTE — Transitions of Care (Post Inpatient/ED Visit) (Signed)
   07/10/2022  Name: Tricia Miranda MRN: [id number] DOB: Jun 25, 1965  Today's TOC FU Call Status: Today's TOC FU Call Status:: Successful TOC FU Call Competed TOC FU Call Complete Date: 07/10/22  Transition Care Management Follow-up Telephone Call Date of Discharge: 07/08/22 Discharge [facility]) Type of Discharge: Emergency Department Reason for ED Visit: Other: How have you been since you were released from the hospital?: Better Any questions or concerns?: No  Items Reviewed: Did you receive and understand the discharge instructions provided?: Yes Medications obtained and verified?: Yes (Medications Reviewed) Any new allergies since your discharge?: No Dietary orders reviewed?: NA Do you have support at home?: Yes People in Home: parent(s), sibling(s)  Home Care and Equipment/Supplies: [place] Ordered?: No Any new equipment or medical supplies ordered?: No  Functional Questionnaire: Do you need assistance with bathing/showering or dressing?: No Do you need assistance with meal preparation?: No Do you need assistance with eating?: No Do you have difficulty maintaining continence: No Do you need assistance with getting out of bed/getting out of a chair/moving?: No Do you have difficulty managing or taking your medications?: No  Follow up appointments reviewed: PCP Follow-up appointment confirmed?: Yes Date of PCP follow-up appointment?: 07/13/22 Follow-up Provider: Cresenciano Lick. [facility] Follow-up appointment confirmed?: Yes Date of Specialist follow-up appointment?: 07/30/22 Follow-Up Specialty Provider:: [facility] Do you need transportation to your follow-up appointment?: No Do you understand care options if your condition(s) worsen?: Yes-patient verbalized understanding    SIGNATURE Janielle Mittelstadt D, CMA

## 2022-07-13 ENCOUNTER — Encounter: Payer: Self-pay | Admitting: Internal Medicine

## 2022-07-13 ENCOUNTER — Ambulatory Visit: Payer: No Typology Code available for payment source | Admitting: Internal Medicine

## 2022-07-13 VITALS — BP 106/70 | HR 68 | Temp 98.4°F | Ht 67.0 in | Wt 214.4 lb

## 2022-07-13 DIAGNOSIS — D72829 Elevated white blood cell count, unspecified: Secondary | ICD-10-CM

## 2022-07-13 LAB — CBC WITH DIFFERENTIAL/PLATELET
Absolute Monocytes: 397 cells/uL (ref 200–950)
Basophils Absolute: 52 cells/uL (ref 0–200)
Basophils Relative: 0.8 %
Eosinophils Absolute: 169 cells/uL (ref 15–500)
Eosinophils Relative: 2.6 %
HCT: 42.8 % (ref 35.0–45.0)
Hemoglobin: 14.3 g/dL (ref 11.7–15.5)
Lymphs Abs: 2405 cells/uL (ref 850–3900)
MCH: 28.2 pg (ref 27.0–33.0)
MCHC: 33.4 g/dL (ref 32.0–36.0)
MCV: 84.4 fL (ref 80.0–100.0)
MPV: 9.6 fL (ref 7.5–12.5)
Monocytes Relative: 6.1 %
Neutro Abs: 3478 cells/uL (ref 1500–7800)
Neutrophils Relative %: 53.5 %
Platelets: 372 10*3/uL (ref 140–400)
RBC: 5.07 10*6/uL (ref 3.80–5.10)
RDW: 12.7 % (ref 11.0–15.0)
Total Lymphocyte: 37 %
WBC: 6.5 10*3/uL (ref 3.8–10.8)

## 2022-07-13 MED ORDER — HYDROCODONE-ACETAMINOPHEN 5-325 MG PO TABS: 1.0000 | ORAL_TABLET | Freq: Three times a day (TID) | ORAL | 0 refills | Status: DC | PRN
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
## 2022-07-14 ENCOUNTER — Ambulatory Visit (INDEPENDENT_AMBULATORY_CARE_PROVIDER_SITE_OTHER): Payer: No Typology Code available for payment source | Admitting: Adult Health

## 2022-07-14 ENCOUNTER — Encounter (INDEPENDENT_AMBULATORY_CARE_PROVIDER_SITE_OTHER): Payer: Self-pay | Admitting: Adult Health

## 2022-07-14 VITALS — BP 107/72 | HR 63 | Temp 98.6°F | Ht 67.0 in | Wt 210.0 lb

## 2022-07-14 DIAGNOSIS — Z6833 Body mass index (BMI) 33.0-33.9, adult: Secondary | ICD-10-CM | POA: Diagnosis not present

## 2022-07-14 DIAGNOSIS — E669 Obesity, unspecified: Secondary | ICD-10-CM | POA: Diagnosis not present

## 2022-07-14 DIAGNOSIS — Z9049 Acquired absence of other specified parts of digestive tract: Secondary | ICD-10-CM

## 2022-07-14 DIAGNOSIS — E559 Vitamin D deficiency, unspecified: Secondary | ICD-10-CM

## 2022-07-14 DIAGNOSIS — E88819 Insulin resistance, unspecified: Secondary | ICD-10-CM | POA: Diagnosis not present

## 2022-07-14 MED ORDER — ERGOCALCIFEROL 1.25 MG (50000 UT) PO CAPS: 50000.0000 [IU] | ORAL_CAPSULE | ORAL | 0 refills | Status: DC
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
## 2022-07-16 NOTE — Patient Instructions (Signed)
Prescribed Norco 5/325 every 8 hours as needed. Check CBC with diff. Follow up with surgeon soon. May ice shoulder 20 minutes twide daily. Can order Pt if not improving.
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
## 2022-08-03 ENCOUNTER — Other Ambulatory Visit: Payer: No Typology Code available for payment source

## 2022-08-03 DIAGNOSIS — E7800 Pure hypercholesterolemia, unspecified: Secondary | ICD-10-CM

## 2022-08-03 DIAGNOSIS — E039 Hypothyroidism, unspecified: Secondary | ICD-10-CM

## 2022-08-03 DIAGNOSIS — Z0000 Encounter for general adult medical examination without abnormal findings: Secondary | ICD-10-CM

## 2022-08-03 DIAGNOSIS — Z136 Encounter for screening for cardiovascular disorders: Secondary | ICD-10-CM

## 2022-08-04 LAB — CBC WITH DIFFERENTIAL/PLATELET
Absolute Monocytes: 312 cells/uL (ref 200–950)
Basophils Absolute: 52 cells/uL (ref 0–200)
Basophils Relative: 1 %
Eosinophils Absolute: 218 cells/uL (ref 15–500)
Eosinophils Relative: 4.2 %
HCT: 40.3 % (ref 35.0–45.0)
Hemoglobin: 13.2 g/dL (ref 11.7–15.5)
Lymphs Abs: 2220 cells/uL (ref 850–3900)
MCH: 27.8 pg (ref 27.0–33.0)
MCHC: 32.8 g/dL (ref 32.0–36.0)
MCV: 85 fL (ref 80.0–100.0)
MPV: 10.2 fL (ref 7.5–12.5)
Monocytes Relative: 6 %
Neutro Abs: 2397 cells/uL (ref 1500–7800)
Neutrophils Relative %: 46.1 %
Platelets: 254 10*3/uL (ref 140–400)
RBC: 4.74 10*6/uL (ref 3.80–5.10)
RDW: 12.4 % (ref 11.0–15.0)
Total Lymphocyte: 42.7 %
WBC: 5.2 10*3/uL (ref 3.8–10.8)

## 2022-08-04 LAB — LIPID PANEL
Cholesterol: 212 mg/dL — ABNORMAL HIGH (ref <200)
HDL: 53 mg/dL (ref >=50)
LDL Cholesterol (Calc): 131 mg/dL (calc) — ABNORMAL HIGH
Non-HDL Cholesterol (Calc): 159 mg/dL (calc) — ABNORMAL HIGH (ref <130)
Total CHOL/HDL Ratio: 4 (calc) (ref <5.0)
Triglycerides: 165 mg/dL — ABNORMAL HIGH (ref <150)

## 2022-08-04 LAB — COMPLETE METABOLIC PANEL WITH GFR
AG Ratio: 1.7 (calc) (ref 1.0–2.5)
ALT: 14 U/L (ref 6–29)
AST: 11 U/L (ref 10–35)
Albumin: 4.2 g/dL (ref 3.6–5.1)
Alkaline phosphatase (APISO): 83 U/L (ref 37–153)
BUN: 16 mg/dL (ref 7–25)
CO2: 26 mmol/L (ref 20–32)
Calcium: 9.2 mg/dL (ref 8.6–10.4)
Chloride: 105 mmol/L (ref 98–110)
Creat: 0.55 mg/dL (ref 0.50–1.03)
Globulin: 2.5 g/dL (calc) (ref 1.9–3.7)
Glucose, Bld: 93 mg/dL (ref 65–99)
Potassium: 4.6 mmol/L (ref 3.5–5.3)
Sodium: 140 mmol/L (ref 135–146)
Total Bilirubin: 0.5 mg/dL (ref 0.2–1.2)
Total Protein: 6.7 g/dL (ref 6.1–8.1)
eGFR: 108 mL/min/{1.73_m2} (ref >=60)

## 2022-08-04 LAB — TSH: TSH: 0.68 mIU/L (ref 0.40–4.50)

## 2022-08-06 ENCOUNTER — Ambulatory Visit (INDEPENDENT_AMBULATORY_CARE_PROVIDER_SITE_OTHER): Payer: No Typology Code available for payment source | Admitting: Internal Medicine

## 2022-08-06 ENCOUNTER — Encounter: Payer: Self-pay | Admitting: Internal Medicine

## 2022-08-06 VITALS — BP 106/70 | HR 66 | Temp 98.6°F | Ht 67.0 in | Wt 214.8 lb

## 2022-08-06 DIAGNOSIS — Z0000 Encounter for general adult medical examination without abnormal findings: Secondary | ICD-10-CM

## 2022-08-06 DIAGNOSIS — E538 Deficiency of other specified B group vitamins: Secondary | ICD-10-CM

## 2022-08-06 DIAGNOSIS — E559 Vitamin D deficiency, unspecified: Secondary | ICD-10-CM

## 2022-08-06 DIAGNOSIS — Z9049 Acquired absence of other specified parts of digestive tract: Secondary | ICD-10-CM

## 2022-08-06 DIAGNOSIS — F32A Depression, unspecified: Secondary | ICD-10-CM

## 2022-08-06 DIAGNOSIS — Z6833 Body mass index (BMI) 33.0-33.9, adult: Secondary | ICD-10-CM | POA: Diagnosis not present

## 2022-08-06 DIAGNOSIS — E039 Hypothyroidism, unspecified: Secondary | ICD-10-CM | POA: Diagnosis not present

## 2022-08-06 DIAGNOSIS — E782 Mixed hyperlipidemia: Secondary | ICD-10-CM

## 2022-08-06 DIAGNOSIS — M7918 Myalgia, other site: Secondary | ICD-10-CM

## 2022-08-06 DIAGNOSIS — F419 Anxiety disorder, unspecified: Secondary | ICD-10-CM

## 2022-08-06 LAB — POCT URINALYSIS DIPSTICK
Bilirubin, UA: NEGATIVE
Glucose, UA: NEGATIVE
Ketones, UA: NEGATIVE
Leukocytes, UA: NEGATIVE
Nitrite, UA: NEGATIVE
Protein, UA: NEGATIVE
Spec Grav, UA: 1.01 (ref 1.010–1.025)
Urobilinogen, UA: 0.2 E.U./dL
pH, UA: 5 (ref 5.0–8.0)

## 2022-08-11 ENCOUNTER — Ambulatory Visit (INDEPENDENT_AMBULATORY_CARE_PROVIDER_SITE_OTHER): Payer: No Typology Code available for payment source | Admitting: Adult Health

## 2022-08-11 ENCOUNTER — Encounter (INDEPENDENT_AMBULATORY_CARE_PROVIDER_SITE_OTHER): Payer: Self-pay | Admitting: Adult Health

## 2022-08-11 VITALS — BP 131/83 | HR 80 | Temp 98.8°F | Ht 67.0 in | Wt 210.0 lb

## 2022-08-11 DIAGNOSIS — E669 Obesity, unspecified: Secondary | ICD-10-CM

## 2022-08-11 DIAGNOSIS — E88819 Insulin resistance, unspecified: Secondary | ICD-10-CM

## 2022-08-11 DIAGNOSIS — E559 Vitamin D deficiency, unspecified: Secondary | ICD-10-CM

## 2022-08-11 DIAGNOSIS — E782 Mixed hyperlipidemia: Secondary | ICD-10-CM

## 2022-08-11 DIAGNOSIS — Z6832 Body mass index (BMI) 32.0-32.9, adult: Secondary | ICD-10-CM

## 2022-08-11 MED ORDER — ERGOCALCIFEROL 1.25 MG (50000 UT) PO CAPS: 50000.0000 [IU] | ORAL_CAPSULE | ORAL | 0 refills | Status: DC

## 2022-08-11 NOTE — Progress Notes (Signed)
WEIGHT SUMMARY AND BIOMETRICS  Vitals Temp: 98.8 F (37.1 C) BP: 131/83 Pulse Rate: 80 SpO2: 93 %   Anthropometric Measurements Height:  (1.702 m) Weight: 210 lb (95.3 kg) BMI (Calculated): 32.88 Weight at Last Visit: 210lb Weight Lost Since Last Visit: 0 Weight Gained Since Last Visit: 0 Starting Weight: 224lb Total Weight Loss (lbs): 14 lb (6.35 kg)   Body Composition  Body Fat %: 41.7 % Fat Mass (lbs): 87.8 lbs Muscle Mass (lbs): 116.6 lbs Total Body Water (lbs): 76.4 lbs Visceral Fat Rating : 11   Other Clinical Data Fasting: yes Labs: No Today's Visit #: 30 Starting Date: 06/18/20    Chief Complaint:   OBESITY Noah is here to discuss her progress with her obesity treatment plan. She is on the the Category 3 Plan and states she is following her eating plan approximately 80 % of the time.  She states she is exercising walking 30 minutes 4 times per week.   Interim History:  She had fasting labs and annual physical with PCP/Dr. Lenord Fellers - mid April 2024.  Ms. Ramey has returned to work FT, experiencing fatigue on weekends- napping more than usual for her.   Subjective:   1. Mixed hyperlipidemia Lipid Panel     Component Value Date/Time   CHOL 212 (H) 08/03/2022 1007   CHOL 208 (H) 05/07/2022 0937   TRIG 165 (H) 08/03/2022 1007   HDL 53 08/03/2022 1007   HDL 59 05/07/2022 0937   CHOLHDL 4.0 08/03/2022 1007   VLDL 26 11/08/2015 1026   LDLCALC 131 (H) 08/03/2022 1007   LABVLDL 15 05/07/2022 0937   The 10-year ASCVD risk score (Arnett DK, et al., 2019) is: 3.4%   Values used to calculate the score:     Age: 68 years     Sex: Female     Is Non-Hispanic African American: No     Diabetic: No     Tobacco smoker: No     Systolic Blood Pressure: 131 mmHg     Is BP treated: Yes     HDL Cholesterol: 53 mg/dL     Total Cholesterol: 212 mg/dL   09/81/1914 OV Notes/Cards/Dr. Hilty:  Overall Ms. Theresia Lo is doing well on Zetia. Her lipids are  a bit lower than they had been previously. Other options are limited for her since she cannot take statins and with 0 coronary calcium would not qualify for PCSK9 inhibitors, Nexletol or other therapies. We could consider adding plant Fida sterols to her diet which she is interested in. I would like to see her LDL below 100, but will continue to try to optimize diet and physical activity for this.   2. Vitamin D deficiency  Latest Reference Range & Units 05/07/22 09:37  Vitamin D, 25-Hydroxy 30.0 - 100.0 ng/mL 36.6   She is on weekly Ergocalciferol- denies N/V/Muscle Weakness  3. Insulin resistance  Latest Reference Range & Units 05/07/22 09:37  INSULIN 2.6 - 24.9 uIU/mL 13.1   Lab Results  Component Value Date   HGBA1C 5.3 05/07/2022   HGBA1C 5.1 05/20/2021   HGBA1C 5.2 05/28/2020  She denies polyphagia. She denies GI upset.   Assessment/Plan:   1. Mixed hyperlipidemia Continue Zetia Continue to increase activity and reduce saturated fat. Discuss recent imaging results and treatment options with Cards/Dr. Rennis Golden at next OV.  2. Vitamin D deficiency Refill ergocalciferol (DRISDOL) 1.25 MG (50000 UT) capsule Take 1 capsule (50,000 Units total) by mouth once a week. Dispense:  4 capsule, Refills: 0 ordered   3. Insulin resistance Continue to increase activity and reduce sugar/CHO  4. Obesity, Current BMI 32.88  Rya is currently in the action stage of change. As such, her goal is to continue with weight loss efforts. She has agreed to the Category 3 Plan.   Exercise goals: All adults should avoid inactivity. Some physical activity is better than none, and adults who participate in any amount of physical activity gain some health benefits.  Behavioral modification strategies: increasing lean protein intake, decreasing simple carbohydrates, increasing vegetables, increasing water intake, decreasing eating out, no skipping meals, meal planning and cooking strategies, keeping healthy  foods in the home, better snacking choices, and planning for success.  Fatoumata has agreed to follow-up with our clinic in 4 weeks. She was informed of the importance of frequent follow-up visits to maximize her success with intensive lifestyle modifications for her multiple health conditions.   Objective:   Blood pressure 131/83, pulse 80, temperature 98.8 F (37.1 C), height  (1.702 m), weight 210 lb (95.3 kg), last menstrual period 11/15/2018, SpO2 93 %. Body mass index is 32.89 kg/m.  General: Cooperative, alert, well developed, in no acute distress. HEENT: Conjunctivae and lids unremarkable. Cardiovascular: Regular rhythm.  Lungs: Normal work of breathing. Neurologic: No focal deficits.   Lab Results  Component Value Date   CREATININE 0.55 08/03/2022   BUN 16 08/03/2022   NA 140 08/03/2022   K 4.6 08/03/2022   CL 105 08/03/2022   CO2 26 08/03/2022   Lab Results  Component Value Date   ALT 14 08/03/2022   AST 11 08/03/2022   ALKPHOS 85 07/08/2022   BILITOT 0.5 08/03/2022   Lab Results  Component Value Date   HGBA1C 5.3 05/07/2022   HGBA1C 5.1 05/20/2021   HGBA1C 5.2 05/28/2020   HGBA1C 5.1 11/28/2019   HGBA1C 5.2 09/20/2014   Lab Results  Component Value Date   INSULIN 13.1 05/07/2022   INSULIN 9.2 05/20/2021   INSULIN 6.9 06/18/2020   Lab Results  Component Value Date   TSH 0.68 08/03/2022   Lab Results  Component Value Date   CHOL 212 (H) 08/03/2022   HDL 53 08/03/2022   LDLCALC 131 (H) 08/03/2022   TRIG 165 (H) 08/03/2022   CHOLHDL 4.0 08/03/2022   Lab Results  Component Value Date   VD25OH 36.6 05/07/2022   VD25OH 46.1 05/20/2021   VD25OH 40.1 11/06/2020   Lab Results  Component Value Date   WBC 5.2 08/03/2022   HGB 13.2 08/03/2022   HCT 40.3 08/03/2022   MCV 85.0 08/03/2022   PLT 254 08/03/2022   Lab Results  Component Value Date   IRON 47 01/15/2017   TIBC 448 01/15/2017   FERRITIN 122 06/18/2020    Attestation Statements:    Reviewed by clinician on day of visit: allergies, medications, problem list, medical history, surgical history, family history, social history, and previous encounter notes.  I have reviewed the above documentation for accuracy and completeness, and I agree with the above. -  Aivah Putman d. Bernell Haynie, NP-C
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
## 2022-08-21 ENCOUNTER — Other Ambulatory Visit: Payer: Self-pay | Admitting: Internal Medicine

## 2022-08-21 DIAGNOSIS — E785 Hyperlipidemia, unspecified: Secondary | ICD-10-CM

## 2022-09-09 ENCOUNTER — Encounter (INDEPENDENT_AMBULATORY_CARE_PROVIDER_SITE_OTHER): Payer: Self-pay | Admitting: Adult Health

## 2022-09-09 ENCOUNTER — Ambulatory Visit (INDEPENDENT_AMBULATORY_CARE_PROVIDER_SITE_OTHER): Payer: No Typology Code available for payment source | Admitting: Adult Health

## 2022-09-09 VITALS — BP 114/80 | HR 68 | Temp 98.2°F | Ht 67.0 in | Wt 215.0 lb

## 2022-09-09 DIAGNOSIS — Z6833 Body mass index (BMI) 33.0-33.9, adult: Secondary | ICD-10-CM

## 2022-09-09 DIAGNOSIS — M7918 Myalgia, other site: Secondary | ICD-10-CM

## 2022-09-09 DIAGNOSIS — E669 Obesity, unspecified: Secondary | ICD-10-CM

## 2022-09-09 DIAGNOSIS — E559 Vitamin D deficiency, unspecified: Secondary | ICD-10-CM

## 2022-09-09 DIAGNOSIS — Z6835 Body mass index (BMI) 35.0-35.9, adult: Secondary | ICD-10-CM

## 2022-09-09 MED ORDER — ERGOCALCIFEROL 1.25 MG (50000 UT) PO CAPS: 50000.0000 [IU] | ORAL_CAPSULE | ORAL | 0 refills | Status: DC
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
## 2022-09-10 LAB — LIPID PANEL
Chol/HDL Ratio: 3.7 ratio (ref 0.0–4.4)
Cholesterol, Total: 268 mg/dL — ABNORMAL HIGH (ref 100–199)
HDL: 72 mg/dL (ref >39)
LDL Chol Calc (NIH): 173 mg/dL — ABNORMAL HIGH (ref 0–99)
Triglycerides: 130 mg/dL (ref 0–149)
VLDL Cholesterol Cal: 23 mg/dL (ref 5–40)

## 2022-09-11 ENCOUNTER — Telehealth: Payer: Self-pay | Admitting: *Deleted

## 2022-09-11 ENCOUNTER — Telehealth: Payer: Self-pay | Admitting: Orthopedic Surgery

## 2022-09-11 NOTE — Telephone Encounter (Signed)
Patient left message on triage line requesting return call to discuss [facility] and HRT.

## 2022-09-11 NOTE — Telephone Encounter (Signed)
Patient requesting Gel injection Bil knee last injection 12/2021

## 2022-09-11 NOTE — Telephone Encounter (Signed)
VOB submitted for Durolane, bilateral knee  

## 2022-09-15 ENCOUNTER — Ambulatory Visit: Payer: No Typology Code available for payment source | Attending: Internal Medicine | Admitting: Internal Medicine

## 2022-09-15 ENCOUNTER — Encounter: Payer: Self-pay | Admitting: Internal Medicine

## 2022-09-15 VITALS — BP 104/76 | HR 70 | Ht 67.0 in | Wt 221.0 lb

## 2022-09-15 DIAGNOSIS — E668 Other obesity: Secondary | ICD-10-CM

## 2022-09-15 DIAGNOSIS — M791 Myalgia, unspecified site: Secondary | ICD-10-CM | POA: Diagnosis not present

## 2022-09-15 DIAGNOSIS — E785 Hyperlipidemia, unspecified: Secondary | ICD-10-CM | POA: Diagnosis not present

## 2022-09-15 DIAGNOSIS — T466X5D Adverse effect of antihyperlipidemic and antiarteriosclerotic drugs, subsequent encounter: Secondary | ICD-10-CM

## 2022-09-15 DIAGNOSIS — I700 Atherosclerosis of aorta: Secondary | ICD-10-CM | POA: Diagnosis not present

## 2022-09-15 MED ORDER — PRAVASTATIN SODIUM 40 MG PO TABS: 40.0000 mg | ORAL_TABLET | Freq: Every evening | ORAL | 3 refills | Status: DC

## 2022-09-15 NOTE — Patient Instructions (Addendum)
Medication Instructions:  START pravastatin 40mg  daily  *If you need a refill on your cardiac medications before your next appointment, please call your pharmacy*   Lab Work: FASTING labs to check cholesterol -- as planned w/PCP in October  If you have labs (blood work) drawn today and your tests are completely normal, you will receive your results only by: MyChart Message (if you have MyChart) OR A paper copy in the mail If you have any lab test that is abnormal or we need to change your treatment, we will call you to review the results.   Follow-Up: At [facility], you and your health needs are our priority.  As part of our continuing mission to provide you with exceptional heart care, we have created designated Provider Care Teams.  These Care Teams include your primary Cardiologist (physician) and Advanced Practice Providers (APPs -  Physician Assistants and Nurse Practitioners) who all work together to provide you with the care you need, when you need it.  We recommend signing up for the patient portal called "MyChart".  Sign up information is provided on this After Visit Summary.  MyChart is used to connect with patients for Virtual Visits (Telemedicine).  Patients are able to view lab/test results, encounter notes, upcoming appointments, etc.  Non-urgent messages can be sent to your provider as well.   To learn more about what you can do with MyChart, go to [email or web address].    Your next appointment:     November 2024 with Dr. Rennis Golden -- lipid

## 2022-09-15 NOTE — Telephone Encounter (Signed)
Returned call and pt reports that she was just checking to see if there was any f/u needed from [facility] results.  I advised her of FSH results from 12/2021 per Dr. Sharol Roussel result note and also advised her that if she starts to develop any vasomotor symptoms of menopause that start to affect her daily life then she is welcome to reach out so that we can go over options for treatment. Pt voiced understanding. Will route to provider for final review and close encounter.
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
## 2022-09-17 ENCOUNTER — Other Ambulatory Visit: Payer: Self-pay | Admitting: Internal Medicine

## 2022-09-17 DIAGNOSIS — E785 Hyperlipidemia, unspecified: Secondary | ICD-10-CM

## 2022-09-23 ENCOUNTER — Other Ambulatory Visit: Payer: Self-pay

## 2022-09-23 ENCOUNTER — Telehealth: Payer: Self-pay

## 2022-09-23 NOTE — Telephone Encounter (Signed)
PA has been started online through [employer] fro Durolane, bilateral knee. PA pending key-[id number]

## 2022-09-24 ENCOUNTER — Other Ambulatory Visit: Payer: Self-pay

## 2022-09-24 DIAGNOSIS — M1711 Unilateral primary osteoarthritis, right knee: Secondary | ICD-10-CM

## 2022-09-24 DIAGNOSIS — M1712 Unilateral primary osteoarthritis, left knee: Secondary | ICD-10-CM

## 2022-10-08 ENCOUNTER — Ambulatory Visit (INDEPENDENT_AMBULATORY_CARE_PROVIDER_SITE_OTHER): Payer: No Typology Code available for payment source | Admitting: Adult Health

## 2022-10-08 ENCOUNTER — Ambulatory Visit: Payer: No Typology Code available for payment source | Admitting: Orthopedic Surgery

## 2022-10-08 ENCOUNTER — Encounter (INDEPENDENT_AMBULATORY_CARE_PROVIDER_SITE_OTHER): Payer: Self-pay | Admitting: Adult Health

## 2022-10-08 VITALS — BP 116/82 | HR 66 | Temp 98.3°F | Ht 67.0 in | Wt 215.0 lb

## 2022-10-08 DIAGNOSIS — E782 Mixed hyperlipidemia: Secondary | ICD-10-CM | POA: Diagnosis not present

## 2022-10-08 DIAGNOSIS — E669 Obesity, unspecified: Secondary | ICD-10-CM

## 2022-10-08 DIAGNOSIS — E88819 Insulin resistance, unspecified: Secondary | ICD-10-CM | POA: Diagnosis not present

## 2022-10-08 DIAGNOSIS — E559 Vitamin D deficiency, unspecified: Secondary | ICD-10-CM | POA: Diagnosis not present

## 2022-10-08 DIAGNOSIS — R632 Polyphagia: Secondary | ICD-10-CM

## 2022-10-08 DIAGNOSIS — Z6833 Body mass index (BMI) 33.0-33.9, adult: Secondary | ICD-10-CM

## 2022-10-08 MED ORDER — ERGOCALCIFEROL 1.25 MG (50000 UT) PO CAPS: 50000.0000 [IU] | ORAL_CAPSULE | ORAL | 0 refills | Status: DC
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
## 2022-10-16 ENCOUNTER — Encounter: Payer: Self-pay | Admitting: Orthopedic Surgery

## 2022-10-16 ENCOUNTER — Telehealth: Payer: Self-pay

## 2022-10-16 ENCOUNTER — Ambulatory Visit (INDEPENDENT_AMBULATORY_CARE_PROVIDER_SITE_OTHER): Payer: No Typology Code available for payment source | Admitting: Orthopedic Surgery

## 2022-10-16 DIAGNOSIS — M1711 Unilateral primary osteoarthritis, right knee: Secondary | ICD-10-CM | POA: Diagnosis not present

## 2022-10-16 DIAGNOSIS — M1712 Unilateral primary osteoarthritis, left knee: Secondary | ICD-10-CM

## 2022-10-16 MED ORDER — SODIUM HYALURONATE 60 MG/3ML IX PRSY
60.0000 mg | PREFILLED_SYRINGE | INTRA_ARTICULAR | Status: AC | PRN
Start: 2022-10-16 — End: 2022-10-16
  Administered 2022-10-16: 60 mg via INTRA_ARTICULAR

## 2022-10-16 MED ORDER — SODIUM HYALURONATE 60 MG/3ML IX PRSY
60.0000 mg | PREFILLED_SYRINGE | INTRA_ARTICULAR | Status: AC | PRN
  Administered 2022-10-16: 60 mg via INTRA_ARTICULAR

## 2022-10-16 MED ORDER — LIDOCAINE HCL 1 % IJ SOLN
5.0000 mL | INTRAMUSCULAR | Status: AC | PRN
Start: 2022-10-16 — End: 2022-10-16
  Administered 2022-10-16: 5 mL

## 2022-10-16 NOTE — Progress Notes (Signed)
   Procedure Note  Patient: Tricia Miranda             Date of Birth: June 23, 1965           MRN: [id number]             Visit Date: 10/16/2022  Procedures: Visit Diagnoses:  1. Unilateral primary osteoarthritis, left knee   2. Unilateral primary osteoarthritis, right knee     Large Joint Inj: R knee on 10/16/2022 11:14 AM Indications: diagnostic evaluation, joint swelling and pain Details: 18 G 1.5 in needle, superolateral approach  Arthrogram: No  Medications: 5 mL lidocaine 1 %; 60 mg Sodium Hyaluronate 60 MG/3ML Outcome: tolerated well, no immediate complications Procedure, treatment alternatives, risks and benefits explained, specific risks discussed. Consent was given by the patient. Immediately prior to procedure a time out was called to verify the correct patient, procedure, equipment, support staff and site/side marked as required. Patient was prepped and draped in the usual sterile fashion.    Large Joint Inj: L knee on 10/16/2022 11:14 AM Indications: diagnostic evaluation, joint swelling and pain Details: 18 G 1.5 in needle, superolateral approach  Arthrogram: No  Medications: 5 mL lidocaine 1 %; 60 mg Sodium Hyaluronate 60 MG/3ML Outcome: tolerated well, no immediate complications Procedure, treatment alternatives, risks and benefits explained, specific risks discussed. Consent was given by the patient. Immediately prior to procedure a time out was called to verify the correct patient, procedure, equipment, support staff and site/side marked as required. Patient was prepped and draped in the usual sterile fashion.      Patient also reports some pain with her left ankle.  This is on the lateral side.  She does have good range of motion but describes a sharp pain which sounds like it could be peroneal tendon origin.  Has a history of nondisplaced fibular fracture.  I think radiographs and possible MRI scanning could be indicated based on the symptoms she is having.  She  may make a follow-up appointment to get that evaluated.  Next gel injection may also be supplemented by concurrent PRP.  This patient is diagnosed with osteoarthritis of the knee(s).    Radiographs show evidence of joint space narrowing, osteophytes, subchondral sclerosis and/or subchondral cysts.  This patient has knee pain which interferes with functional and activities of daily living.    This patient has experienced inadequate response, adverse effects and/or intolerance with conservative treatments such as acetaminophen, NSAIDS, topical creams, physical therapy or regular exercise, knee bracing and/or weight loss.   This patient has experienced inadequate response or has a contraindication to intra articular steroid injections for at least 3 months.   This patient is not scheduled to have a total knee replacement within 6 months of starting treatment with viscosupplementation.

## 2022-10-16 NOTE — Telephone Encounter (Signed)
-----   Message from Cammy Copa, MD sent at 10/16/2022 12:07 PM EDT ----- Please preapproved gel injection for 6 months both knees thanks

## 2022-10-16 NOTE — Telephone Encounter (Signed)
See note from Dr Dean 

## 2022-10-19 ENCOUNTER — Ambulatory Visit: Payer: No Typology Code available for payment source | Admitting: Orthopedic Surgery

## 2022-10-21 NOTE — Telephone Encounter (Signed)
Noted. Next available gel injection will need to be after 04/17/2023 for Durolane, bilateral knee. Will submit in November, 2024.

## 2022-11-09 ENCOUNTER — Ambulatory Visit: Payer: No Typology Code available for payment source | Admitting: Orthopedic Surgery

## 2022-11-09 NOTE — Telephone Encounter (Signed)
Routing to covering provider for review.

## 2022-11-09 NOTE — Telephone Encounter (Signed)
Patient has appointment with me 01/19/23.  Encounter reviewed and closed.

## 2022-11-12 ENCOUNTER — Telehealth (INDEPENDENT_AMBULATORY_CARE_PROVIDER_SITE_OTHER): Payer: No Typology Code available for payment source | Admitting: Internal Medicine

## 2022-11-12 ENCOUNTER — Ambulatory Visit (INDEPENDENT_AMBULATORY_CARE_PROVIDER_SITE_OTHER): Payer: No Typology Code available for payment source | Admitting: Adult Health

## 2022-11-12 DIAGNOSIS — U071 COVID-19: Secondary | ICD-10-CM | POA: Diagnosis not present

## 2022-11-12 DIAGNOSIS — R5381 Other malaise: Secondary | ICD-10-CM

## 2022-11-12 MED ORDER — ONDANSETRON HCL 4 MG PO TABS: 4.0000 mg | ORAL_TABLET | Freq: Three times a day (TID) | ORAL | 0 refills | Status: DC | PRN

## 2022-11-12 NOTE — Progress Notes (Addendum)
Patient Care Team: Margaree Mackintosh, MD as PCP - General (Internal Medicine)  I connected with Lillie Columbia Braley on 11/12/22 at 4:29 PM by video enabled telemedicine visit and verified that I am speaking with the correct person using two identifiers.   I discussed the limitations, risks, security and privacy concerns of performing an evaluation and management service by telemedicine and the availability of in-person appointments. I also discussed with the patient that there may be a patient responsible charge related to this service. The patient expressed understanding and agreed to proceed.   Other persons participating in the visit and their role in the encounter: Medical scribe, Carlena Bjornstad  Patient's location: Home  Provider's location: Clinic   I provided 20 minutes of face-to-face video visit time during this encounter, and > 50% was spent counseling as documented under my assessment & plan.   Chief Complaint: COVID-19 infection   Subjective:    Patient ID: Tricia Miranda , Female    DOB: [birth year], 57 y.o.    MRN: [id number]   57 y.o. Female presents today for follow up for COVID-19 infection.   She recently tested positive for a COVID-19 infection and was prescribed Paxlovid. Initially, her symptoms began last Thursday, 7/18 with post-nasal drip and sore throat which she thought was due to her allergies. That evening she developed chills and a deep cough in her chest, as well as significant nausea. No fever that she knows of. Then on Friday, she woke up with a terrible headache which she assumed was a sinus headache. By Saturday her headache and nausea cleared, but still had some coughing.  Yesterday she completed her course of Paxlovid. However, she complains of ongoing intermittent cough with some phlegm production. Yesterday she also had recurring nausea for a few hours, now resolved. Some non-shaking chills yesterday. Earlier today she completed a home COVID-19 test which  was negative. This morning she woke up with a sore throat and a mild headache. Currently she denies any fever. Doesn't believe she has pneumonia since her breathing has been stable without shortness of breath. Occasionally she has been taking tylenol for treatment.   She confirms having 2 prior infections of COVID-19, both of which were milder than her recent infection.   Past Medical History:  Diagnosis Date   Anxiety    Anxiety    Bronchitis    Cancer (HCC)    basal cell- tiny   Cancer (HCC)    Fibroids    uterine    High cholesterol    Hypothyroidism    Joint pain    Low iron    Low vitamin D level    Osteoarthritis    PONV (postoperative nausea and vomiting)    Sleep apnea    uses oral device for mild sleep apnea    Thyroid disease    hypothyroidism   Vitamin B deficiency      Family History  Problem Relation Age of Onset   Hypertension Mother    High Cholesterol Mother    Sleep apnea Mother    Heart disease Father    Alcohol abuse Father    Anxiety disorder Father    Stomach cancer Other    Colon cancer Neg Hx    Esophageal cancer Neg Hx    Pancreatic cancer Neg Hx    Prostate cancer Neg Hx    Rectal cancer Neg Hx     Social History   Social History Narrative   Social history:  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  She is married to an attorney who teaches at [employer] [address].  1 son.  Does not smoke.  Does not get a lot of exercise.       Family history: Father with history of MI and alcoholism.  Mother in good health.  1 sister with history of hip dysplasia.  Maternal grandmother with history of adult onset diabetes.          Review of Systems  Constitutional:  Negative for fever and malaise/fatigue.  HENT:  Positive for sore throat. Negative for congestion.   Eyes:  Negative for blurred vision.  Respiratory:  Positive for cough and sputum production. Negative for shortness of breath.   Cardiovascular:  Negative for  chest pain, palpitations and leg swelling.  Gastrointestinal:  Negative for vomiting.  Musculoskeletal:  Negative for back pain.  Skin:  Negative for rash.  Neurological:  Positive for headaches. Negative for loss of consciousness.        Objective:   Vitals: LMP 11/15/2018    Physical Exam    Results:   Studies obtained and personally reviewed by me:    Labs:       Component Value Date/Time   NA 140 08/03/2022 1007   NA 137 05/07/2022 0937   K 4.6 08/03/2022 1007   CL 105 08/03/2022 1007   CO2 26 08/03/2022 1007   GLUCOSE 93 08/03/2022 1007   BUN 16 08/03/2022 1007   BUN 17 05/07/2022 0937   CREATININE 0.55 08/03/2022 1007   CALCIUM 9.2 08/03/2022 1007   PROT 6.7 08/03/2022 1007   PROT 6.7 05/07/2022 0937   ALBUMIN 3.9 07/08/2022 1009   ALBUMIN 4.3 05/07/2022 0937   AST 11 08/03/2022 1007   ALT 14 08/03/2022 1007   ALKPHOS 85 07/08/2022 1009   BILITOT 0.5 08/03/2022 1007   BILITOT 0.3 05/07/2022 0937   GFRNONAA >60 07/08/2022 1009   GFRNONAA 108 05/28/2020 1145   GFRAA 126 05/28/2020 1145     Lab Results  Component Value Date   WBC 5.2 08/03/2022   HGB 13.2 08/03/2022   HCT 40.3 08/03/2022   MCV 85.0 08/03/2022   PLT 254 08/03/2022    Lab Results  Component Value Date   CHOL 268 (H) 09/09/2022   HDL 72 09/09/2022   LDLCALC 173 (H) 09/09/2022   TRIG 130 09/09/2022   CHOLHDL 3.7 09/09/2022    Lab Results  Component Value Date   HGBA1C 5.3 05/07/2022     Lab Results  Component Value Date   TSH 0.68 08/03/2022       Assessment & Plan:   Recent COVID-19 viral infection - She completed her course of Paxlovid yesterday. Advised her to take tylenol as needed for her symptoms. She had [facility] [place], and has used 2 of the doses. We will  send in  Zofran 4 mg tablet prescription (# 20 tablets) to take as needed for nausea.   I,Mathew Stumpf,acting as a Neurosurgeon for Margaree Mackintosh, MD.,have  documented all relevant documentation on the behalf of Margaree Mackintosh, MD,as directed by  Margaree Mackintosh, MD while in the presence of Margaree Mackintosh, MD.   I, Margaree Mackintosh, MD, have reviewed all documentation for this visit. The documentation on 11/13/22 for the exam, diagnosis, procedures, and orders are all accurate and complete.

## 2022-11-13 ENCOUNTER — Encounter: Payer: Self-pay | Admitting: Internal Medicine

## 2022-11-13 NOTE — Patient Instructions (Addendum)
You have completed course of Paxlovid for COVID-19.  You may continue to experience fatigue and malaise for a couple of weeks.  Zofran tablets refilled for nausea.  May take Tylenol for myalgias and headache.  Stay well-hydrated.  Walk some to prevent atelectasis.

## 2022-11-16 ENCOUNTER — Ambulatory Visit (INDEPENDENT_AMBULATORY_CARE_PROVIDER_SITE_OTHER): Payer: No Typology Code available for payment source | Admitting: Family Medicine

## 2022-11-16 ENCOUNTER — Encounter (INDEPENDENT_AMBULATORY_CARE_PROVIDER_SITE_OTHER): Payer: Self-pay | Admitting: Family Medicine

## 2022-11-16 VITALS — BP 102/70 | HR 77 | Temp 98.1°F | Ht 67.0 in | Wt 215.0 lb

## 2022-11-16 DIAGNOSIS — E782 Mixed hyperlipidemia: Secondary | ICD-10-CM | POA: Diagnosis not present

## 2022-11-16 DIAGNOSIS — E88819 Insulin resistance, unspecified: Secondary | ICD-10-CM | POA: Diagnosis not present

## 2022-11-16 DIAGNOSIS — E559 Vitamin D deficiency, unspecified: Secondary | ICD-10-CM | POA: Diagnosis not present

## 2022-11-16 DIAGNOSIS — R0602 Shortness of breath: Secondary | ICD-10-CM

## 2022-11-16 DIAGNOSIS — E538 Deficiency of other specified B group vitamins: Secondary | ICD-10-CM

## 2022-11-16 DIAGNOSIS — Z6833 Body mass index (BMI) 33.0-33.9, adult: Secondary | ICD-10-CM

## 2022-11-16 DIAGNOSIS — E039 Hypothyroidism, unspecified: Secondary | ICD-10-CM

## 2022-11-16 DIAGNOSIS — E669 Obesity, unspecified: Secondary | ICD-10-CM

## 2022-11-16 MED ORDER — ERGOCALCIFEROL 1.25 MG (50000 UT) PO CAPS: 50000.0000 [IU] | ORAL_CAPSULE | ORAL | 0 refills | Status: DC

## 2022-11-16 NOTE — Progress Notes (Unsigned)
Carlye Grippe, D.O.  ABFM, ABOM Specializing in Clinical Bariatric Medicine  Office located at: [address] [place], [place]  [address]     Assessment and Plan:  Recheck labs next OV.  Orders Placed This Encounter  Procedures   Comprehensive metabolic panel   Hemoglobin A1c   Insulin, random   Lipid panel   T4, free   TSH   T3   VITAMIN D 25 Hydroxy (Vit-D Deficiency, Fractures)   Vitamin B12   Folate   Medications Discontinued During This Encounter  Medication Reason   ergocalciferol (DRISDOL) 1.25 MG (50000 UT) capsule Reorder    Meds ordered this encounter  Medications   ergocalciferol (DRISDOL) 1.25 MG (50000 UT) capsule    Sig: Take 1 capsule (50,000 Units total) by mouth once a week.    Dispense:  4 capsule    Refill:  0    Vitamin D deficiency Assessment: Condition is Not at goal.. Her vitamin D level dropped from 46.1 to 36.6 within a year. She continues Ergocalciferol weekly without any significant difficulties.  Lab Results  Component Value Date   VD25OH 36.6 05/07/2022   VD25OH 46.1 05/20/2021   VD25OH 40.1 11/06/2020   Plan: - Continue Ergocalciferol 50K IU weekly. Will refill today.   - weight loss will likely improve availability of vitamin D, thus encouraged Tricia Miranda to continue with meal plan and their weight loss efforts to further improve this condition.  Thus, we will need to monitor levels regularly (every 3-4 mo on average) to keep levels within normal limits and prevent over supplementation.   SOB (shortness of breath) on exertion Assessment: Her VO2  is 247, REE is1699, calculated BMR is 1715.   Plan: - I reviewed these findings with the pt and what her implications are for the meal plan.   - We also discussed various ways we can improved her REE as well.   - I advised that she lift weights separately as walking with weights may cause a shoulder injury. I advised that she add a additional 60 minutes or weight training twice a  week.   Mixed hyperlipidemia Assessment: Condition is Not at goal..She has since been placed on Pravastatin by Dr. Rennis Golden her cardiologist based on various tests.  Lab Results  Component Value Date   CHOL 268 (H) 09/09/2022   HDL 72 09/09/2022   LDLCALC 173 (H) 09/09/2022   TRIG 130 09/09/2022   CHOLHDL 3.7 09/09/2022   Plan: - I reviewed her cholesterol panel from 09/09/2022 and coronary calcium score with the pt.   - Continue Pravastatin 40mg  daily as directed by Dr. Rennis Golden.   - I stressed the importance that patient continue with our prudent nutritional plan that is low in saturated and trans fats, and low in fatty carbs to improve these numbers.   - We recommend: aerobic activity with eventual goal of a minimum of 150+ min wk plus 2 days/ week of resistance or strength training.    - We will continue routine screening as patient continues to achieve health goals along their weight loss journey    Insulin resistance Assessment: Condition is Improving, but not optimized. Her A1c levels are optimal however her insulin levels are elevated. She is not on any antidiabetic medications at this time.  Lab Results  Component Value Date   HGBA1C 5.3 05/07/2022   HGBA1C 5.1 05/20/2021   HGBA1C 5.2 05/28/2020   INSULIN 13.1 05/07/2022   INSULIN 9.2 05/20/2021   INSULIN  6.9 06/18/2020    Plan: -  Continue to follow the prescribed meal plan as directed. Will adjust treatment as deemed clinically necessary.   - Continue to decrease simple carbs/ sugars; increase fiber and proteins -> follow her meal plan. Explained role of simple carbs and insulin levels on hunger and cravings  - Anticipatory guidance given.    - We will recheck A1c and fasting insulin level in approximately 3 months from last check, or as deemed appropriate.    Vitamin B 12 deficiency Assessment: Condition is Controlled.. This is controlled with OTC B12 supplements daily nd she tolerates this well with no adverse side  effects.  Lab Results  Component Value Date   VITAMINB12 1,024 05/07/2022   Plan: - Continue with OTC B12 1,000 mcg daily.   - Continue to eat b12 rich foods such as lean red meats, poultry, eggs, seafood, beans, peas, and lentils, nuts and seeds; and soy products    TREATMENT PLAN FOR OBESITY: Obesity, Current BMI 33.8 Assessment:  Tricia Miranda is here to discuss her progress with her obesity treatment plan along with follow-up of her obesity related diagnoses. See Medical Weight Management Flowsheet for complete bioelectrical impedance results.  Condition is not optimized. Biometric data collected today, was reviewed with patient.   Since last office visit on 10/08/2022 patient's  Muscle mass has decreased by 2.6lb. Fat mass has increased by 2.8lb. Total body water has decreased by 0.6lb.  Counseling done on how various foods will affect these numbers and how to maximize success  Total lbs lost to date: 10lbs Total weight loss percentage to date: 4.44%   Plan: Begin the Category 2 meal plan with B and L otpions and 100 snack calories.    Behavioral Intervention Additional resources provided today: category 2 meal plan information, breakfast options, and lunch options Evidence-based interventions for health behavior change were utilized today including the discussion of self monitoring techniques, problem-solving barriers and SMART goal setting techniques.   Regarding patient's less desirable eating habits and patterns, we employed the technique of small changes.  Pt will specifically work on: Start category 2 meal plan for next visit.    Recommended Physical Activity Goals  Tricia Miranda has been advised to slowly work up to 150 minutes of moderate intensity aerobic activity a week and strengthening exercises 2-3 times per week for cardiovascular health, weight loss maintenance and preservation of muscle mass.   She has agreed to Continue current level of physical activity    FOLLOW UP: No follow-ups on file. She was informed of the importance of frequent follow up visits to maximize her success with intensive lifestyle modifications for her multiple health conditions. Tricia Miranda is aware that we will review all of her lab results at our next visit together in person.  She is aware that if anything is critical/ life threatening with the results, we will be contacting her via MyChart or by my CMA will be calling them prior to the office visit to discuss acute management.     Subjective:   Chief complaint: Obesity Tricia Miranda is here to discuss her progress with her obesity treatment plan. She is on the the Category 3 Plan and states she is following her eating plan approximately 50% of the time. She states she is exercising.   Interval History:  Tricia Miranda is here for a follow up office visit.     Since last OV she had Covid around 11/06/2022 and her tests are now negative.  She notes that she is still recovering fatigue wise. For a week she was mainly bed bound and relied on her 8 y.o son to cook meals for her that mainly consisted of pasta. She notes falling off her meal plan due to this. She informed me that she would hope to get back into a rowing class.   Review of Systems:  Pertinent positives were addressed with patient today.   Reviewed by clinician on day of visit: allergies, medications, problem list, medical history, surgical history, family history, social history, and previous encounter notes.  Weight Summary and Biometrics   Weight Lost Since Last Visit: 0lb  Weight Gained Since Last Visit: 0lb  Vitals Temp: 98.1 F (36.7 C) BP: 102/70 Pulse Rate: 77 SpO2: 96 %   Anthropometric Measurements Height: 5\' 7"  (1.702 m) Weight: 215 lb (97.5 kg) BMI (Calculated): 33.67 Weight at Last Visit: 215lb Weight Lost Since Last Visit: 0lb Weight Gained Since Last Visit: 0lb Starting Weight: 224lb Total Weight Loss (lbs): 9 lb (4.082  kg)   Body Composition  Body Fat %: 42.4 % Fat Mass (lbs): 91.2 lbs Muscle Mass (lbs): 117.8 lbs Total Body Water (lbs): 78.2 lbs Visceral Fat Rating : 11   Other Clinical Data Fasting: no Labs: no Today's Visit #: 33 Starting Date: 06/18/20     Objective:   PHYSICAL EXAM: Blood pressure 102/70, pulse 77, temperature 98.1 F (36.7 C), height 5\' 7"  (1.702 m), weight 215 lb (97.5 kg), last menstrual period 11/15/2018, SpO2 96%. Body mass index is 33.67 kg/m.  General: Well Developed, well nourished, and in no acute distress.  HEENT: Normocephalic, atraumatic Skin: Warm and dry, cap RF less 2 sec, good turgor Chest:  Normal excursion, shape, no gross abn Respiratory: speaking in full sentences, no conversational dyspnea NeuroM-Sk: Ambulates w/o assistance, moves * 4 Psych: A and O *3, insight good, mood-full  DIAGNOSTIC DATA REVIEWED:  BMET    Component Value Date/Time   NA 140 08/03/2022 1007   NA 137 05/07/2022 0937   K 4.6 08/03/2022 1007   CL 105 08/03/2022 1007   CO2 26 08/03/2022 1007   GLUCOSE 93 08/03/2022 1007   BUN 16 08/03/2022 1007   BUN 17 05/07/2022 0937   CREATININE 0.55 08/03/2022 1007   CALCIUM 9.2 08/03/2022 1007   GFRNONAA >60 07/08/2022 1009   GFRNONAA 108 05/28/2020 1145   GFRAA 126 05/28/2020 1145   Lab Results  Component Value Date   HGBA1C 5.3 05/07/2022   HGBA1C 5.3 10/07/2010   Lab Results  Component Value Date   INSULIN 13.1 05/07/2022   INSULIN 6.9 06/18/2020   Lab Results  Component Value Date   TSH 0.68 08/03/2022   CBC    Component Value Date/Time   WBC 5.2 08/03/2022 1007   RBC 4.74 08/03/2022 1007   HGB 13.2 08/03/2022 1007   HCT 40.3 08/03/2022 1007   PLT 254 08/03/2022 1007   MCV 85.0 08/03/2022 1007   MCH 27.8 08/03/2022 1007   MCHC 32.8 08/03/2022 1007   RDW 12.4 08/03/2022 1007   Iron Studies    Component Value Date/Time   IRON 47 01/15/2017 0906   TIBC 448 01/15/2017 0906   FERRITIN 122  06/18/2020 1059   IRONPCTSAT 10 (L) 01/15/2017 0906   Lipid Panel     Component Value Date/Time   CHOL 268 (H) 09/09/2022 1101   TRIG 130 09/09/2022 1101   HDL 72 09/09/2022 1101   CHOLHDL 3.7 09/09/2022 1101   CHOLHDL 4.0  08/03/2022 1007   VLDL 26 11/08/2015 1026   LDLCALC 173 (H) 09/09/2022 1101   LDLCALC 131 (H) 08/03/2022 1007   Hepatic Function Panel     Component Value Date/Time   PROT 6.7 08/03/2022 1007   PROT 6.7 05/07/2022 0937   ALBUMIN 3.9 07/08/2022 1009   ALBUMIN 4.3 05/07/2022 0937   AST 11 08/03/2022 1007   ALT 14 08/03/2022 1007   ALKPHOS 85 07/08/2022 1009   BILITOT 0.5 08/03/2022 1007   BILITOT 0.3 05/07/2022 0937   BILIDIR 0.1 11/28/2019 1217   IBILI 0.5 11/28/2019 1217      Component Value Date/Time   TSH 0.68 08/03/2022 1007   Nutritional Lab Results  Component Value Date   VD25OH 36.6 05/07/2022   VD25OH 46.1 05/20/2021   VD25OH 40.1 11/06/2020    Attestations:   This encounter took 40 total minutes of time including any pre-visit and post-visit time spent on this date of service, including taking a thorough history, reviewing any labs and/or imaging, reviewing prior notes, as well as documenting in the electronic health record on the date of service. Over 50% of that time was in direct face-to-face counseling and coordinating care for the patient today  I, Clinical biochemist, acting as a Stage manager for [employer], DO., have compiled all relevant documentation for today's office visit on behalf of Thomasene Lot, DO, while in the presence of [employer], DO.  I have reviewed the above documentation for accuracy and completeness, and I agree with the above. Carlye Grippe, D.O.  The 21st Century Cures Act was signed into law in 2016 which includes the topic of electronic health records.  This provides immediate access to information in MyChart.  This includes consultation notes, operative notes, office notes, lab results and  pathology reports.  If you have any questions about what you read please let us know at your next visit so we can discuss your concerns and take corrective action if need be.  We are right here with you.

## 2022-11-17 LAB — T3: T3, Total: 86 ng/dL (ref 71–180)

## 2022-11-17 LAB — FOLATE: Folate: 8.7 ng/mL

## 2022-11-17 LAB — TSH: TSH: 1.14 u[IU]/mL (ref 0.450–4.500)

## 2022-11-20 ENCOUNTER — Other Ambulatory Visit (INDEPENDENT_AMBULATORY_CARE_PROVIDER_SITE_OTHER): Payer: Self-pay

## 2022-11-20 ENCOUNTER — Encounter: Payer: Self-pay | Admitting: Orthopedic Surgery

## 2022-11-20 ENCOUNTER — Ambulatory Visit (INDEPENDENT_AMBULATORY_CARE_PROVIDER_SITE_OTHER): Payer: No Typology Code available for payment source | Admitting: Orthopedic Surgery

## 2022-11-20 ENCOUNTER — Other Ambulatory Visit (INDEPENDENT_AMBULATORY_CARE_PROVIDER_SITE_OTHER): Payer: No Typology Code available for payment source

## 2022-11-20 DIAGNOSIS — M25512 Pain in left shoulder: Secondary | ICD-10-CM

## 2022-11-20 DIAGNOSIS — M25572 Pain in left ankle and joints of left foot: Secondary | ICD-10-CM
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
## 2022-12-01 ENCOUNTER — Other Ambulatory Visit: Payer: Self-pay

## 2022-12-01 MED ORDER — SYNTHROID 100 MCG PO TABS: 100.0000 ug | ORAL_TABLET | Freq: Every day | ORAL | 1 refills | Status: DC

## 2022-12-16 ENCOUNTER — Encounter (INDEPENDENT_AMBULATORY_CARE_PROVIDER_SITE_OTHER): Payer: Self-pay | Admitting: Adult Health

## 2022-12-16 ENCOUNTER — Ambulatory Visit (INDEPENDENT_AMBULATORY_CARE_PROVIDER_SITE_OTHER): Payer: No Typology Code available for payment source | Admitting: Adult Health

## 2022-12-16 ENCOUNTER — Other Ambulatory Visit: Payer: Self-pay | Admitting: Obstetrics and Gynecology

## 2022-12-16 VITALS — BP 120/75 | HR 71 | Temp 98.3°F | Ht 67.0 in | Wt 213.0 lb

## 2022-12-16 DIAGNOSIS — E559 Vitamin D deficiency, unspecified: Secondary | ICD-10-CM

## 2022-12-16 DIAGNOSIS — Z6833 Body mass index (BMI) 33.0-33.9, adult: Secondary | ICD-10-CM

## 2022-12-16 DIAGNOSIS — Z1231 Encounter for screening mammogram for malignant neoplasm of breast: Secondary | ICD-10-CM

## 2022-12-16 DIAGNOSIS — E538 Deficiency of other specified B group vitamins: Secondary | ICD-10-CM

## 2022-12-16 DIAGNOSIS — E88819 Insulin resistance, unspecified: Secondary | ICD-10-CM

## 2022-12-16 DIAGNOSIS — E039 Hypothyroidism, unspecified: Secondary | ICD-10-CM

## 2022-12-16 DIAGNOSIS — E782 Mixed hyperlipidemia: Secondary | ICD-10-CM | POA: Diagnosis not present

## 2022-12-16 DIAGNOSIS — E669 Obesity, unspecified: Secondary | ICD-10-CM

## 2022-12-16 DIAGNOSIS — R0602 Shortness of breath: Secondary | ICD-10-CM | POA: Diagnosis not present

## 2022-12-16 MED ORDER — ERGOCALCIFEROL 1.25 MG (50000 UT) PO CAPS
50000.0000 [IU] | ORAL_CAPSULE | ORAL | 0 refills | Status: AC
Start: 2022-12-16 — End: ?

## 2022-12-16 NOTE — Progress Notes (Signed)
WEIGHT SUMMARY AND BIOMETRICS  Vitals Temp: 98.3 F (36.8 C) BP: 120/75 Pulse Rate: 71 SpO2: 97 %   Anthropometric Measurements Height: 5\' 7"  (1.702 m) Weight: 213 lb (96.6 kg) BMI (Calculated): 33.35 Weight at Last Visit: 215lb Weight Lost Since Last Visit: 2lb Weight Gained Since Last Visit: 0 Starting Weight: 224lb Total Weight Loss (lbs): 11 lb (4.99 kg)   Body Composition  Body Fat %: 41.4 % Fat Mass (lbs): 88.4 lbs Muscle Mass (lbs): 118.6 lbs Total Body Water (lbs): 78.8 lbs Visceral Fat Rating : 11   Other Clinical Data Fasting: yes Labs: no Today's Visit #: 34 Starting Date: 06/18/20    Chief Complaint:   OBESITY Tricia Miranda is here to discuss her progress with her obesity treatment plan. She is on the the Category 2 Plan and states she is following her eating plan approximately 90 % of the time. She states she is exercising Rowing and Pilates 50+ minutes 4 times per week.   Interim History:  At last OV at [facility] IC checked, RMR was 1715 Her prescribed meal was adjusted from Cat 3 to Cat 2, to account for reduced metabolic rate  She has been following a hybrid of the two plans, due to increased hunger  Reviewed Bioimpedance results with pt: Muscle Mass: +0.8 lb Adipose Mass: -2.8 lbs  Of Note- + COVID-19 around 11/06/2022  Completed course of Paxlovid Experienced rebound COVID-19 infection During her acute illness, she was much less active and consumed more foods high in simple CHO content  Subjective:   1. Mixed hyperlipidemia Discussed Labs Cards/Dr. Rennis Golden restarted her on Pravastatin 40mg  every day She has been consistently on statin therapy since early June 2024 Lipid Panel     Component Value Date/Time   CHOL 215 (H) 11/16/2022 1148   TRIG 178 (H) 11/16/2022 1148   HDL 50 11/16/2022 1148   CHOLHDL 4.3 11/16/2022 1148   CHOLHDL 4.0 08/03/2022 1007   VLDL 26 11/08/2015 1026   LDLCALC 133 (H) 11/16/2022 1148   LDLCALC 131 (H)  08/03/2022 1007   LABVLDL 32 11/16/2022 1148   The 10-year ASCVD risk score (Arnett DK, et al., 2019) is: 3%   2. Hypothyroidism, unspecified type Discussed Labs  Latest Reference Range & Units 11/16/22 11:48  TSH 0.450 - 4.500 uIU/mL 1.140  Triiodothyronine (T3) 71 - 180 ng/dL 86  Z6,XWRU(EAVWUJ) 8.11 - 1.77 ng/dL 9.14    All levels within range PCP manages daily Levothyroxine  3. Insulin resistance Discussed Labs  Latest Reference Range & Units 11/16/22 11:48  Glucose 70 - 99 mg/dL 86  Hemoglobin N8G 4.8 - 5.6 % 5.2  Est. average glucose Bld gHb Est-mCnc mg/dL 956  INSULIN 2.6 - 21.3 uIU/mL 16.4   Insulin level worsened, A1c improved She is not any antidiabetic medications  4. SOB (shortness of breath) on exertion Discussed Labs At last OV at [facility] IC checked, RMR was 1715 Her prescribed meal was adjusted from Cat 3 to Cat 2, to account for reduced metabolic rate  5. Vitamin D deficiency Discussed Labs  Latest Reference Range & Units 05/07/22 09:37 11/16/22 11:48  Vitamin D, 25-Hydroxy 30.0 - 100.0 ng/mL 36.6 43.9   She is on weekly Ergocalciferol- denies N/V/Muscle Weakness  6. Vitamin B 12 deficiency Discussed Labs  Latest Reference Range & Units 11/16/22 11:48  Vitamin B12 232 - 1,245 pg/mL 1,179   She is on oral Cyanocobalmin once daily   Assessment/Plan:   1. Mixed hyperlipidemia  Continue healthy eating, regular exercise, and daily statin therapy  2. Hypothyroidism, unspecified type Continue daily Levothyroxine  3. Insulin resistance Continue healthy eating and regular exercise   4. SOB (shortness of breath) on exertion Continue Cat 2 meal plan- several handouts provided  5. Vitamin D deficiency Refill ergocalciferol (DRISDOL) 1.25 MG (50000 UT) capsule Take 1 capsule (50,000 Units total) by mouth once a week. Dispense: 4 capsule, Refills: 0 ordered   6. Vitamin B 12 deficiency Continue  oral Cyanocobalmin once  daily  7. Obesity, Current BMI 33.35  Tricia Miranda is currently in the action stage of change. As such, her goal is to continue with weight loss efforts. She has agreed to the Category 2 Plan.   Exercise goals: For substantial health benefits, adults should do at least 150 minutes (2 hours and 30 minutes) a week of moderate-intensity, or 75 minutes (1 hour and 15 minutes) a week of vigorous-intensity aerobic physical activity, or an equivalent combination of moderate- and vigorous-intensity aerobic activity. Aerobic activity should be performed in episodes of at least 10 minutes, and preferably, it should be spread throughout the week.  Behavioral modification strategies: increasing lean protein intake, decreasing simple carbohydrates, increasing vegetables, increasing water intake, no skipping meals, keeping healthy foods in the home, and planning for success.  Tricia Miranda has agreed to follow-up with our clinic in 4 weeks. She was informed of the importance of frequent follow-up visits to maximize her success with intensive lifestyle modifications for her multiple health conditions.   Objective:   Blood pressure 120/75, pulse 71, temperature 98.3 F (36.8 C), height 5\' 7"  (1.702 m), weight 213 lb (96.6 kg), last menstrual period 11/15/2018, SpO2 97%. Body mass index is 33.36 kg/m.  General: Cooperative, alert, well developed, in no acute distress. HEENT: Conjunctivae and lids unremarkable. Cardiovascular: Regular rhythm.  Lungs: Normal work of breathing. Neurologic: No focal deficits.   Lab Results  Component Value Date   CREATININE 0.57 11/16/2022   BUN 20 11/16/2022   NA 142 11/16/2022   K 4.5 11/16/2022   CL 103 11/16/2022   CO2 24 11/16/2022   Lab Results  Component Value Date   ALT 29 11/16/2022   AST 16 11/16/2022   ALKPHOS 98 11/16/2022   BILITOT 0.3 11/16/2022   Lab Results  Component Value Date   HGBA1C 5.2 11/16/2022   HGBA1C 5.3 05/07/2022   HGBA1C 5.1 05/20/2021    HGBA1C 5.2 05/28/2020   HGBA1C 5.1 11/28/2019   Lab Results  Component Value Date   INSULIN 16.4 11/16/2022   INSULIN 13.1 05/07/2022   INSULIN 9.2 05/20/2021   INSULIN 6.9 06/18/2020   Lab Results  Component Value Date   TSH 1.140 11/16/2022   Lab Results  Component Value Date   CHOL 215 (H) 11/16/2022   HDL 50 11/16/2022   LDLCALC 133 (H) 11/16/2022   TRIG 178 (H) 11/16/2022   CHOLHDL 4.3 11/16/2022   Lab Results  Component Value Date   VD25OH 43.9 11/16/2022   VD25OH 36.6 05/07/2022   VD25OH 46.1 05/20/2021   Lab Results  Component Value Date   WBC 5.2 08/03/2022   HGB 13.2 08/03/2022   HCT 40.3 08/03/2022   MCV 85.0 08/03/2022   PLT 254 08/03/2022   Lab Results  Component Value Date   IRON 47 01/15/2017   TIBC 448 01/15/2017   FERRITIN 122 06/18/2020    Attestation Statements:   Reviewed by clinician on day of visit: allergies, medications, problem list, medical history,  surgical history, family history, social history, and previous encounter notes.  I have reviewed the above documentation for accuracy and completeness, and I agree with the above. -  Dilia Alemany d. Kaleel Schmieder, NP-C

## 2022-12-23 ENCOUNTER — Ambulatory Visit
Admission: RE | Admit: 2022-12-23 | Discharge: 2022-12-23 | Disposition: A | Discharge status: Home / Self Care | Payer: No Typology Code available for payment source | Source: Ambulatory Visit | Attending: Obstetrics and Gynecology | Admitting: Obstetrics and Gynecology

## 2022-12-23 DIAGNOSIS — Z1231 Encounter for screening mammogram for malignant neoplasm of breast: Secondary | ICD-10-CM

## 2023-01-05 NOTE — Progress Notes (Signed)
57 y.o. G38P1001 Divorced Caucasian female here for annual exam.  Wants to discuss HRT and pelvic floor therapy.  Some hot flashes and cold sweats  Affecting her sleep.  Intermittent joint pain.  Has had rheumatologic evaluation.  FSH 23.4 01/14/22.  Has done physical therapy for back pain at [facility] through Dr. Lenord Fellers.  She was told she may need pelvic floor therapy by [facility].  Patient has some urge incontinence.  Some leakage of urine with cough, sneeze.   PCP:  Dr. Lenord Fellers   Patient's last menstrual period was 11/15/2018.           Sexually active: No.  The current method of family planning is status post hysterectomy.    Exercising: Yes.     Rowing club, pilates Smoker:  no  Health Maintenance: Pap:  11/2017 History of abnormal Pap:  no MMG:  12/23/22 Breast density Cat C, BI-RADS CAT 1 neg Colonoscopy:  03/22/17 BMD:   n/a  Result  n/a TDaP:  05/02/18 Gardasil:   no HIV: 07/08/22 NR Hep C: n/a Screening Labs:  PCP   reports that she has never smoked. She has never used smokeless tobacco. She reports current alcohol use. She reports that she does not use drugs.  Past Medical History:  Diagnosis Date   Anxiety    Anxiety    Bronchitis    Cancer (HCC)    basal cell- tiny   Cancer (HCC)    Fibroids    uterine    High cholesterol    Hypothyroidism    Joint pain    Low iron    Low vitamin D level    Osteoarthritis    PONV (postoperative nausea and vomiting)    Sleep apnea    uses oral device for mild sleep apnea    Thyroid disease    hypothyroidism   Vitamin B deficiency     Past Surgical History:  Procedure Laterality Date   ABDOMINAL HYSTERECTOMY     APPENDECTOMY  06/2022   COLONOSCOPY     with benign  polyp removal    DILITATION & CURRETTAGE/HYSTROSCOPY WITH VERSAPOINT RESECTION N/A 03/16/2014   Procedure: DILATATION & CURETTAGE/HYSTEROSCOPY WITH VERSAPOINT RESECTION;  Surgeon: Genia Del, MD;  Location: [facility];  Service:  Gynecology;  Laterality: N/A;  1 hr.   HYSTERECTOMY ABDOMINAL WITH SALPINGECTOMY Bilateral 11/15/2018   Procedure: HYSTERECTOMY ABDOMINAL WITH  BILATERAL SALPINGECTOMY;  Surgeon: Genia Del, MD;  Location: [facility] [place];  Service: Gynecology;  Laterality: Bilateral;   LAPAROSCOPIC APPENDECTOMY N/A 07/08/2022   Procedure: APPENDECTOMY LAPAROSCOPIC;  Surgeon: Emelia Loron, MD;  Location: [facility] OR;  Service: General;  Laterality: N/A;   MOHS SURGERY Left a while- unknown   under left eyelid   WISDOM TOOTH EXTRACTION      Current Outpatient Medications  Medication Sig Dispense Refill   ALPRAZolam (XANAX) 0.25 MG tablet TAKE ONE TABLET BY MOUTH TWICE A DAY AS NEEDED 60 tablet 3   cyanocobalamin 1000 MCG tablet Take 1,000 mcg by mouth daily.     ergocalciferol (DRISDOL) 1.25 MG (50000 UT) capsule Take 1 capsule (50,000 Units total) by mouth once a week. 4 capsule 0   ezetimibe (ZETIA) 10 MG tablet Take 1 tablet (10 mg total) by mouth daily. 90 tablet 3   FLUoxetine (PROZAC) 20 MG capsule Take 20 mg by mouth daily.     meloxicam (MOBIC) 15 MG tablet TAKE ONE TABLET BY MOUTH DAILY 30 tablet 3   methylcellulose (CITRUCEL) oral powder  Take 1 packet by mouth daily. (Patient taking differently: Take 1 packet by mouth daily as needed (Fiber).)     ondansetron (ZOFRAN) 4 MG tablet Take 1 tablet (4 mg total) by mouth every 8 (eight) hours as needed for nausea or vomiting. 20 tablet 0   propranolol (INDERAL) 10 MG tablet Take 10 mg by mouth as needed (Anxiety).     SYNTHROID 100 MCG tablet Take 1 tablet (100 mcg total) by mouth daily before breakfast. TAKE ONE TABLET BY MOUTH DAILY BEFORE A MEAL 90 tablet 1   pravastatin (PRAVACHOL) 40 MG tablet Take 1 tablet (40 mg total) by mouth every evening. 90 tablet 3   No current facility-administered medications for this visit.    Family History  Problem Relation Age of Onset   Hypertension Mother    High Cholesterol Mother    Sleep  apnea Mother    Heart disease Father    Alcohol abuse Father    Anxiety disorder Father    Stomach cancer Other    Colon cancer Neg Hx    Esophageal cancer Neg Hx    Pancreatic cancer Neg Hx    Prostate cancer Neg Hx    Rectal cancer Neg Hx     Review of Systems  All other systems reviewed and are negative.   Exam:   BP 128/84 (BP Location: Right Arm, Patient Position: Sitting, Cuff Size: Normal)   Pulse 76   Ht 5' 6.5" (1.689 m)   Wt 216 lb (98 kg)   LMP 11/15/2018   SpO2 98%   BMI 34.34 kg/m     General appearance: alert, cooperative and appears stated age Head: normocephalic, without obvious abnormality, atraumatic Neck: no adenopathy, supple, symmetrical, trachea midline and thyroid normal to inspection and palpation Lungs: clear to auscultation bilaterally Breasts: normal appearance, no masses or tenderness, No nipple retraction or dimpling, No nipple discharge or bleeding, No axillary adenopathy Heart: regular rate and rhythm Abdomen: soft, non-tender; no masses, no organomegaly Extremities: extremities normal, atraumatic, no cyanosis or edema Skin: skin color, texture, turgor normal. No rashes or lesions Lymph nodes: cervical, supraclavicular, and axillary nodes normal. Neurologic: grossly normal  Pelvic: External genitalia:  no lesions              No abnormal inguinal nodes palpated.              Urethra:  normal appearing urethra with no masses, tenderness or lesions              Bartholins and Skenes: normal                 Vagina: normal appearing vagina with normal color and discharge, no lesions              Cervix: absent              Pap taken: no Bimanual Exam:  Uterus:  normal size, contour, position, consistency, mobility, non-tender              Adnexa: no mass, fullness, tenderness              Rectal exam: yes.  Confirms.              Anus:  normal sphincter tone, no lesions  Chaperone was present for exam:  Warren Lacy, CMA  Assessment:   Well  woman visit with gynecologic exam. Status post TAH/bilateral salpingectomy 11/15/2018.  Fibroids.  Menopausal symptoms.  Hx vit D deficiency.  Mixed incontinence.   Plan: Mammogram screening discussed. Self breast awareness reviewed. Pap and HR HPV as above. Guidelines for Calcium, Vitamin D, regular exercise program including cardiovascular and weight bearing exercise. FSH, estradiol.  Rx for Vivelle Dot 0.0375 mg twice weekly.  I discussed risks of stroke, PE, and DVT.  Referral for pelvic floor therapy.  Fu in 3 months.  Follow up annually and prn.   After visit summary provided.

## 2023-01-19 ENCOUNTER — Ambulatory Visit (INDEPENDENT_AMBULATORY_CARE_PROVIDER_SITE_OTHER): Payer: No Typology Code available for payment source | Admitting: Obstetrics and Gynecology

## 2023-01-19 ENCOUNTER — Telehealth: Payer: Self-pay | Admitting: Obstetrics and Gynecology

## 2023-01-19 ENCOUNTER — Encounter: Payer: Self-pay | Admitting: Obstetrics and Gynecology

## 2023-01-19 ENCOUNTER — Other Ambulatory Visit: Payer: Self-pay

## 2023-01-19 VITALS — BP 128/84 | HR 76 | Ht 66.5 in | Wt 216.0 lb

## 2023-01-19 DIAGNOSIS — N3946 Mixed incontinence: Secondary | ICD-10-CM

## 2023-01-19 DIAGNOSIS — Z01419 Encounter for gynecological examination (general) (routine) without abnormal findings: Secondary | ICD-10-CM

## 2023-01-19 DIAGNOSIS — N951 Menopausal and female climacteric states: Secondary | ICD-10-CM | POA: Diagnosis not present

## 2023-01-19 MED ORDER — ESTRADIOL 0.0375 MG/24HR TD PTTW: 1.0000 | MEDICATED_PATCH | TRANSDERMAL | 11 refills | Status: DC

## 2023-01-19 NOTE — Telephone Encounter (Signed)
Order placed for referral for pelvic floor. To be followed up via workqueue. Routing to provider for final review.

## 2023-01-19 NOTE — Addendum Note (Signed)
Addended by: Ardell Isaacs, Debbe Bales E on: 01/19/2023 09:33 AM   Modules accepted: Orders

## 2023-01-19 NOTE — Telephone Encounter (Signed)
Please make a referral to [facility] Pelvic Floor Therapy for mixed incontinence.

## 2023-01-19 NOTE — Patient Instructions (Signed)

## 2023-01-20 LAB — ESTRADIOL: Estradiol: 16 pg/mL

## 2023-01-20 LAB — FOLLICLE STIMULATING HORMONE: FSH: 21.4 m[IU]/mL

## 2023-01-21 ENCOUNTER — Ambulatory Visit (INDEPENDENT_AMBULATORY_CARE_PROVIDER_SITE_OTHER): Payer: No Typology Code available for payment source | Admitting: Adult Health

## 2023-01-21 ENCOUNTER — Encounter (INDEPENDENT_AMBULATORY_CARE_PROVIDER_SITE_OTHER): Payer: Self-pay | Admitting: Adult Health

## 2023-01-21 VITALS — BP 109/73 | HR 63 | Temp 98.1°F | Ht 67.0 in | Wt 213.0 lb

## 2023-01-21 DIAGNOSIS — Z6833 Body mass index (BMI) 33.0-33.9, adult: Secondary | ICD-10-CM

## 2023-01-21 DIAGNOSIS — E559 Vitamin D deficiency, unspecified: Secondary | ICD-10-CM

## 2023-01-21 DIAGNOSIS — E669 Obesity, unspecified: Secondary | ICD-10-CM | POA: Diagnosis not present

## 2023-01-21 DIAGNOSIS — E88819 Insulin resistance, unspecified: Secondary | ICD-10-CM | POA: Diagnosis not present

## 2023-01-21 DIAGNOSIS — Z79890 Hormone replacement therapy (postmenopausal): Secondary | ICD-10-CM | POA: Diagnosis not present

## 2023-01-21 MED ORDER — ERGOCALCIFEROL 1.25 MG (50000 UT) PO CAPS
50000.0000 [IU] | ORAL_CAPSULE | ORAL | 0 refills | Status: DC
Start: 2023-01-21 — End: 2023-03-04

## 2023-01-21 NOTE — Progress Notes (Signed)
WEIGHT SUMMARY AND BIOMETRICS  Vitals Temp: 98.1 F (36.7 C) BP: 109/73 Pulse Rate: 63 SpO2: 98 %   Anthropometric Measurements Height: 5\' 7"  (1.702 m) Weight: 213 lb (96.6 kg) BMI (Calculated): 33.35 Weight at Last Visit: 213lb Weight Lost Since Last Visit: 0 Weight Gained Since Last Visit: 0 Starting Weight: 224lb Total Weight Loss (lbs): 11 lb (4.99 kg)   Body Composition  Body Fat %: 41.1 % Fat Mass (lbs): 87.6 lbs Muscle Mass (lbs): 119 lbs Total Body Water (lbs): 78.4 lbs Visceral Fat Rating : 11   Other Clinical Data Fasting: yes Labs: no Today's Visit #: 35 Starting Date: 06/18/20    Chief Complaint:   OBESITY Tricia Miranda is here to discuss her progress with her obesity treatment plan. She is on the the Category 2 Plan and states she is following her eating plan approximately 90 % of the time. She states she is exercising Rowing, Pilates 60+/50 minutes 3/1 times per week.   Interim History:  Tricia Miranda has been on vacation for 9 days. She reports daily walking and eating out more during her travels.  Reviewed Bioimpedance results with pt: Muscle Mass: +0.4 lb Adipose Mass: -0.8 lb  Subjective:   1. Hormone replacement therapy Recent annual OV with GYN- started on estradiol (VIVELLE-DOT) 0.0375 MG/24HR  She has yet to start HRT patches She hopes the patch will treat perimenopausal sx's and diffuse pain. She denies tobacco/vape use   Latest Reference Range & Units 01/19/23 09:33  Estradiol pg/mL 16    Latest Reference Range & Units 01/19/23 09:33  FSH mIU/mL 21.4   2. Insulin resistance  Latest Reference Range & Units 11/16/22 11:48  INSULIN 2.6 - 24.9 uIU/mL 16.4   She is not currently on any antidiabetic therapy  3. Vitamin D deficiency  Latest Reference Range & Units 11/16/22 11:48  Vitamin D, 25-Hydroxy 30.0 - 100.0 ng/mL 43.9   She is on weekly Ergocalciferol- denies N/V/Muscle Weakness  Assessment/Plan:   1. Hormone  replacement therapy Remain tobacco free Start HRT per GYN instructions  2. Insulin resistance Continue to increase protein intake and regular exercise  3. Vitamin D deficiency Refill - ergocalciferol (DRISDOL) 1.25 MG (50000 UT) capsule; Take 1 capsule (50,000 Units total) by mouth once a week.  Dispense: 4 capsule; Refill: 0  4. Obesity, Current BMI 33.35  [name] is currently in the action stage of change. As such, her goal is to continue with weight loss efforts. She has agreed to the Category 2 Plan.   Exercise goals: For substantial health benefits, adults should do at least 150 minutes (2 hours and 30 minutes) a week of moderate-intensity, or 75 minutes (1 hour and 15 minutes) a week of vigorous-intensity aerobic physical activity, or an equivalent combination of moderate- and vigorous-intensity aerobic activity. Aerobic activity should be performed in episodes of at least 10 minutes, and preferably, it should be spread throughout the week.  Behavioral modification strategies: increasing lean protein intake, decreasing simple carbohydrates, increasing vegetables, increasing water intake, meal planning and cooking strategies, and planning for success.  [name] has agreed to follow-up with our clinic in 4 weeks. She was informed of the importance of frequent follow-up visits to maximize her success with intensive lifestyle modifications for her multiple health conditions.   Check Fasting Labs at next OV  Objective:   Blood pressure 109/73, pulse 63, temperature 98.1 F (36.7 C), height 5\' 7"  (1.702 m), weight 213 lb (96.6 kg), last menstrual period 11/15/2018,  SpO2 98%. Body mass index is 33.36 kg/m.  General: Cooperative, alert, well developed, in no acute distress. HEENT: Conjunctivae and lids unremarkable. Cardiovascular: Regular rhythm.  Lungs: Normal work of breathing. Neurologic: No focal deficits.   Lab Results  Component Value Date   CREATININE 0.57 11/16/2022   BUN 20  11/16/2022   NA 142 11/16/2022   K 4.5 11/16/2022   CL 103 11/16/2022   CO2 24 11/16/2022   Lab Results  Component Value Date   ALT 29 11/16/2022   AST 16 11/16/2022   ALKPHOS 98 11/16/2022   BILITOT 0.3 11/16/2022   Lab Results  Component Value Date   HGBA1C 5.2 11/16/2022   HGBA1C 5.3 05/07/2022   HGBA1C 5.1 05/20/2021   HGBA1C 5.2 05/28/2020   HGBA1C 5.1 11/28/2019   Lab Results  Component Value Date   INSULIN 16.4 11/16/2022   INSULIN 13.1 05/07/2022   INSULIN 9.2 05/20/2021   INSULIN 6.9 06/18/2020   Lab Results  Component Value Date   TSH 1.140 11/16/2022   Lab Results  Component Value Date   CHOL 215 (H) 11/16/2022   HDL 50 11/16/2022   LDLCALC 133 (H) 11/16/2022   TRIG 178 (H) 11/16/2022   CHOLHDL 4.3 11/16/2022   Lab Results  Component Value Date   VD25OH 43.9 11/16/2022   VD25OH 36.6 05/07/2022   VD25OH 46.1 05/20/2021   Lab Results  Component Value Date   WBC 5.2 08/03/2022   HGB 13.2 08/03/2022   HCT 40.3 08/03/2022   MCV 85.0 08/03/2022   PLT 254 08/03/2022   Lab Results  Component Value Date   IRON 47 01/15/2017   TIBC 448 01/15/2017   FERRITIN 122 06/18/2020   Attestation Statements:   Reviewed by clinician on day of visit: allergies, medications, problem list, medical history, surgical history, family history, social history, and previous encounter notes.  I have reviewed the above documentation for accuracy and completeness, and I agree with the above. -  Memphis Decoteau d. Isabel Ardila, NP-C
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
## 2023-02-08 ENCOUNTER — Other Ambulatory Visit: Payer: No Typology Code available for payment source

## 2023-02-08 DIAGNOSIS — E559 Vitamin D deficiency, unspecified: Secondary | ICD-10-CM

## 2023-02-08 DIAGNOSIS — E039 Hypothyroidism, unspecified: Secondary | ICD-10-CM

## 2023-02-08 DIAGNOSIS — E782 Mixed hyperlipidemia: Secondary | ICD-10-CM

## 2023-02-08 DIAGNOSIS — Z0000 Encounter for general adult medical examination without abnormal findings: Secondary | ICD-10-CM

## 2023-02-08 DIAGNOSIS — E7800 Pure hypercholesterolemia, unspecified: Secondary | ICD-10-CM

## 2023-02-12 ENCOUNTER — Ambulatory Visit: Payer: No Typology Code available for payment source | Admitting: Internal Medicine

## 2023-02-16 ENCOUNTER — Other Ambulatory Visit (INDEPENDENT_AMBULATORY_CARE_PROVIDER_SITE_OTHER): Payer: Self-pay | Admitting: Adult Health

## 2023-02-16 DIAGNOSIS — E559 Vitamin D deficiency, unspecified: Secondary | ICD-10-CM

## 2023-02-18 ENCOUNTER — Ambulatory Visit (INDEPENDENT_AMBULATORY_CARE_PROVIDER_SITE_OTHER): Payer: No Typology Code available for payment source | Admitting: Adult Health

## 2023-02-19 NOTE — Progress Notes (Addendum)
Patient Care Team: Margaree Mackintosh, MD as PCP - General (Internal Medicine)  Visit Date: 02/26/23  Subjective:    Patient ID: Tricia Miranda , Female   DOB: July 03, 1965, 57 y.o.    MRN: [id number]   57 y.o. Female presents today for a 36-month follow-up. Patient has a past medical history of anxiety, bronchitis, basal cell cancer, uterine fibroids, hyperlipidemia, hypothyroidism, joint pain, iron deficiency, Vitamin D deficiency, osteoarthritis, sleep apnea, Vitamin B deficiency.  History of obesity and seen at [facility]. She is exercising more frequently.  Recently started hormone replacement therapy with estradiol patch for menopausal symptoms.   History of anxiety and depression treated with propranolol 10 mg as needed, fluoxetine 20 mg daily, alprazolam 0.25 mg twice daily as needed.   History of hyperlipidemia treated with ezetimibe 10 mg daily, pravastatin 40 mg daily. LDL slightly elevated at 100. Denies negative side effects from pravastatin.   History of Vitamin D deficiency treated with ergocalciferol 50,000 units weekly.   History of musculoskeletal pain treated with meloxicam 15 mg as needed.   History of hypothyroidism treated with Synthroid 100 mcg daily before breakfast. TSH at 2.04.   Labs reviewed today. Glucose normal. Liver, kidney function normal. Electrolytes normal. Blood proteins normal. CBC normal. HGBA1c at 5.2%.   Mammogram normal on 12/24/22.    Colonoscopy last completed 03/22/17. Results showed one 4 mm polyp in ascending colon, removed. Examination otherwise normal. Pathology showed benign polypoid colorectal mucosa with lymphoid aggregate. Recommended repeat in 2028.  Past Medical History:  Diagnosis Date   Anxiety    Anxiety    Bronchitis    Cancer (HCC)    basal cell- tiny   Cancer (HCC)    Fibroids    uterine    High cholesterol    Hypothyroidism    Joint pain    Low iron    Low vitamin D level    Osteoarthritis    PONV  (postoperative nausea and vomiting)    Sleep apnea    uses oral device for mild sleep apnea    Thyroid disease    hypothyroidism   Vitamin B deficiency      Family History  Problem Relation Age of Onset   Hypertension Mother    High Cholesterol Mother    Sleep apnea Mother    Heart disease Father    Alcohol abuse Father    Anxiety disorder Father    Stomach cancer Other    Colon cancer Neg Hx    Esophageal cancer Neg Hx    Pancreatic cancer Neg Hx    Prostate cancer Neg Hx    Rectal cancer Neg Hx     Social History   Social History Narrative   Social history: 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  She is married to an attorney who teaches at [employer] [address].  1 son.  Does not smoke.  Does not get a lot of exercise.       Family history: Father with history of MI and alcoholism.  Mother in good health.  1 sister with history of hip dysplasia.  Maternal grandmother with history of adult onset diabetes.          Review of Systems  Constitutional:  Negative for fever and malaise/fatigue.  HENT:  Negative for congestion.   Eyes:  Negative for blurred vision.  Respiratory:  Negative for cough and shortness of breath.   Cardiovascular:  Negative  for chest pain, palpitations and leg swelling.  Gastrointestinal:  Negative for vomiting.  Musculoskeletal:  Negative for back pain.  Skin:  Negative for rash.  Neurological:  Negative for loss of consciousness and headaches.        Objective:   Vitals: BP 110/70   Pulse 78   Ht 5\' 7"  (1.702 m)   Wt 221 lb (100.2 kg)   LMP 11/15/2018   SpO2 98%   BMI 34.61 kg/m    Physical Exam Vitals and nursing note reviewed.  Constitutional:      General: She is not in acute distress.    Appearance: Normal appearance. She is not toxic-appearing.  HENT:     Head: Normocephalic and atraumatic.  Pulmonary:     Effort: Pulmonary effort is normal.  Skin:    General: Skin is warm and dry.   Neurological:     Mental Status: She is alert and oriented to person, place, and time. Mental status is at baseline.  Psychiatric:        Mood and Affect: Mood normal.        Behavior: Behavior normal.        Thought Content: Thought content normal.        Judgment: Judgment normal.       Results:   Studies obtained and personally reviewed by me:  Mammogram normal on 12/24/22.    Colonoscopy last completed 03/22/17. Results showed one 4 mm polyp in ascending colon, removed. Examination otherwise normal. Pathology showed benign polypoid colorectal mucosa with lymphoid aggregate. Recommended repeat in 2028.  Labs:       Component Value Date/Time   NA 140 02/22/2023 0928   NA 142 11/16/2022 1148   K 4.9 02/22/2023 0928   CL 104 02/22/2023 0928   CO2 28 02/22/2023 0928   GLUCOSE 94 02/22/2023 0928   BUN 17 02/22/2023 0928   BUN 20 11/16/2022 1148   CREATININE 0.58 02/22/2023 0928   CALCIUM 9.5 02/22/2023 0928   PROT 6.8 02/22/2023 0928   PROT 6.8 11/16/2022 1148   ALBUMIN 4.3 11/16/2022 1148   AST 15 02/22/2023 0928   ALT 19 02/22/2023 0928   ALKPHOS 98 11/16/2022 1148   BILITOT 0.5 02/22/2023 0928   BILITOT 0.3 11/16/2022 1148   GFRNONAA >60 07/08/2022 1009   GFRNONAA 108 05/28/2020 1145   GFRAA 126 05/28/2020 1145     Lab Results  Component Value Date   WBC 6.0 02/22/2023   HGB 13.9 02/22/2023   HCT 42.6 02/22/2023   MCV 86.8 02/22/2023   PLT 280 02/22/2023    Lab Results  Component Value Date   CHOL 192 02/22/2023   HDL 75 02/22/2023   LDLCALC 100 (H) 02/22/2023   TRIG 82 02/22/2023   CHOLHDL 2.6 02/22/2023    Lab Results  Component Value Date   HGBA1C 5.2 02/22/2023     Lab Results  Component Value Date   TSH 2.04 02/22/2023      Assessment & Plan:   Obesity: seen at [facility]. She is exercising more frequently.  History of COVID-08 May 2020 and again in July 2024  History of left knee osteoarthritis-treated with  meloxicam  History of fine tremor treated with propranolol  History of anxiety and depression fluoxetine 20 mg daily, alprazolam 0.25 mg twice daily as needed.  Hyperlipidemia: treated with ezetimibe 10 mg daily, pravastatin 40 mg daily. LDL slightly elevated at 100. Denies negative side effects from pravastatin.  Has seen Dr. Rennis Golden.  Vitamin D deficiency: treated with ergocalciferol 50,000 units weekly.  Musculoskeletal pain: treated with meloxicam 15 mg as needed.  Hypothyroidism: treated with Synthroid 100 mcg daily before breakfast. Refilled thyroid replacement medication. TSH stable at 2.04.  She is using estradiol patches for menopausal symptoms.  Mammogram normal on 12/24/22.  History of laparoscopic appendectomy 2024   Colonoscopy last completed 03/22/17. Results showed one 4 mm polyp in ascending colon, removed. Examination otherwise normal. Pathology showed benign polypoid colorectal mucosa with lymphoid aggregate. Recommended repeat in 2028.  Return in 6 months for health maintenance exam.  Flu vaccine given.  No change in medications.  Continue diet and exercise efforts.  I,Alexander Ruley,acting as a Neurosurgeon for Margaree Mackintosh, MD.,have documented all relevant documentation on the behalf of Margaree Mackintosh, MD,as directed by  Margaree Mackintosh, MD while in the presence of Margaree Mackintosh, MD.   I, Margaree Mackintosh, MD, have reviewed all documentation for this visit. The documentation on 03/06/23 for the exam, diagnosis, procedures, and orders are all accurate and complete.

## 2023-02-22 ENCOUNTER — Other Ambulatory Visit: Payer: No Typology Code available for payment source

## 2023-02-22 DIAGNOSIS — E039 Hypothyroidism, unspecified: Secondary | ICD-10-CM

## 2023-02-22 DIAGNOSIS — E66812 Obesity, class 2: Secondary | ICD-10-CM

## 2023-02-22 DIAGNOSIS — Z79890 Hormone replacement therapy (postmenopausal): Secondary | ICD-10-CM

## 2023-02-22 DIAGNOSIS — Z0000 Encounter for general adult medical examination without abnormal findings: Secondary | ICD-10-CM

## 2023-02-22 DIAGNOSIS — E88819 Insulin resistance, unspecified: Secondary | ICD-10-CM

## 2023-02-22 DIAGNOSIS — E782 Mixed hyperlipidemia: Secondary | ICD-10-CM

## 2023-02-23 LAB — CBC WITH DIFFERENTIAL/PLATELET
Absolute Lymphocytes: 2286 {cells}/uL (ref 850–3900)
Absolute Monocytes: 330 {cells}/uL (ref 200–950)
Basophils Absolute: 60 {cells}/uL (ref 0–200)
Basophils Relative: 1 %
Eosinophils Absolute: 180 {cells}/uL (ref 15–500)
Eosinophils Relative: 3 %
HCT: 42.6 % (ref 35.0–45.0)
Hemoglobin: 13.9 g/dL (ref 11.7–15.5)
MCH: 28.3 pg (ref 27.0–33.0)
MCHC: 32.6 g/dL (ref 32.0–36.0)
MCV: 86.8 fL (ref 80.0–100.0)
MPV: 10.1 fL (ref 7.5–12.5)
Monocytes Relative: 5.5 %
Neutro Abs: 3144 {cells}/uL (ref 1500–7800)
Neutrophils Relative %: 52.4 %
Platelets: 280 10*3/uL (ref 140–400)
RBC: 4.91 10*6/uL (ref 3.80–5.10)
RDW: 12.7 % (ref 11.0–15.0)
Total Lymphocyte: 38.1 %
WBC: 6 10*3/uL (ref 3.8–10.8)

## 2023-02-23 LAB — COMPLETE METABOLIC PANEL WITH GFR
AG Ratio: 1.8 (calc) (ref 1.0–2.5)
ALT: 19 U/L (ref 6–29)
AST: 15 U/L (ref 10–35)
Albumin: 4.4 g/dL (ref 3.6–5.1)
Alkaline phosphatase (APISO): 91 U/L (ref 37–153)
BUN: 17 mg/dL (ref 7–25)
CO2: 28 mmol/L (ref 20–32)
Calcium: 9.5 mg/dL (ref 8.6–10.4)
Chloride: 104 mmol/L (ref 98–110)
Creat: 0.58 mg/dL (ref 0.50–1.03)
Globulin: 2.4 g/dL (ref 1.9–3.7)
Glucose, Bld: 94 mg/dL (ref 65–99)
Potassium: 4.9 mmol/L (ref 3.5–5.3)
Sodium: 140 mmol/L (ref 135–146)
Total Bilirubin: 0.5 mg/dL (ref 0.2–1.2)
Total Protein: 6.8 g/dL (ref 6.1–8.1)
eGFR: 105 mL/min/{1.73_m2} (ref >=60)

## 2023-02-23 LAB — LIPID PANEL
Cholesterol: 192 mg/dL (ref <200)
HDL: 75 mg/dL (ref >=50)
LDL Cholesterol (Calc): 100 mg/dL — ABNORMAL HIGH
Non-HDL Cholesterol (Calc): 117 mg/dL (ref <130)
Total CHOL/HDL Ratio: 2.6 (calc) (ref <5.0)
Triglycerides: 82 mg/dL (ref <150)

## 2023-02-23 LAB — TSH: TSH: 2.04 m[IU]/L (ref 0.40–4.50)

## 2023-02-23 LAB — HEMOGLOBIN A1C
Hgb A1c MFr Bld: 5.2 %{Hb} (ref <5.7)
Mean Plasma Glucose: 103 mg/dL
eAG (mmol/L): 5.7 mmol/L

## 2023-02-26 ENCOUNTER — Encounter: Payer: Self-pay | Admitting: Internal Medicine

## 2023-02-26 ENCOUNTER — Ambulatory Visit (INDEPENDENT_AMBULATORY_CARE_PROVIDER_SITE_OTHER): Payer: No Typology Code available for payment source | Admitting: Internal Medicine

## 2023-02-26 VITALS — BP 110/70 | HR 78 | Ht 67.0 in | Wt 221.0 lb

## 2023-02-26 DIAGNOSIS — F32A Depression, unspecified: Secondary | ICD-10-CM

## 2023-02-26 DIAGNOSIS — E782 Mixed hyperlipidemia: Secondary | ICD-10-CM

## 2023-02-26 DIAGNOSIS — Z23 Encounter for immunization: Secondary | ICD-10-CM

## 2023-02-26 DIAGNOSIS — Z6834 Body mass index (BMI) 34.0-34.9, adult: Secondary | ICD-10-CM | POA: Diagnosis not present

## 2023-02-26 DIAGNOSIS — F419 Anxiety disorder, unspecified: Secondary | ICD-10-CM

## 2023-02-26 DIAGNOSIS — E039 Hypothyroidism, unspecified: Secondary | ICD-10-CM | POA: Diagnosis not present

## 2023-02-26 DIAGNOSIS — E538 Deficiency of other specified B group vitamins: Secondary | ICD-10-CM

## 2023-02-26 DIAGNOSIS — Z79890 Hormone replacement therapy (postmenopausal): Secondary | ICD-10-CM

## 2023-02-26 DIAGNOSIS — M7918 Myalgia, other site: Secondary | ICD-10-CM | POA: Diagnosis not present

## 2023-02-26 DIAGNOSIS — E559 Vitamin D deficiency, unspecified: Secondary | ICD-10-CM

## 2023-02-26 DIAGNOSIS — Z9049 Acquired absence of other specified parts of digestive tract: Secondary | ICD-10-CM

## 2023-02-26 MED ORDER — SYNTHROID 100 MCG PO TABS: 100.0000 ug | ORAL_TABLET | Freq: Every day | ORAL | 1 refills | Status: DC

## 2023-03-04 ENCOUNTER — Encounter (INDEPENDENT_AMBULATORY_CARE_PROVIDER_SITE_OTHER): Payer: Self-pay | Admitting: Adult Health

## 2023-03-04 ENCOUNTER — Ambulatory Visit (INDEPENDENT_AMBULATORY_CARE_PROVIDER_SITE_OTHER): Payer: No Typology Code available for payment source | Admitting: Adult Health

## 2023-03-04 VITALS — BP 116/79 | HR 65 | Temp 98.8°F | Ht 67.0 in | Wt 214.0 lb

## 2023-03-04 DIAGNOSIS — E559 Vitamin D deficiency, unspecified: Secondary | ICD-10-CM | POA: Diagnosis not present

## 2023-03-04 DIAGNOSIS — E88819 Insulin resistance, unspecified: Secondary | ICD-10-CM

## 2023-03-04 DIAGNOSIS — Z6833 Body mass index (BMI) 33.0-33.9, adult: Secondary | ICD-10-CM

## 2023-03-04 DIAGNOSIS — E669 Obesity, unspecified: Secondary | ICD-10-CM | POA: Diagnosis not present

## 2023-03-04 MED ORDER — ERGOCALCIFEROL 1.25 MG (50000 UT) PO CAPS: 50000.0000 [IU] | ORAL_CAPSULE | ORAL | 0 refills | Status: DC

## 2023-03-04 NOTE — Progress Notes (Signed)
WEIGHT SUMMARY AND BIOMETRICS  Vitals Temp: 98.8 F (37.1 C) BP: 116/79 Pulse Rate: 65 SpO2: 97 %   Anthropometric Measurements Height: 5\' 7"  (1.702 m) Weight: 214 lb (97.1 kg) BMI (Calculated): 33.51 Weight at Last Visit: 213 lb Weight Lost Since Last Visit: 0 Weight Gained Since Last Visit: 1 lb Starting Weight: 224 lb Total Weight Loss (lbs): 10 lb (4.536 kg)   Body Composition  Body Fat %: 42.2 % Fat Mass (lbs): 90.4 lbs Muscle Mass (lbs): 117.8 lbs Total Body Water (lbs): 79.8 lbs Visceral Fat Rating : 11   Other Clinical Data Fasting: no Labs: no Today's Visit #: 36 Starting Date: 06/18/20    Chief Complaint:   OBESITY Tricia Miranda is here to discuss her progress with her obesity treatment plan. She is on the the Category 2 Plan and states she is following her eating plan approximately 90-95 % of the time.  She states she is exercising Rowing/Pilates/Walking 50-90 minutes 4 times per week.   Interim History:  Tricia Miranda had recent OV with fasting Labs with her PCP/Dr. Lenord Fellers on 02/26/2023  Tricia Miranda estimates to eat on plan at least 90% or 6 days a week.  Subjective:   1. Vitamin D deficiency She is on weekly Ergocalciferol- denies N/V/Muscle Weakness  2. Insulin resistance  Latest Reference Range & Units 02/22/23 09:28  eAG (mmol/L) mmol/L 5.7  Glucose 65 - 99 mg/dL 94  Hemoglobin W0J <8.1 % of total Hgb 5.2    Latest Reference Range & Units 11/16/22 11:48  Glucose 70 - 99 mg/dL 86  Hemoglobin X9J 4.8 - 5.6 % 5.2  Est. average glucose Bld gHb Est-mCnc mg/dL 478  INSULIN 2.6 - 29.5 uIU/mL 16.4   She feels that she is consistently consuming protein at each meal. She would like to increase her daily vegetable intake  Assessment/Plan:   1. Vitamin D deficiency Check Labs and Refill - ergocalciferol (DRISDOL) 1.25 MG (50000 UT) capsule; Take 1 capsule (50,000 Units total) by mouth once a week.  Dispense: 4 capsule; Refill: 0 - VITAMIN D 25  Hydroxy (Vit-D Deficiency, Fractures)  2. Insulin resistance Continue prescribed Cat 2 Meal Plan Strive to consume 3 different vegetables at dinner  3. Obesity, Current BMI 33.51  Tricia Miranda is currently in the action stage of change. As such, her goal is to continue with weight loss efforts. She has agreed to the Category 2 Plan.   Exercise goals: For substantial health benefits, adults should do at least 150 minutes (2 hours and 30 minutes) a week of moderate-intensity, or 75 minutes (1 hour and 15 minutes) a week of vigorous-intensity aerobic physical activity, or an equivalent combination of moderate- and vigorous-intensity aerobic activity. Aerobic activity should be performed in episodes of at least 10 minutes, and preferably, it should be spread throughout the week.  Behavioral modification strategies: increasing lean protein intake, decreasing simple carbohydrates, increasing vegetables, increasing water intake, decreasing liquid calories, no skipping meals, meal planning and cooking strategies, keeping healthy foods in the home, ways to avoid boredom eating, and planning for success.  Tricia Miranda has agreed to follow-up with our clinic in 4 weeks. She was informed of the importance of frequent follow-up visits to maximize her success with intensive lifestyle modifications for her multiple health conditions.   Tricia Miranda was informed we would discuss her lab results at her next visit unless there is a critical issue that needs to be addressed sooner. Tricia Miranda agreed to keep her next visit at  the agreed upon time to discuss these results.  Objective:   Blood pressure 116/79, pulse 65, temperature 98.8 F (37.1 C), height 5\' 7"  (1.702 m), weight 214 lb (97.1 kg), last menstrual period 11/15/2018, SpO2 97%. Body mass index is 33.52 kg/m.  General: Cooperative, alert, well developed, in no acute distress. HEENT: Conjunctivae and lids unremarkable. Cardiovascular: Regular rhythm.  Lungs: Normal work of  breathing. Neurologic: No focal deficits.   Lab Results  Component Value Date   CREATININE 0.58 02/22/2023   BUN 17 02/22/2023   NA 140 02/22/2023   K 4.9 02/22/2023   CL 104 02/22/2023   CO2 28 02/22/2023   Lab Results  Component Value Date   ALT 19 02/22/2023   AST 15 02/22/2023   ALKPHOS 98 11/16/2022   BILITOT 0.5 02/22/2023   Lab Results  Component Value Date   HGBA1C 5.2 02/22/2023   HGBA1C 5.2 11/16/2022   HGBA1C 5.3 05/07/2022   HGBA1C 5.1 05/20/2021   HGBA1C 5.2 05/28/2020   Lab Results  Component Value Date   INSULIN 16.4 11/16/2022   INSULIN 13.1 05/07/2022   INSULIN 9.2 05/20/2021   INSULIN 6.9 06/18/2020   Lab Results  Component Value Date   TSH 2.04 02/22/2023   Lab Results  Component Value Date   CHOL 192 02/22/2023   HDL 75 02/22/2023   LDLCALC 100 (H) 02/22/2023   TRIG 82 02/22/2023   CHOLHDL 2.6 02/22/2023   Lab Results  Component Value Date   VD25OH 43.9 11/16/2022   VD25OH 36.6 05/07/2022   VD25OH 46.1 05/20/2021   Lab Results  Component Value Date   WBC 6.0 02/22/2023   HGB 13.9 02/22/2023   HCT 42.6 02/22/2023   MCV 86.8 02/22/2023   PLT 280 02/22/2023   Lab Results  Component Value Date   IRON 47 01/15/2017   TIBC 448 01/15/2017   FERRITIN 122 06/18/2020   Attestation Statements:   Reviewed by clinician on day of visit: allergies, medications, problem list, medical history, surgical history, family history, social history, and previous encounter notes.  I have reviewed the above documentation for accuracy and completeness, and I agree with the above. -  Sojourner Behringer d. Oree Hislop, NP-C

## 2023-03-05 LAB — VITAMIN D 25 HYDROXY (VIT D DEFICIENCY, FRACTURES): Vit D, 25-Hydroxy: 40.1 ng/mL (ref 30.0–100.0)

## 2023-03-06 NOTE — Patient Instructions (Addendum)
It was a pleasure to see you today.  Labs are stable.  Flu vaccine given.  Continue diet and exercise efforts.  No change in medications.  Return in 6 months for health maintenance exam.

## 2023-03-08 ENCOUNTER — Other Ambulatory Visit: Payer: Self-pay | Admitting: Medical Genetics

## 2023-03-08 DIAGNOSIS — Z006 Encounter for examination for normal comparison and control in clinical research program: Secondary | ICD-10-CM

## 2023-03-12 ENCOUNTER — Telehealth: Payer: Self-pay | Admitting: Internal Medicine

## 2023-03-12 NOTE — Telephone Encounter (Signed)
Patient wants a call back to confirm lab work done on 11/4 will suffice for her visit with Dr. Rennis Golden on 11/26 or if she will need to have a more current lab draw.

## 2023-03-16 ENCOUNTER — Encounter: Payer: Self-pay | Admitting: Internal Medicine

## 2023-03-16 ENCOUNTER — Ambulatory Visit: Payer: No Typology Code available for payment source | Admitting: Cardiovascular Disease

## 2023-03-16 ENCOUNTER — Ambulatory Visit: Payer: No Typology Code available for payment source | Attending: Cardiovascular Disease | Admitting: Internal Medicine

## 2023-03-16 VITALS — BP 112/70 | HR 61 | Ht 67.0 in | Wt 218.8 lb

## 2023-03-16 DIAGNOSIS — I700 Atherosclerosis of aorta: Secondary | ICD-10-CM

## 2023-03-16 DIAGNOSIS — T466X5D Adverse effect of antihyperlipidemic and antiarteriosclerotic drugs, subsequent encounter: Secondary | ICD-10-CM

## 2023-03-16 DIAGNOSIS — M791 Myalgia, unspecified site: Secondary | ICD-10-CM | POA: Diagnosis not present

## 2023-03-16 DIAGNOSIS — T466X5A Adverse effect of antihyperlipidemic and antiarteriosclerotic drugs, initial encounter: Secondary | ICD-10-CM

## 2023-03-16 DIAGNOSIS — E785 Hyperlipidemia, unspecified: Secondary | ICD-10-CM | POA: Diagnosis not present
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
## 2023-03-16 NOTE — Patient Instructions (Signed)
Medication Instructions:  NO CHANGES  *If you need a refill on your cardiac medications before your next appointment, please call your pharmacy*   Lab Work: FASTING lipid panel and LPa in 1 year  If you have labs (blood work) drawn today and your tests are completely normal, you will receive your results only by: MyChart Message (if you have MyChart) OR A paper copy in the mail If you have any lab test that is abnormal or we need to change your treatment, we will call you to review the results.   Follow-Up: At [facility], you and your health needs are our priority.  As part of our continuing mission to provide you with exceptional heart care, we have created designated Provider Care Teams.  These Care Teams include your primary Cardiologist (physician) and Advanced Practice Providers (APPs -  Physician Assistants and Nurse Practitioners) who all work together to provide you with the care you need, when you need it.  We recommend signing up for the patient portal called "MyChart".  Sign up information is provided on this After Visit Summary.  MyChart is used to connect with patients for Virtual Visits (Telemedicine).  Patients are able to view lab/test results, encounter notes, upcoming appointments, etc.  Non-urgent messages can be sent to your provider as well.   To learn more about what you can do with MyChart, go to [email or web address].    Your next appointment:   12 months with Dr. Rennis Golden

## 2023-03-29 NOTE — Therapy (Signed)
OUTPATIENT PHYSICAL THERAPY FEMALE PELVIC EVALUATION   Patient Name: Tricia Miranda MRN: [id number] DOB:1965/08/11, 57 y.o., female Today's Date: 03/29/2023  END OF SESSION:   Past Medical History:  Diagnosis Date   Anxiety    Anxiety    Bronchitis    Cancer (HCC)    basal cell- tiny   Cancer (HCC)    Fibroids    uterine    High cholesterol    Hypothyroidism    Joint pain    Low iron    Low vitamin D level    Osteoarthritis    PONV (postoperative nausea and vomiting)    Sleep apnea    uses oral device for mild sleep apnea    Thyroid disease    hypothyroidism   Vitamin B deficiency    Past Surgical History:  Procedure Laterality Date   ABDOMINAL HYSTERECTOMY     APPENDECTOMY  06/2022   COLONOSCOPY     with benign  polyp removal    DILITATION & CURRETTAGE/HYSTROSCOPY WITH VERSAPOINT RESECTION N/A 03/16/2014   Procedure: DILATATION & CURETTAGE/HYSTEROSCOPY WITH VERSAPOINT RESECTION;  Surgeon: Genia Del, MD;  Location: [facility];  Service: Gynecology;  Laterality: N/A;  1 hr.   HYSTERECTOMY ABDOMINAL WITH SALPINGECTOMY Bilateral 11/15/2018   Procedure: HYSTERECTOMY ABDOMINAL WITH  BILATERAL SALPINGECTOMY;  Surgeon: Genia Del, MD;  Location: [facility] [place];  Service: Gynecology;  Laterality: Bilateral;   LAPAROSCOPIC APPENDECTOMY N/A 07/08/2022   Procedure: APPENDECTOMY LAPAROSCOPIC;  Surgeon: Emelia Loron, MD;  Location: [facility] OR;  Service: General;  Laterality: N/A;   MOHS SURGERY Left a while- unknown   under left eyelid   WISDOM TOOTH EXTRACTION     Patient Active Problem List   Diagnosis Date Noted   S/P laparoscopic appendectomy 07/08/2022   GAD (generalized anxiety disorder) 02/23/2022   Vitamin B12 deficiency 02/23/2022   Upset stomach 10/16/2021   Other hyperlipidemia 10/16/2021   Class 2 severe obesity with serious comorbidity and body mass index (BMI) of 35.0 to 35.9 in adult [facility]) 10/16/2021   Vitamin D deficiency  08/12/2020   Family history of colonic polyps 05/31/2020   Irritable bowel syndrome 05/31/2020   Postoperative state 11/15/2018   Post-operative state 11/15/2018   Snoring 11/23/2017   Diarrhea 06/23/2012   History of vitamin D deficiency 11/14/2011   Essential tremor 11/14/2011   Hypothyroidism 10/09/2010   Anxiety 10/09/2010   Migraine 10/09/2010    PCP: Margaree Mackintosh, MD   REFERRING PROVIDER:  Patton Salles, MD   REFERRING DIAG: N39.46 (ICD-10-CM) - Mixed incontinence   THERAPY DIAG:  No diagnosis found.  Rationale for Evaluation and Treatment: Rehabilitation  ONSET DATE: ***  SUBJECTIVE:  SUBJECTIVE STATEMENT: *** Fluid intake: {Yes/No:304960894}   PAIN:  Are you having pain? {yes/no:20286} NPRS scale: ***/10 Pain location: {pelvic pain location:27098}  Pain type: {type:313116} Pain description: {PAIN DESCRIPTION:21022940}   Aggravating factors: *** Relieving factors: ***  PRECAUTIONS: {Therapy precautions:24002}  RED FLAGS: {PT Red Flags:29287}   WEIGHT BEARING RESTRICTIONS: {Yes ***/No:24003}  FALLS:  Has patient fallen in last 6 months? {fallsyesno:27318}  LIVING ENVIRONMENT: Lives with: {OPRC lives with:25569::"lives with their family"} Lives in: {Lives in:25570} Stairs: {opstairs:27293} Has following equipment at home: {Assistive devices:23999}  OCCUPATION: ***  PLOF: {PLOF:24004}  PATIENT GOALS: ***  PERTINENT HISTORY:  *** Sexual abuse: {Yes/No:304960894}  BOWEL MOVEMENT: Pain with bowel movement: {yes/no:20286} Type of bowel movement:{PT BM type:27100} Fully empty rectum: {Yes/No:304960894} Leakage: {Yes/No:304960894} Pads: {Yes/No:304960894} Fiber supplement: {Yes/No:304960894}  URINATION: Pain with urination:  {yes/no:20286} Fully empty bladder: {Yes/No:304960894} Stream: {PT urination:27102} Urgency: {Yes/No:304960894} Frequency: *** Leakage: {PT leakage:27103} Pads: {Yes/No:304960894}  INTERCOURSE: Pain with intercourse: {pain with intercourse PA:27099} Ability to have vaginal penetration:  {Yes/No:304960894} Climax: *** Marinoff Scale: ***/3  PREGNANCY: Vaginal deliveries *** Tearing {Yes***/No:304960894} C-section deliveries *** Currently pregnant {Yes***/No:304960894}  PROLAPSE: {PT prolapse:27101}   OBJECTIVE:  Note: Objective measures were completed at Evaluation unless otherwise noted.  DIAGNOSTIC FINDINGS:  ***  PATIENT SURVEYS:  {rehab surveys:24030}  PFIQ-7 ***  COGNITION: Overall cognitive status: {cognition:24006}     SENSATION: Light touch: {intact/deficits:24005} Proprioception: {intact/deficits:24005}  MUSCLE LENGTH: Hamstrings: Right *** deg; Left *** deg Thomas test: Right *** deg; Left *** deg  LUMBAR SPECIAL TESTS:  {lumbar special test:25242}  FUNCTIONAL TESTS:  {Functional tests:24029}  GAIT: Distance walked: *** Assistive device utilized: {Assistive devices:23999} Level of assistance: {Levels of assistance:24026} Comments: ***  POSTURE: {posture:25561}  PELVIC ALIGNMENT:  LUMBARAROM/PROM:  A/PROM A/PROM  eval  Flexion   Extension   Right lateral flexion   Left lateral flexion   Right rotation   Left rotation    (Blank rows = not tested)  LOWER EXTREMITY ROM:  {AROM/PROM:27142} ROM Right eval Left eval  Hip flexion    Hip extension    Hip abduction    Hip adduction    Hip internal rotation    Hip external rotation    Knee flexion    Knee extension    Ankle dorsiflexion    Ankle plantarflexion    Ankle inversion    Ankle eversion     (Blank rows = not tested)  LOWER EXTREMITY MMT:  MMT Right eval Left eval  Hip flexion    Hip extension    Hip abduction    Hip adduction    Hip internal rotation    Hip  external rotation    Knee flexion    Knee extension    Ankle dorsiflexion    Ankle plantarflexion    Ankle inversion    Ankle eversion     PALPATION:   General  ***                External Perineal Exam ***                             Internal Pelvic Floor ***  Patient confirms identification and approves PT to assess internal pelvic floor and treatment {yes/no:20286}  PELVIC MMT:   MMT eval  Vaginal   Internal Anal Sphincter   External Anal Sphincter   Puborectalis   Diastasis Recti   (Blank rows = not tested)  TONE: ***  PROLAPSE: ***  TODAY'S TREATMENT:                                                                                                                              DATE: ***  EVAL ***   PATIENT EDUCATION:  Education details: *** Person educated: {Person educated:25204} Education method: {Education Method:25205} Education comprehension: {Education Comprehension:25206}  HOME EXERCISE PROGRAM: ***  ASSESSMENT:  CLINICAL IMPRESSION: Patient is a *** y.o. *** who was seen today for physical therapy evaluation and treatment for ***.   OBJECTIVE IMPAIRMENTS: {opptimpairments:25111}.   ACTIVITY LIMITATIONS: {activitylimitations:27494}  PARTICIPATION LIMITATIONS: {participationrestrictions:25113}  PERSONAL FACTORS: {Personal factors:25162} are also affecting patient's functional outcome.   REHAB POTENTIAL: {rehabpotential:25112}  CLINICAL DECISION MAKING: {clinical decision making:25114}  EVALUATION COMPLEXITY: {Evaluation complexity:25115}   GOALS: Goals reviewed with patient? {yes/no:20286}  SHORT TERM GOALS: Target date: ***  *** Baseline: Goal status: INITIAL  2.  *** Baseline:  Goal status: INITIAL  3.  *** Baseline:  Goal status: INITIAL  4.  *** Baseline:  Goal status: INITIAL  5.  *** Baseline:  Goal status: INITIAL  6.  *** Baseline:  Goal status: INITIAL  LONG TERM GOALS: Target date:  ***  *** Baseline:  Goal status: INITIAL  2.  *** Baseline:  Goal status: INITIAL  3.  *** Baseline:  Goal status: INITIAL  4.  *** Baseline:  Goal status: INITIAL  5.  *** Baseline:  Goal status: INITIAL  6.  *** Baseline:  Goal status: INITIAL  PLAN:  PT FREQUENCY: {rehab frequency:25116}  PT DURATION: {rehab duration:25117}  PLANNED INTERVENTIONS: {rehab planned interventions:25118::"97110-Therapeutic exercises","97530- Therapeutic [phone]- Neuromuscular re-education","97535- Self JXBJ","47829- Manual therapy"}  PLAN FOR NEXT SESSION: ***   Ralonda Tartt, PT 03/29/2023, 9:22 AM

## 2023-04-01 ENCOUNTER — Ambulatory Visit
Payer: No Typology Code available for payment source | Attending: Obstetrics and Gynecology | Admitting: Physical Therapy

## 2023-04-01 ENCOUNTER — Encounter: Payer: Self-pay | Admitting: Physical Therapy

## 2023-04-01 ENCOUNTER — Other Ambulatory Visit: Payer: Self-pay

## 2023-04-01 ENCOUNTER — Ambulatory Visit (INDEPENDENT_AMBULATORY_CARE_PROVIDER_SITE_OTHER): Payer: No Typology Code available for payment source | Admitting: Adult Health

## 2023-04-01 DIAGNOSIS — M6281 Muscle weakness (generalized): Secondary | ICD-10-CM | POA: Diagnosis not present

## 2023-04-01 DIAGNOSIS — N3946 Mixed incontinence: Secondary | ICD-10-CM | POA: Diagnosis present

## 2023-04-01 DIAGNOSIS — M62838 Other muscle spasm: Secondary | ICD-10-CM | POA: Diagnosis not present

## 2023-04-04 ENCOUNTER — Other Ambulatory Visit (INDEPENDENT_AMBULATORY_CARE_PROVIDER_SITE_OTHER): Payer: Self-pay | Admitting: Adult Health

## 2023-04-04 DIAGNOSIS — E559 Vitamin D deficiency, unspecified: Secondary | ICD-10-CM

## 2023-04-05 ENCOUNTER — Ambulatory Visit: Payer: No Typology Code available for payment source | Admitting: Physical Therapy

## 2023-04-05 ENCOUNTER — Encounter (INDEPENDENT_AMBULATORY_CARE_PROVIDER_SITE_OTHER): Payer: Self-pay | Admitting: Adult Health

## 2023-04-05 ENCOUNTER — Ambulatory Visit (INDEPENDENT_AMBULATORY_CARE_PROVIDER_SITE_OTHER): Payer: No Typology Code available for payment source | Admitting: Adult Health

## 2023-04-05 ENCOUNTER — Encounter: Payer: Self-pay | Admitting: Physical Therapy

## 2023-04-05 VITALS — BP 119/78 | HR 66 | Temp 98.3°F | Ht 67.0 in | Wt 218.0 lb

## 2023-04-05 DIAGNOSIS — M6281 Muscle weakness (generalized): Secondary | ICD-10-CM | POA: Diagnosis not present

## 2023-04-05 DIAGNOSIS — E559 Vitamin D deficiency, unspecified: Secondary | ICD-10-CM | POA: Diagnosis not present

## 2023-04-05 DIAGNOSIS — E782 Mixed hyperlipidemia: Secondary | ICD-10-CM

## 2023-04-05 DIAGNOSIS — E88819 Insulin resistance, unspecified: Secondary | ICD-10-CM

## 2023-04-05 DIAGNOSIS — E66812 Obesity, class 2: Secondary | ICD-10-CM

## 2023-04-05 DIAGNOSIS — M62838 Other muscle spasm: Secondary | ICD-10-CM

## 2023-04-05 DIAGNOSIS — E669 Obesity, unspecified: Secondary | ICD-10-CM

## 2023-04-05 DIAGNOSIS — Z6834 Body mass index (BMI) 34.0-34.9, adult: Secondary | ICD-10-CM

## 2023-04-05 MED ORDER — ERGOCALCIFEROL 1.25 MG (50000 UT) PO CAPS: 50000.0000 [IU] | ORAL_CAPSULE | ORAL | 0 refills | Status: DC

## 2023-04-05 NOTE — Progress Notes (Signed)
WEIGHT SUMMARY AND BIOMETRICS  Vitals Temp: 98.3 F (36.8 C) BP: 119/78 Pulse Rate: 66 SpO2: 97 %   Anthropometric Measurements Height: 5\' 7"  (1.702 m) Weight: 218 lb (98.9 kg) BMI (Calculated): 34.14 Weight at Last Visit: 214lb Weight Lost Since Last Visit: 0 Weight Gained Since Last Visit: 4lb Starting Weight: 224lb Total Weight Loss (lbs): 6 lb (2.722 kg)   Body Composition  Body Fat %: 41.7 % Fat Mass (lbs): 91 lbs Muscle Mass (lbs): 120.8 lbs Total Body Water (lbs): 79.2 lbs Visceral Fat Rating : 11   Other Clinical Data Fasting: no Labs: no Today's Visit #: 37 Starting Date: 06/18/20    Chief Complaint:   OBESITY Tricia Miranda is here to discuss her progress with her obesity treatment plan. She is on the the Category 2 Plan and states she is following her eating plan approximately 90-95 % of the time.  She states she is exercising Rowing and Pilates 75/50 minutes 2-3/1 times per week.   Interim History:  Reviewed Bioimpedance result with pt: Muscle Mass: + 3 lbs Adipose Mass: +0.6 lbs  Per pt- her home scale recorded weight 218 lbs Weight at [facility] scale 218 lbs  Of Note- 2020 Hysterectomy  Subjective:   1. Mixed hyperlipidemia 03/16/2023 Cards/Dr. Guadlupe Spanish Notes  Tricia Miranda is seen today in follow-up.  At this point, her cholesterol is at the lowest point has been sometime.  She has been on pravastatin in addition to ezetimibe and seems to be tolerating it.  Total cholesterol 192, HDL 75, triglycerides 82 and LDL 100.  She had no coronary calcium.  I think she is at target.  She did tell me that at 1 point she had genetic testing and she has the APO E3/E4 variant.  This was initially to look for risk of Alzheimer's which is increased based on this variant but also can be associated with increased cholesterol.   2. Vitamin D deficiency  Latest Reference Range & Units 03/04/23 13:05  Vitamin D, 25-Hydroxy 30.0 - 100.0 ng/mL 40.1    3. Insulin  resistance  Latest Reference Range & Units 11/16/22 11:48  INSULIN 2.6 - 24.9 uIU/mL 16.4   Discussed risks/benefits of Metformin, Wegovy, and Zepbound therapies. She denies family hx of MEN 2 or MTC She denies personal hx of pancreatitis. 2020- hysterectomy   She is unsure if her insurance will cover injectable AOMs  Assessment/Plan:   1. Mixed hyperlipidemia Cards OV Notes 03/16/2023 PLAN: 1.   Tricia Miranda seems to be doing well with cholesterol that is at target LDL 1 less than 100 with no coronary calcium.  Although she has an APO E3 E4 variant based on some testing that she mention today.  She seems to be tolerating her current therapies.  I do not think there is evidence for additional therapy at this time.  Will plan repeat lipids but also check an LP(a) given her family history at follow-up in 1 year.  2. Vitamin D deficiency Refill - ergocalciferol (DRISDOL) 1.25 MG (50000 UT) capsule; Take 1 capsule (50,000 Units total) by mouth once a week.  Dispense: 4 capsule; Refill: 0  3. Insulin resistance Information provided to pt: Metformin, Wegovy, [employer] and inquire if Wegovy and Zepbound coverage  4. Obesity, Current BMI 34.1  Tricia Miranda is currently in the action stage of change. As such, her goal is to continue with weight loss efforts. She has agreed to the Category 2 Plan.   Exercise goals:  For substantial health benefits, adults should do at least 150 minutes (2 hours and 30 minutes) a week of moderate-intensity, or 75 minutes (1 hour and 15 minutes) a week of vigorous-intensity aerobic physical activity, or an equivalent combination of moderate- and vigorous-intensity aerobic activity. Aerobic activity should be performed in episodes of at least 10 minutes, and preferably, it should be spread throughout the week.  Behavioral modification strategies: increasing lean protein intake, decreasing simple carbohydrates, increasing vegetables, increasing water intake,  meal planning and cooking strategies, keeping healthy foods in the home, ways to avoid boredom eating, travel eating strategies, holiday eating strategies , and planning for success.  Tricia Miranda has agreed to follow-up with our clinic in 4 weeks. She was informed of the importance of frequent follow-up visits to maximize her success with intensive lifestyle modifications for her multiple health conditions.   Objective:   Blood pressure 119/78, pulse 66, temperature 98.3 F (36.8 C), height 5\' 7"  (1.702 m), weight 218 lb (98.9 kg), last menstrual period 11/15/2018, SpO2 97%. Body mass index is 34.14 kg/m.  General: Cooperative, alert, well developed, in no acute distress. HEENT: Conjunctivae and lids unremarkable. Cardiovascular: Regular rhythm.  Lungs: Normal work of breathing. Neurologic: No focal deficits.   Lab Results  Component Value Date   CREATININE 0.58 02/22/2023   BUN 17 02/22/2023   NA 140 02/22/2023   K 4.9 02/22/2023   CL 104 02/22/2023   CO2 28 02/22/2023   Lab Results  Component Value Date   ALT 19 02/22/2023   AST 15 02/22/2023   ALKPHOS 98 11/16/2022   BILITOT 0.5 02/22/2023   Lab Results  Component Value Date   HGBA1C 5.2 02/22/2023   HGBA1C 5.2 11/16/2022   HGBA1C 5.3 05/07/2022   HGBA1C 5.1 05/20/2021   HGBA1C 5.2 05/28/2020   Lab Results  Component Value Date   INSULIN 16.4 11/16/2022   INSULIN 13.1 05/07/2022   INSULIN 9.2 05/20/2021   INSULIN 6.9 06/18/2020   Lab Results  Component Value Date   TSH 2.04 02/22/2023   Lab Results  Component Value Date   CHOL 192 02/22/2023   HDL 75 02/22/2023   LDLCALC 100 (H) 02/22/2023   TRIG 82 02/22/2023   CHOLHDL 2.6 02/22/2023   Lab Results  Component Value Date   VD25OH 40.1 03/04/2023   VD25OH 43.9 11/16/2022   VD25OH 36.6 05/07/2022   Lab Results  Component Value Date   WBC 6.0 02/22/2023   HGB 13.9 02/22/2023   HCT 42.6 02/22/2023   MCV 86.8 02/22/2023   PLT 280 02/22/2023   Lab  Results  Component Value Date   IRON 47 01/15/2017   TIBC 448 01/15/2017   FERRITIN 122 06/18/2020    Attestation Statements:   Reviewed by clinician on day of visit: allergies, medications, problem list, medical history, surgical history, family history, social history, and previous encounter notes.  Time spent on visit including pre-visit chart review and post-visit care and charting was 26 minutes.   I have reviewed the above documentation for accuracy and completeness, and I agree with the above. -  Reha Martinovich d. Asako Saliba, NP-C

## 2023-04-05 NOTE — Therapy (Signed)
OUTPATIENT PHYSICAL THERAPY FEMALE PELVIC TREATMENT   Patient Name: Margeart Varnell MRN: [id number] DOB:07-02-1965, 57 y.o., female Today's Date: 04/05/2023  END OF SESSION:  PT End of Session - 04/05/23 1557     Visit Number 2    Date for PT Re-Evaluation 06/24/23    PT Start Time 1530    PT Stop Time 1615    PT Time Calculation (min) 45 min    Activity Tolerance Patient tolerated treatment well    Behavior During Therapy WFL for tasks assessed/performed              Past Medical History:  Diagnosis Date   Anxiety    Anxiety    Bronchitis    Cancer (HCC)    basal cell- tiny   Cancer (HCC)    Fibroids    uterine    High cholesterol    Hypothyroidism    Joint pain    Low iron    Low vitamin D level    Osteoarthritis    PONV (postoperative nausea and vomiting)    Sleep apnea    uses oral device for mild sleep apnea    Thyroid disease    hypothyroidism   Vitamin B deficiency    Past Surgical History:  Procedure Laterality Date   ABDOMINAL HYSTERECTOMY     APPENDECTOMY  06/2022   COLONOSCOPY     with benign  polyp removal    DILITATION & CURRETTAGE/HYSTROSCOPY WITH VERSAPOINT RESECTION N/A 03/16/2014   Procedure: DILATATION & CURETTAGE/HYSTEROSCOPY WITH VERSAPOINT RESECTION;  Surgeon: Genia Del, MD;  Location: [facility];  Service: Gynecology;  Laterality: N/A;  1 hr.   HYSTERECTOMY ABDOMINAL WITH SALPINGECTOMY Bilateral 11/15/2018   Procedure: HYSTERECTOMY ABDOMINAL WITH  BILATERAL SALPINGECTOMY;  Surgeon: Genia Del, MD;  Location: [facility];  Service: Gynecology;  Laterality: Bilateral;   LAPAROSCOPIC APPENDECTOMY N/A 07/08/2022   Procedure: APPENDECTOMY LAPAROSCOPIC;  Surgeon: Emelia Loron, MD;  Location[facility];  Service: General;  Laterality: N/A;   MOHS SURGERY Left a while- unknown   under left eyelid   WISDOM TOOTH EXTRACTION     Patient Active Problem List   Diagnosis Date Noted   S/P laparoscopic appendectomy  07/08/2022   GAD (generalized anxiety disorder) 02/23/2022   Vitamin B12 deficiency 02/23/2022   Upset stomach 10/16/2021   Other hyperlipidemia 10/16/2021   Class 2 severe obesity with serious comorbidity and body mass index (BMI) of 35.0 to 35.9 in adult [facility]) 10/16/2021   Vitamin D deficiency 08/12/2020   Family history of colonic polyps 05/31/2020   Irritable bowel syndrome 05/31/2020   Postoperative state 11/15/2018   Post-operative state 11/15/2018   Snoring 11/23/2017   Diarrhea 06/23/2012   History of vitamin D deficiency 11/14/2011   Essential tremor 11/14/2011   Hypothyroidism 10/09/2010   Anxiety 10/09/2010   Migraine 10/09/2010    PCP: Margaree Mackintosh, MD   REFERRING PROVIDER:  Patton Salles, MD   REFERRING DIAG: N39.46 (ICD-10-CM) - Mixed incontinence   THERAPY DIAG:  Muscle weakness (generalized)  Other muscle spasm  Rationale for Evaluation and Treatment: Rehabilitation  ONSET DATE: fall of 2020 after hysterectomy  SUBJECTIVE:  SUBJECTIVE STATEMENT: The area in my back is not hurting anymore.   Fluid intake: Yes: enormous amount of water    PAIN:  Are you having pain? Yes NPRS scale: 3/10 Pain location:  right glute   Pain type: aching Pain description: intermittent   Aggravating factors: walking, pushing off wall  Relieving factors: avoiding aggravating it, rolfing, cupping, happy baby pose  PRECAUTIONS: None  RED FLAGS: None   WEIGHT BEARING RESTRICTIONS: No  FALLS:  Has patient fallen in last 6 months? No  LIVING ENVIRONMENT: Lives with: lives alone Lives in: House/apartment Stairs: No Has following equipment at home: None  OCCUPATION: attorney, desk job  PLOF: Independent  PATIENT GOALS: get an answer for this thing is that is her  nuisance  PERTINENT HISTORY:  Hysterectomy 2020 Uterine fibroids removal 2017 Appendectomy 2024  Sexual abuse: No- maybe in childhood  BOWEL MOVEMENT: Pain with bowel movement: Yes but very rarely, IBS Type of bowel movement:Type (Bristol Stool Scale) 4-7 Fully empty rectum: No not always Leakage: No Pads: No Fiber supplement: Yes: citrucel  URINATION: Pain with urination: No Fully empty bladder: Yes: sometimes, when she really has to go Stream: Strong and Weak Urgency: sometimes Frequency: yes Leakage: Urge to void, Walking to the bathroom, Coughing, Sneezing, and Exercise Pads: Yes: 1 or more when she has a cold- pantyliners  INTERCOURSE: N/A PREGNANCY: Vaginal deliveries 1 Tearing Yes:     PROLAPSE: None   OBJECTIVE:  Note: Objective measures were completed at Evaluation unless otherwise noted.   COGNITION: Overall cognitive status: Within functional limits for tasks assessed     SENSATION: Light touch: Appears intact Proprioception: Appears intact  MUSCLE LENGTH: Hamstrings: Right 90 deg; Left 90 deg  LUMBAR SPECIAL TESTS:  Stork standing: Positive for weakness- dips on right side with SLS    GAIT: Comments: antalgic  POSTURE: forward head and increased lumbar lordosis  PELVIC ALIGNMENT: seems even  LUMBARAROM/PROM: hypermobile throughout  LOWER EXTREMITY ROM: increased flexibility throughout  LOWER EXTREMITY MMT: right lower extremity weak throughout  PALPATION:   General  very tight and tender abdominal  scars TPs throughout right glute medially and glute med insertion                External Perineal Exam within functional limitations                              Internal Pelvic Floor no tenderness, able to contract, relax, bulge with good coordination  Patient confirms identification and approves PT to assess internal pelvic floor and treatment Yes  PELVIC MMT:   MMT eval  Vaginal 3/5  Internal Anal Sphincter   External Anal  Sphincter   Puborectalis   Diastasis Recti no  (Blank rows = not tested)        TONE: Seems within functional limitations   PROLAPSE: no  TODAY'S TREATMENT:  DATE: 04/01/2023  EVAL see below     Neuro reed- horizontal abduction with theraband Bridging with transverse abdominis breath  Rowing with transverse abdominis breath  Shoulder extensions with transverse abdominis breath  Cough with pelvic floor lift and transverse abdominis breath Ball press with transverse abdominis breath  Yoga ball pelvic floor lift with transverse abdominis breath  Quadruped transverse abdominis breath with pelvic floor lift   HOME EXERCISE PROGRAM: [id number]  ASSESSMENT:  CLINICAL IMPRESSION: Pt with some difficulty with coordination with her exercises, tends to grip abdomen and req verbal and TC's for more optimal breathing strategies.  Decreased pelvic floor awareness. She will benefit from PT to reduce pain and leaking and improve QOL.     OBJECTIVE IMPAIRMENTS: Abnormal gait, decreased activity tolerance, difficulty walking, decreased strength, increased fascial restrictions, increased muscle spasms, and pain.   ACTIVITY LIMITATIONS: continence and toileting  PARTICIPATION LIMITATIONS: community activity and yard work  PERSONAL FACTORS: Time since onset of injury/illness/exacerbation are also affecting patient's functional outcome.   REHAB POTENTIAL: Good  CLINICAL DECISION MAKING: Stable/uncomplicated  EVALUATION COMPLEXITY: Low   GOALS: Goals reviewed with patient? Yes  SHORT TERM GOALS: Target date: 04/29/2023    Pt will be I with urge drill Baseline: Goal status: INITIAL  2.  Pt will have reduced right glute and sacral pain to 3/10 with 20 min walk Baseline:  Goal status: INITIAL  3.  Pt will dem improved bilateral right hip strength to at least  4/5 Baseline:  Goal status: INITIAL   LONG TERM GOALS: Target date: 06/24/2023     Pt will soak 0 pads/ day Baseline:  Goal status: INITIAL  2.  Pt will report 0/10 pain in right glute with 30 mins of walking or exercise             Baseline:  Goal status: INITIAL  3.  Pt will report 0/10 abdominal scar pain Baseline:  Goal status: INITIAL  4.  Pt will be I with advanced HEP correctly and consistently Baseline:  Goal status: INITIAL   PLAN:  PT FREQUENCY: 1-2x/week  PT DURATION: 12 weeks  PLANNED INTERVENTIONS: 97110-Therapeutic exercises, 97530- Therapeutic activity, 97112- Neuromuscular re-education, 97535- Self Care, 16109- Manual therapy, [id number]- Electrical stimulation (manual), Dry Needling, Joint mobilization, Joint manipulation, Spinal manipulation, Spinal mobilization, Scar mobilization, and Biofeedback  PLAN FOR NEXT SESSION: urge drill, pelvic floor exercises  Osiris Odriscoll, PT 04/05/23 4:02 PM

## 2023-04-07 ENCOUNTER — Other Ambulatory Visit (HOSPITAL_COMMUNITY)
Admission: RE | Admit: 2023-04-07 | Discharge: 2023-04-07 | Disposition: A | Discharge status: Home / Self Care | Payer: No Typology Code available for payment source | Source: Ambulatory Visit | Attending: Oncology | Admitting: Oncology

## 2023-04-07 DIAGNOSIS — Z006 Encounter for examination for normal comparison and control in clinical research program: Secondary | ICD-10-CM

## 2023-04-16 ENCOUNTER — Other Ambulatory Visit: Payer: Self-pay

## 2023-04-16 MED ORDER — MELOXICAM 15 MG PO TABS: 15.0000 mg | ORAL_TABLET | Freq: Every day | ORAL | 0 refills | Status: DC

## 2023-04-19 LAB — GENECONNECT MOLECULAR SCREEN: Genetic Analysis Overall Interpretation: NEGATIVE

## 2023-04-19 NOTE — Progress Notes (Signed)
 GYNECOLOGY  VISIT   HPI: 57 y.o.   Divorced  Caucasian female   G1P1001 with Patient's last menstrual period was 11/15/2018.   here for: 3 mo f/u. Did note right lower quadrant pain a few weeks ago that has since gone away.  Pain lasted one week.   Hx appendectomy.    Taking Vivelle  Dot 0.0375 twice weekly for hot flashes. Has more energy. Joint pain has improved.  Has gained weight since starting the ERT. 6 pound gain. Possible water retention.   Is going to [facility].   GYNECOLOGIC HISTORY: Patient's last menstrual period was 11/15/2018. Contraception:  hyst Menopausal hormone therapy:  estradiol  patch Last 2 paps:  11/2017 History of abnormal Pap or positive HPV:  no Mammogram:  12/23/22 Breast density Cat C, BI-RADS CAT 1 neg         OB History     Gravida  1   Para  1   Term  1   Preterm      AB      Living  1      SAB      IAB      Ectopic      Multiple      Live Births                 Patient Active Problem List   Diagnosis Date Noted   S/P laparoscopic appendectomy 07/08/2022   GAD (generalized anxiety disorder) 02/23/2022   Vitamin B12 deficiency 02/23/2022   Upset stomach 10/16/2021   Other hyperlipidemia 10/16/2021   Class 2 severe obesity with serious comorbidity and body mass index (BMI) of 35.0 to 35.9 in adult [facility]) 10/16/2021   Vitamin D  deficiency 08/12/2020   Family history of colonic polyps 05/31/2020   Irritable bowel syndrome 05/31/2020   Postoperative state 11/15/2018   Post-operative state 11/15/2018   Snoring 11/23/2017   Diarrhea 06/23/2012   History of vitamin D  deficiency 11/14/2011   Essential tremor 11/14/2011   Hypothyroidism 10/09/2010   Anxiety 10/09/2010   Migraine 10/09/2010    Past Medical History:  Diagnosis Date   Anxiety    Anxiety    Bronchitis    Cancer (HCC)    basal cell- tiny   Cancer (HCC)    Fibroids    uterine    High cholesterol    Hypothyroidism    Joint  pain    Low iron    Low vitamin D  level    Osteoarthritis    PONV (postoperative nausea and vomiting)    Sleep apnea    uses oral device for mild sleep apnea    Thyroid disease    hypothyroidism   Vitamin B deficiency     Past Surgical History:  Procedure Laterality Date   ABDOMINAL HYSTERECTOMY     APPENDECTOMY  06/2022   COLONOSCOPY     with benign  polyp removal    DILITATION & CURRETTAGE/HYSTROSCOPY WITH VERSAPOINT RESECTION N/A 03/16/2014   Procedure: DILATATION & CURETTAGE/HYSTEROSCOPY WITH VERSAPOINT RESECTION;  Surgeon: Marie-Lyne Lavoie, MD;  Location: [facility];  Service: Gynecology;  Laterality: N/A;  1 hr.   HYSTERECTOMY ABDOMINAL WITH SALPINGECTOMY Bilateral 11/15/2018   Procedure: HYSTERECTOMY ABDOMINAL WITH  BILATERAL SALPINGECTOMY;  Surgeon: Lavoie, Marie-Lyne, MD;  Location: [facility];  Service: Gynecology;  Laterality: Bilateral;   LAPAROSCOPIC APPENDECTOMY N/A 07/08/2022   Procedure: APPENDECTOMY LAPAROSCOPIC;  Surgeon: Ebbie Cough, MD;  Location: [facility] OR;  Service: General;  Laterality: N/A;  MOHS SURGERY Left a while- unknown   under left eyelid   WISDOM TOOTH EXTRACTION      Current Outpatient Medications  Medication Sig Dispense Refill   ALPRAZolam  (XANAX ) 0.25 MG tablet TAKE ONE TABLET BY MOUTH TWICE A DAY AS NEEDED 60 tablet 3   cyanocobalamin 1000 MCG tablet Take 1,000 mcg by mouth daily.     ergocalciferol  (DRISDOL ) 1.25 MG (50000 UT) capsule Take 1 capsule (50,000 Units total) by mouth once a week. 4 capsule 0   estradiol  (VIVELLE -DOT) 0.0375 MG/24HR Place 1 patch onto the skin 2 (two) times a week. 28 patch 11   ezetimibe  (ZETIA ) 10 MG tablet Take 1 tablet (10 mg total) by mouth daily. 90 tablet 3   FLUoxetine  (PROZAC ) 20 MG capsule Take 20 mg by mouth daily.     meloxicam  (MOBIC ) 15 MG tablet Take 1 tablet (15 mg total) by mouth daily. 90 tablet 0   methylcellulose (CITRUCEL) oral powder Take 1 packet by mouth daily. (Patient  taking differently: Take 1 packet by mouth as needed (Fiber).)     ondansetron  (ZOFRAN ) 4 MG tablet Take 1 tablet (4 mg total) by mouth every 8 (eight) hours as needed for nausea or vomiting. 20 tablet 0   SYNTHROID  100 MCG tablet Take 1 tablet (100 mcg total) by mouth daily before breakfast. TAKE ONE TABLET BY MOUTH DAILY BEFORE A MEAL 90 tablet 1   pravastatin  (PRAVACHOL ) 40 MG tablet Take 1 tablet (40 mg total) by mouth every evening. 90 tablet 3   No current facility-administered medications for this visit.     ALLERGIES: Cat hair extract  Family History  Problem Relation Age of Onset   Hypertension Mother    High Cholesterol Mother    Sleep apnea Mother    Heart disease Father    Alcohol abuse Father    Anxiety disorder Father    Stomach cancer Other    Colon cancer Neg Hx    Esophageal cancer Neg Hx    Pancreatic cancer Neg Hx    Prostate cancer Neg Hx    Rectal cancer Neg Hx     Social History   Socioeconomic History   Marital status: Divorced    Spouse name: Not on file   Number of children: Not on file   Years of education: Not on file   Highest education level: Not on file  Occupational History   Occupation: Attorney/Supervisor  Tobacco Use   Smoking status: Never   Smokeless tobacco: Never  Vaping Use   Vaping status: Never Used  Substance and Sexual Activity   Alcohol use: Yes    Comment: socially   Drug use: No   Sexual activity: Not Currently    Partners: Male    Birth control/protection: Surgical    Comment: 1st intercourse- 15, partners- more than 5, hysterectomy  Other Topics Concern   Not on file  Social History Narrative   Social history: 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  She is married to an attorney who teaches at [employer] [address].  1 son.  Does not smoke.  Does not get a lot of exercise.       Family history: Father with history of MI and alcoholism.  Mother in good health.  1 sister with history of hip  dysplasia.  Maternal grandmother with history of adult onset diabetes.       Social Drivers of Corporate Investment Banker Strain: Not on  file  Food Insecurity: No Food Insecurity (07/08/2022)   Hunger Vital Sign    Worried About Running Out of Food in the Last Year: Never true    Ran Out of Food in the Last Year: Never true  Transportation Needs: No Transportation Needs (07/08/2022)   PRAPARE - Administrator, Civil Service (Medical): No    Lack of Transportation (Non-Medical): No  Physical Activity: Not on file  Stress: Not on file  Social Connections: Not on file  Intimate Partner Violence: Not At Risk (07/08/2022)   Humiliation, Afraid, Rape, and Kick questionnaire    Fear of Current or Ex-Partner: No    Emotionally Abused: No    Physically Abused: No    Sexually Abused: No    Review of Systems  All other systems reviewed and are negative.   PHYSICAL EXAMINATION:   BP 130/84 (BP Location: Left Arm, Patient Position: Sitting, Cuff Size: Large)   Pulse 66   Ht 5' 6.5 (1.689 m)   Wt 222 lb (100.7 kg)   LMP 11/15/2018   SpO2 98%   BMI 35.29 kg/m     General appearance: alert, cooperative and appears stated age  ASSESSMENT:  Estrogen therapy.  Weight gain with current dosage.  RLQ pain, resolved.  Uncertain etiology.  Possible adhesive disease.   PLAN:  Will reduce Vivelle  Dot to 0.025 mg twice weekly.  Disp:  24, RF 2.  She will let me know if the lower dosage is not effective for her.  Return for recurrent pain.  Follow up for annual exam in October.   25 min  total time was spent for this patient encounter, including preparation, face-to-face counseling with the patient, coordination of care, and documentation of the encounter.

## 2023-04-23 ENCOUNTER — Telehealth: Payer: Self-pay

## 2023-04-23 NOTE — Telephone Encounter (Signed)
VOB submitted for Durolane, bilateral knee  

## 2023-04-26 ENCOUNTER — Telehealth: Payer: Self-pay | Admitting: Physical Therapy

## 2023-04-26 ENCOUNTER — Ambulatory Visit
Payer: No Typology Code available for payment source | Attending: Obstetrics and Gynecology | Admitting: Physical Therapy

## 2023-04-26 DIAGNOSIS — M6281 Muscle weakness (generalized): Secondary | ICD-10-CM | POA: Insufficient documentation

## 2023-04-26 DIAGNOSIS — M62838 Other muscle spasm: Secondary | ICD-10-CM | POA: Insufficient documentation

## 2023-04-26 NOTE — Telephone Encounter (Signed)
 Called pt re her missed appt. She forgot, will be back next week.

## 2023-04-28 ENCOUNTER — Encounter: Payer: Self-pay | Admitting: Orthopedic Surgery

## 2023-04-28 ENCOUNTER — Ambulatory Visit (INDEPENDENT_AMBULATORY_CARE_PROVIDER_SITE_OTHER): Payer: No Typology Code available for payment source | Admitting: Surgical

## 2023-04-28 ENCOUNTER — Telehealth: Payer: Self-pay

## 2023-04-28 DIAGNOSIS — M1712 Unilateral primary osteoarthritis, left knee: Secondary | ICD-10-CM

## 2023-04-28 DIAGNOSIS — M170 Bilateral primary osteoarthritis of knee: Secondary | ICD-10-CM | POA: Diagnosis not present

## 2023-04-28 DIAGNOSIS — M1711 Unilateral primary osteoarthritis, right knee: Secondary | ICD-10-CM

## 2023-04-28 MED ORDER — LIDOCAINE HCL 1 % IJ SOLN
5.0000 mL | INTRAMUSCULAR | Status: AC | PRN
  Administered 2023-04-28: 5 mL

## 2023-04-28 MED ORDER — SODIUM HYALURONATE 60 MG/3ML IX PRSY
60.0000 mg | PREFILLED_SYRINGE | INTRA_ARTICULAR | Status: AC | PRN
  Administered 2023-04-28: 60 mg via INTRA_ARTICULAR

## 2023-04-28 NOTE — Progress Notes (Signed)
   Procedure Note  Patient: Tricia Miranda             Date of Birth: 08-04-1965           MRN: [id number]             Visit Date: 04/28/2023  Procedures: Visit Diagnoses:  1. Unilateral primary osteoarthritis, right knee   2. Unilateral primary osteoarthritis, left knee     Large Joint Inj: bilateral knee on 04/28/2023 10:27 AM Indications: diagnostic evaluation, joint swelling and pain Details: 18 G 1.5 in needle, superolateral approach  Arthrogram: No  Medications (Right): 5 mL lidocaine  1 %; 60 mg Sodium Hyaluronate 60 MG/3ML Medications (Left): 5 mL lidocaine  1 %; 60 mg Sodium Hyaluronate 60 MG/3ML Outcome: tolerated well, no immediate complications  Patient continues to have excellent relief of 4-6 months from gel injections for each knee.  Plan to preapprove for bilateral knee gel inj's again in 6 months  This patient is diagnosed with osteoarthritis of the knee(s).    Radiographs show evidence of joint space narrowing, osteophytes, subchondral sclerosis and/or subchondral cysts.  This patient has knee pain which interferes with functional and activities of daily living.    This patient has experienced inadequate response, adverse effects and/or intolerance with conservative treatments such as acetaminophen , NSAIDS, topical creams, physical therapy or regular exercise, knee bracing and/or weight loss.   This patient has experienced inadequate response or has a contraindication to intra articular steroid injections for at least 3 months.   This patient is not scheduled to have a total knee replacement within 6 months of starting treatment with viscosupplementation.   Procedure, treatment alternatives, risks and benefits explained, specific risks discussed. Consent was given by the patient. Immediately prior to procedure a time out was called to verify the correct patient, procedure, equipment, support staff and site/side marked as required. Patient was prepped and draped in  the usual sterile fashion.

## 2023-04-28 NOTE — Telephone Encounter (Signed)
 Noted.  Next available gel injection needs to be after 10/26/2023 Will submit in June 2025.

## 2023-04-28 NOTE — Telephone Encounter (Signed)
Auth needed for bilat knee gel in 6 months

## 2023-05-03 ENCOUNTER — Ambulatory Visit: Payer: No Typology Code available for payment source | Admitting: Obstetrics and Gynecology

## 2023-05-03 ENCOUNTER — Other Ambulatory Visit: Payer: Self-pay

## 2023-05-03 ENCOUNTER — Encounter: Payer: Self-pay | Admitting: Obstetrics and Gynecology

## 2023-05-03 VITALS — BP 130/84 | HR 66 | Ht 66.5 in | Wt 222.0 lb

## 2023-05-03 DIAGNOSIS — Z5181 Encounter for therapeutic drug level monitoring: Secondary | ICD-10-CM

## 2023-05-03 DIAGNOSIS — Z79890 Hormone replacement therapy (postmenopausal): Secondary | ICD-10-CM | POA: Diagnosis not present

## 2023-05-03 DIAGNOSIS — M1711 Unilateral primary osteoarthritis, right knee: Secondary | ICD-10-CM

## 2023-05-03 DIAGNOSIS — Z79899 Other long term (current) drug therapy: Secondary | ICD-10-CM

## 2023-05-03 DIAGNOSIS — M1712 Unilateral primary osteoarthritis, left knee: Secondary | ICD-10-CM

## 2023-05-03 MED ORDER — ESTRADIOL 0.025 MG/24HR TD PTTW: 1.0000 | MEDICATED_PATCH | TRANSDERMAL | 2 refills | Status: DC

## 2023-05-04 ENCOUNTER — Other Ambulatory Visit (INDEPENDENT_AMBULATORY_CARE_PROVIDER_SITE_OTHER): Payer: Self-pay | Admitting: Adult Health

## 2023-05-04 ENCOUNTER — Encounter: Payer: Self-pay | Admitting: Physical Therapy

## 2023-05-04 ENCOUNTER — Ambulatory Visit: Payer: No Typology Code available for payment source | Admitting: Physical Therapy

## 2023-05-04 DIAGNOSIS — M62838 Other muscle spasm: Secondary | ICD-10-CM

## 2023-05-04 DIAGNOSIS — M6281 Muscle weakness (generalized): Secondary | ICD-10-CM

## 2023-05-04 DIAGNOSIS — E559 Vitamin D deficiency, unspecified: Secondary | ICD-10-CM

## 2023-05-04 NOTE — Therapy (Addendum)
 OUTPATIENT PHYSICAL THERAPY FEMALE PELVIC TREATMENT/ later day discharge   Patient Name: Nyhla Miranda MRN: [id number] DOB:12-31-1965, 58 y.o., female Today's Date: 05/04/2023  END OF SESSION:  PT End of Session - 05/04/23 1609     Visit Number 3    Date for PT Re-Evaluation 06/24/23    PT Start Time 1530    PT Stop Time 1615    PT Time Calculation (min) 45 min    Activity Tolerance Patient tolerated treatment well    Behavior During Therapy WFL for tasks assessed/performed               Past Medical History:  Diagnosis Date   Anxiety    Anxiety    Bronchitis    Cancer (HCC)    basal cell- tiny   Cancer (HCC)    Fibroids    uterine    High cholesterol    Hypothyroidism    Joint pain    Low iron    Low vitamin D  level    Osteoarthritis    PONV (postoperative nausea and vomiting)    Sleep apnea    uses oral device for mild sleep apnea    Thyroid disease    hypothyroidism   Vitamin B deficiency    Past Surgical History:  Procedure Laterality Date   ABDOMINAL HYSTERECTOMY     APPENDECTOMY  06/2022   COLONOSCOPY     with benign  polyp removal    DILITATION & CURRETTAGE/HYSTROSCOPY WITH VERSAPOINT RESECTION N/A 03/16/2014   Procedure: DILATATION & CURETTAGE/HYSTEROSCOPY WITH VERSAPOINT RESECTION;  Surgeon: Marie-Lyne Lavoie, MD;  Location: [facility];  Service: Gynecology;  Laterality: N/A;  1 hr.   HYSTERECTOMY ABDOMINAL WITH SALPINGECTOMY Bilateral 11/15/2018   Procedure: HYSTERECTOMY ABDOMINAL WITH  BILATERAL SALPINGECTOMY;  Surgeon: Lavoie, Marie-Lyne, MD;  Location: [facility];  Service: Gynecology;  Laterality: Bilateral;   LAPAROSCOPIC APPENDECTOMY N/A 07/08/2022   Procedure: APPENDECTOMY LAPAROSCOPIC;  Surgeon: Ebbie Cough, MD;  Location: [facility] OR;  Service: General;  Laterality: N/A;   MOHS SURGERY Left a while- unknown   under left eyelid   WISDOM TOOTH EXTRACTION     Patient Active Problem List   Diagnosis Date Noted   S/P  laparoscopic appendectomy 07/08/2022   GAD (generalized anxiety disorder) 02/23/2022   Vitamin B12 deficiency 02/23/2022   Upset stomach 10/16/2021   Other hyperlipidemia 10/16/2021   Class 2 severe obesity with serious comorbidity and body mass index (BMI) of 35.0 to 35.9 in adult [facility]) 10/16/2021   Vitamin D  deficiency 08/12/2020   Family history of colonic polyps 05/31/2020   Irritable bowel syndrome 05/31/2020   Postoperative state 11/15/2018   Post-operative state 11/15/2018   Snoring 11/23/2017   Diarrhea 06/23/2012   History of vitamin D  deficiency 11/14/2011   Essential tremor 11/14/2011   Hypothyroidism 10/09/2010   Anxiety 10/09/2010   Migraine 10/09/2010    PCP: Perri Ronal PARAS, MD   REFERRING PROVIDER:  Cathlyn JAYSON Nikki Bobie FORBES, MD   REFERRING DIAG: [phone] (ICD-10-CM) - Mixed incontinence   THERAPY DIAG:  Muscle weakness (generalized)  Other muscle spasm  Rationale for Evaluation and Treatment: Rehabilitation  ONSET DATE: fall of 2020 after hysterectomy  SUBJECTIVE:  SUBJECTIVE STATEMENT: Pt reports that she is doing well.  Back pain has been minimal Has had injections in her knees- hyaluronic acid, maybe aggravated her back a little Urgency has been better Abdominal scar pain has been better, doing cupping   Fluid intake: Yes: enormous amount of water    PAIN:  Are you having pain? Yes NPRS scale: 3/10 Pain location:  right glute   Pain type: aching Pain description: intermittent   Aggravating factors: walking, pushing off wall  Relieving factors: avoiding aggravating it, rolfing, cupping, happy baby pose  PRECAUTIONS: None  RED FLAGS: None   WEIGHT BEARING RESTRICTIONS: No  FALLS:  Has patient fallen in last 6 months? No  LIVING ENVIRONMENT: Lives  with: lives alone Lives in: House/apartment Stairs: No Has following equipment at home: None  OCCUPATION: attorney, desk job  PLOF: Independent  PATIENT GOALS: get an answer for this thing is that is her nuisance  PERTINENT HISTORY:  Hysterectomy 2020 Uterine fibroids removal 2017 Appendectomy 2024  Sexual abuse: No- maybe in childhood  BOWEL MOVEMENT: Pain with bowel movement: Yes but very rarely, IBS Type of bowel movement:Type (Bristol Stool Scale) 4-7 Fully empty rectum: No not always Leakage: No Pads: No Fiber supplement: Yes: citrucel  URINATION: Pain with urination: No Fully empty bladder: Yes: sometimes, when she really has to go Stream: Strong and Weak Urgency: sometimes Frequency: yes Leakage: Urge to void, Walking to the bathroom, Coughing, Sneezing, and Exercise Pads: Yes: 1 or more when she has a cold- pantyliners  INTERCOURSE: N/A PREGNANCY: Vaginal deliveries 1 Tearing Yes:     PROLAPSE: None   OBJECTIVE:  Note: Objective measures were completed at Evaluation unless otherwise noted.   COGNITION: Overall cognitive status: Within functional limits for tasks assessed     SENSATION: Light touch: Appears intact Proprioception: Appears intact  MUSCLE LENGTH: Hamstrings: Right 90 deg; Left 90 deg  LUMBAR SPECIAL TESTS:  Stork standing: Positive for weakness- dips on right side with SLS    GAIT: Comments: antalgic  POSTURE: forward head and increased lumbar lordosis  PELVIC ALIGNMENT: seems even  LUMBARAROM/PROM: hypermobile throughout  LOWER EXTREMITY ROM: increased flexibility throughout  LOWER EXTREMITY MMT: right lower extremity weak throughout  PALPATION:   General  very tight and tender abdominal  scars TPs throughout right glute medially and glute med insertion                External Perineal Exam within functional limitations                              Internal Pelvic Floor no tenderness, able to contract, relax,  bulge with good coordination  Patient confirms identification and approves PT to assess internal pelvic floor and treatment Yes  PELVIC MMT:   MMT eval  Vaginal 3/5  Internal Anal Sphincter   External Anal Sphincter   Puborectalis   Diastasis Recti no  (Blank rows = not tested)        TONE: Seems within functional limitations   PROLAPSE: no  TODAY'S TREATMENT:  DATE: 05/04/23  EVAL see below     Neuro reed- diaphragmatic breathing reed In quadruped and with thera band     horizontal abduction with theraband Bridging with transverse abdominis breath  Rowing with transverse abdominis breath  Shoulder extensions with transverse abdominis breath with blue thera band  Cough with pelvic floor lift and transverse abdominis breath Ball press with transverse abdominis breath  Yoga ball pelvic floor lift with transverse abdominis breath  Quadruped transverse abdominis breath with pelvic floor lift  HOME EXERCISE PROGRAM: [id number]  ASSESSMENT:  CLINICAL IMPRESSION: Pt with some difficulty with coordination with her exercises, tends to grip abdomen and req verbal and TC's for more optimal breathing strategies.  Had some bilateral knee pain today. She will benefit from PT to reduce pain and leaking and improve QOL.     OBJECTIVE IMPAIRMENTS: Abnormal gait, decreased activity tolerance, difficulty walking, decreased strength, increased fascial restrictions, increased muscle spasms, and pain.   ACTIVITY LIMITATIONS: continence and toileting  PARTICIPATION LIMITATIONS: community activity and yard work  PERSONAL FACTORS: Time since onset of injury/illness/exacerbation are also affecting patient's functional outcome.   REHAB POTENTIAL: Good  CLINICAL DECISION MAKING: Stable/uncomplicated  EVALUATION COMPLEXITY: Low   GOALS: Goals reviewed with  patient? Yes  SHORT TERM GOALS: Target date: 04/29/2023    Pt will be I with urge drill Baseline: Goal status: INITIAL  2.  Pt will have reduced right glute and sacral pain to 3/10 with 20 min walk Baseline:  Goal status: INITIAL  3.  Pt will dem improved bilateral right hip strength to at least 4/5 Baseline:  Goal status: INITIAL   LONG TERM GOALS: Target date: 06/24/2023     Pt will soak 0 pads/ day Baseline:  Goal status: INITIAL  2.  Pt will report 0/10 pain in right glute with 30 mins of walking or exercise             Baseline:  Goal status: INITIAL  3.  Pt will report 0/10 abdominal scar pain Baseline:  Goal status: INITIAL  4.  Pt will be I with advanced HEP correctly and consistently Baseline:  Goal status: INITIAL   PLAN:  PT FREQUENCY: 1-2x/week  PT DURATION: 12 weeks  PLANNED INTERVENTIONS: 97110-Therapeutic exercises, 97530- Therapeutic activity, 97112- Neuromuscular re-education, 97535- Self Care, [id number]- Manual therapy, [phone]- Electrical stimulation (manual), Dry Needling, Joint mobilization, Joint manipulation, Spinal manipulation, Spinal mobilization, Scar mobilization, and Biofeedback  PLAN FOR NEXT SESSION: urge drill, pelvic floor exercises  Tricia Miranda, PT 05/04/23 4:11 PM    PHYSICAL THERAPY DISCHARGE SUMMARY   Patient agrees to discharge. Patient goals were not met. Patient is being discharged due to not returning since the last visit.  Tricia Miranda, PT 07/29/23 8:54 AM

## 2023-05-06 ENCOUNTER — Telehealth (INDEPENDENT_AMBULATORY_CARE_PROVIDER_SITE_OTHER): Payer: Self-pay

## 2023-05-06 ENCOUNTER — Encounter (INDEPENDENT_AMBULATORY_CARE_PROVIDER_SITE_OTHER): Payer: Self-pay | Admitting: Adult Health

## 2023-05-06 ENCOUNTER — Ambulatory Visit (INDEPENDENT_AMBULATORY_CARE_PROVIDER_SITE_OTHER): Payer: No Typology Code available for payment source | Admitting: Adult Health

## 2023-05-06 VITALS — BP 114/75 | HR 65 | Temp 98.4°F | Ht 67.0 in | Wt 218.0 lb

## 2023-05-06 DIAGNOSIS — E559 Vitamin D deficiency, unspecified: Secondary | ICD-10-CM | POA: Diagnosis not present

## 2023-05-06 DIAGNOSIS — E88819 Insulin resistance, unspecified: Secondary | ICD-10-CM | POA: Diagnosis not present

## 2023-05-06 DIAGNOSIS — M170 Bilateral primary osteoarthritis of knee: Secondary | ICD-10-CM | POA: Diagnosis not present

## 2023-05-06 DIAGNOSIS — E66812 Obesity, class 2: Secondary | ICD-10-CM

## 2023-05-06 DIAGNOSIS — Z6834 Body mass index (BMI) 34.0-34.9, adult: Secondary | ICD-10-CM

## 2023-05-06 DIAGNOSIS — E669 Obesity, unspecified: Secondary | ICD-10-CM

## 2023-05-06 DIAGNOSIS — Z79890 Hormone replacement therapy (postmenopausal): Secondary | ICD-10-CM

## 2023-05-06 MED ORDER — ERGOCALCIFEROL 1.25 MG (50000 UT) PO CAPS: 50000.0000 [IU] | ORAL_CAPSULE | ORAL | 0 refills | Status: DC

## 2023-05-06 MED ORDER — ZEPBOUND 2.5 MG/0.5ML ~~LOC~~ SOAJ: 2.5000 mg | SUBCUTANEOUS | 0 refills | Status: DC

## 2023-05-06 NOTE — Progress Notes (Signed)
WEIGHT SUMMARY AND BIOMETRICS  Vitals Temp: 98.4 F (36.9 C) BP: 114/75 Pulse Rate: 65 SpO2: 98 %   Anthropometric Measurements Height: 5\' 7"  (1.702 m) Weight: 218 lb (98.9 kg) BMI (Calculated): 34.14 Weight at Last Visit: 218lb Weight Lost Since Last Visit: 0 Weight Gained Since Last Visit: 0 Starting Weight: 224lb Total Weight Loss (lbs): 6 lb (2.722 kg)   Body Composition  Body Fat %: 42 % Fat Mass (lbs): 91.8 lbs Muscle Mass (lbs): 120.4 lbs Total Body Water (lbs): 78.6 lbs Visceral Fat Rating : 11   Other Clinical Data Fasting: no Labs: no Today's Visit #: 24 Starting Date: 06/18/20    Chief Complaint:   OBESITY Tricia Miranda is here to discuss her progress with her obesity treatment plan. She is on the the Category 2 Plan and states she is following her eating plan approximately 80 % of the time.  She states she is exercising: None, R knee pain   Interim History:  04/28/2023  Medications (Right): 5 mL lidocaine 1 %; 60 mg Sodium Hyaluronate 60 MG/3ML Medications (Left): 5 mL lidocaine 1 %; 60 mg Sodium Hyaluronate 60 MG/3ML Of both knees Since the injection she has experienced markedly increased R knee pain- limiting her mobility. She has contacted [facility] for re-evaluation- yet to be seen  She is frustrated with lack of weight loss- despite following the prescribed meal plan at least 80%  Subjective:   1. Insulin Resistance  Latest Reference Range & Units 11/16/22 11:48  INSULIN 2.6 - 24.9 uIU/mL 16.4   She denies family hx of MEN 2 or MTC She denies personal hx of pancreatitis Discussed risks/benefits of Metformin, GLP-1, GIP/GLP-1 therapy She prefers injection therapy She has had hysterectomy  2. Osteoarthritis of both knees, unspecified osteoarthritis type Large Joint Inj: bilateral knee on 04/28/2023 10:27 AM Indications: diagnostic evaluation, joint swelling and pain Details: 18 G 1.5 in needle, superolateral approach   Arthrogram:  No   Medications (Right): 5 mL lidocaine 1 %; 60 mg Sodium Hyaluronate 60 MG/3ML Medications (Left): 5 mL lidocaine 1 %; 60 mg Sodium Hyaluronate 60 MG/3ML Outcome: tolerated well, no immediate complications   Patient continues to have excellent relief of 4-6 months from gel injections for each knee.  Plan to preapprove for bilateral knee gel inj's again in 6 months   This patient is diagnosed with osteoarthritis of the knee(s).     Radiographs show evidence of joint space narrowing, osteophytes, subchondral sclerosis and/or subchondral cysts.  This patient has knee pain which interferes with functional and activities of daily living.     This patient has experienced inadequate response, adverse effects and/or intolerance with conservative treatments such as acetaminophen, NSAIDS, topical creams, physical therapy or regular exercise, knee bracing and/or weight loss.    This patient has experienced inadequate response or has a contraindication to intra articular steroid injections for at least 3 months.    This patient is not scheduled to have a total knee replacement within 6 months of starting treatment with viscosupplementation.  She reports increased pain of R knee since injection  2. Hormone replacement therapy 05/03/2023 OB OV Notes- 58 y.o.   Divorced  Caucasian female   G1P1001 with Patient's last menstrual period was 11/15/2018.   here for: 3 mo f/u. Did note right lower quadrant pain a few weeks ago that has since gone away.  Pain lasted one week.    Hx appendectomy.     Taking Vivelle Dot 0.0375 twice  weekly for hot flashes. Has more energy. Joint pain has improved.   Has gained weight since starting the ERT. 6 pound gain. Possible water retention.   ASSESSMENT: Estrogen therapy.  Weight gain with current dosage.  RLQ pain, resolved.  Uncertain etiology.  Possible adhesive disease.  PLAN: Will reduce Vivelle Dot to 0.025 mg twice weekly.  Disp:  24, RF 2.  She will  let me know if the lower dosage is not effective for her.  Return for recurrent pain.  Follow up for annual exam in October.   3. Vitamin D deficiency  Latest Reference Range & Units 03/04/23 13:05  Vitamin D, 25-Hydroxy 30.0 - 100.0 ng/mL 40.1   She is on weekly Ergocaliferol- denies N/V/Muscle Weakness  Assessment/Plan:   1. Osteoarthritis of both knees, unspecified osteoarthritis type F/u with Orthopedic Specialist as directed/needed  2. Hormone replacement therapy Continue therapy per specialist  reduce Vivelle Dot to 0.025 mg twice weekly.  Disp:  24, RF 2.   3. Vitamin D deficiency Refill - ergocalciferol (DRISDOL) 1.25 MG (50000 UT) capsule; Take 1 capsule (50,000 Units total) by mouth once a week.  Dispense: 4 capsule; Refill: 0  4. Obesity, Current BMI 34.14 Start  tirzepatide (ZEPBOUND) 2.5 MG/0.5ML Pen Inject 2.5 mg into the skin once a week. Dispense: 3 mL, Refills: 0 ordered   Tricia Miranda is currently in the action stage of change. As such, her goal is to continue with weight loss efforts. She has agreed to the Category 2 Plan.   Exercise goals: No exercise has been prescribed at this time.  Behavioral modification strategies: increasing lean protein intake, decreasing simple carbohydrates, increasing vegetables, increasing water intake, meal planning and cooking strategies, keeping healthy foods in the home, ways to avoid boredom eating, and planning for success.  Tricia Miranda has agreed to follow-up with our clinic in 4 weeks. She was informed of the importance of frequent follow-up visits to maximize her success with intensive lifestyle modifications for her multiple health conditions.   Check Fasting Labs Winter 2024  Objective:   Blood pressure 114/75, pulse 65, temperature 98.4 F (36.9 C), height 5\' 7"  (1.702 m), weight 218 lb (98.9 kg), last menstrual period 11/15/2018, SpO2 98%. Body mass index is 34.14 kg/m.  General: Cooperative, alert, well developed, in no  acute distress. HEENT: Conjunctivae and lids unremarkable. Cardiovascular: Regular rhythm.  Lungs: Normal work of breathing. Neurologic: No focal deficits.   Lab Results  Component Value Date   CREATININE 0.58 02/22/2023   BUN 17 02/22/2023   NA 140 02/22/2023   K 4.9 02/22/2023   CL 104 02/22/2023   CO2 28 02/22/2023   Lab Results  Component Value Date   ALT 19 02/22/2023   AST 15 02/22/2023   ALKPHOS 98 11/16/2022   BILITOT 0.5 02/22/2023   Lab Results  Component Value Date   HGBA1C 5.2 02/22/2023   HGBA1C 5.2 11/16/2022   HGBA1C 5.3 05/07/2022   HGBA1C 5.1 05/20/2021   HGBA1C 5.2 05/28/2020   Lab Results  Component Value Date   INSULIN 16.4 11/16/2022   INSULIN 13.1 05/07/2022   INSULIN 9.2 05/20/2021   INSULIN 6.9 06/18/2020   Lab Results  Component Value Date   TSH 2.04 02/22/2023   Lab Results  Component Value Date   CHOL 192 02/22/2023   HDL 75 02/22/2023   LDLCALC 100 (H) 02/22/2023   TRIG 82 02/22/2023   CHOLHDL 2.6 02/22/2023   Lab Results  Component Value Date   VD25OH 40.1  03/04/2023   VD25OH 43.9 11/16/2022   VD25OH 36.6 05/07/2022   Lab Results  Component Value Date   WBC 6.0 02/22/2023   HGB 13.9 02/22/2023   HCT 42.6 02/22/2023   MCV 86.8 02/22/2023   PLT 280 02/22/2023   Lab Results  Component Value Date   IRON 47 01/15/2017   TIBC 448 01/15/2017   FERRITIN 122 06/18/2020   Attestation Statements:   Reviewed by clinician on day of visit: allergies, medications, problem list, medical history, surgical history, family history, social history, and previous encounter notes.  I have reviewed the above documentation for accuracy and completeness, and I agree with the above. -  Duane Trias d. Aly Hauser, NP-C

## 2023-05-06 NOTE — Telephone Encounter (Signed)
PA questions for Zepbound 2.5  have been answered and all documentation has been included. Waiting on a determination.

## 2023-05-06 NOTE — Telephone Encounter (Signed)
PA for Zepbound 2.5 has been submitted, awaiting PA questions.

## 2023-05-06 NOTE — Telephone Encounter (Signed)
PA for Zepbound 2.5 has been approved. PA is now complete.

## 2023-05-10 ENCOUNTER — Ambulatory Visit: Payer: No Typology Code available for payment source | Admitting: Surgical

## 2023-05-10 ENCOUNTER — Encounter: Payer: Self-pay | Admitting: Surgical

## 2023-05-10 DIAGNOSIS — M1711 Unilateral primary osteoarthritis, right knee: Secondary | ICD-10-CM | POA: Diagnosis not present

## 2023-05-10 DIAGNOSIS — M1712 Unilateral primary osteoarthritis, left knee: Secondary | ICD-10-CM | POA: Diagnosis not present

## 2023-05-10 NOTE — Progress Notes (Signed)
Follow-up Office Visit Note   Patient: Tricia Miranda           Date of Birth: 1966-04-04           MRN: [id number] Visit Date: 05/10/2023 Requested by: Margaree Mackintosh, MD [address] [place],  [place] [address] PCP: Margaree Mackintosh, MD  Subjective: Chief Complaint  Patient presents with   Right Knee - Pain    HPI: Tricia Miranda is a 58 y.o. female who returns to the office for follow-up visit.    Plan at last visit was: Bilateral knee gel injection on 04/28/2023  Since then, patient notes pain onset quite severely in the right knee about a day after injection.  She considered going to the emergency department due to pain severity but at this point as of Saturday, pain has substantially improved.  Still having some lingering pain but it is certainly trending in the right direction.  Taking meloxicam daily.  Using ice.  She is considering going back to her rowing class this evening due to how her knee is feeling.  Has never had this reaction before from a gel injection.  No fevers or chills.  No groin pain.  No new injury.  Left knee is feeling great              ROS: All systems reviewed are negative as they relate to the chief complaint within the history of present illness.  Patient denies fevers or chills.  Assessment & Plan: Visit Diagnoses:  1. Unilateral primary osteoarthritis, right knee   2. Unilateral primary osteoarthritis, left knee     Plan: Tricia Miranda is a 58 y.o. female who returns to the office for follow-up visit.  Plan from last visit was noted above in HPI.  They now return with increased pain after right knee gel injection.  Left knee gel injection did great for her left knee and she is having no symptoms in that knee.  We discussed options such as riding this out with her recent substantial improvement versus trying a different type of injection such as Toradol versus cortisone versus PRP.  She is interested in PRP so if this pain does not improve  after 1 to 2 weeks, we will plan to proceed with that in the future.  Otherwise she will follow-up as needed but recommended she send message on MyChart in 1 to 2 weeks to update [place].  Follow-Up Instructions: Return in about 2 weeks (around 05/24/2023), or if symptoms worsen or fail to improve.   Orders:  No orders of the defined types were placed in this encounter.  No orders of the defined types were placed in this encounter.     Procedures: No procedures performed   Clinical Data: No additional findings.  Objective: Vital Signs: LMP 11/15/2018   Physical Exam:  Constitutional: Patient appears well-developed HEENT:  Head: Normocephalic Eyes:EOM are normal Neck: Normal range of motion Cardiovascular: Normal rate Pulmonary/chest: Effort normal Neurologic: Patient is alert Skin: Skin is warm Psychiatric: Patient has normal mood and affect  Ortho Exam: Ortho exam demonstrates right knee without any significant effusion.  No cellulitis.  No ecchymosis around the injection site.  Able to perform straight leg raise with excellent quad strength.  Excellent hamstring strength without reproduction of pain.  She has tenderness over the medial lateral joint line mildly.  Stable to anterior and posterior drawer sign.  Stable to varus and valgus stress at 0 and 30 degrees.  No pain with hip range of motion.  Specialty Comments:  No specialty comments available.  Imaging: No results found.   PMFS History: Patient Active Problem List   Diagnosis Date Noted   S/P laparoscopic appendectomy 07/08/2022   GAD (generalized anxiety disorder) 02/23/2022   Vitamin B12 deficiency 02/23/2022   Upset stomach 10/16/2021   Other hyperlipidemia 10/16/2021   Class 2 severe obesity with serious comorbidity and body mass index (BMI) of 35.0 to 35.9 in adult [facility]) 10/16/2021   Vitamin D deficiency 08/12/2020   Family history of colonic polyps 05/31/2020   Irritable bowel syndrome 05/31/2020    Postoperative state 11/15/2018   Post-operative state 11/15/2018   Snoring 11/23/2017   Diarrhea 06/23/2012   History of vitamin D deficiency 11/14/2011   Essential tremor 11/14/2011   Hypothyroidism 10/09/2010   Anxiety 10/09/2010   Migraine 10/09/2010   Past Medical History:  Diagnosis Date   Anxiety    Anxiety    Bronchitis    Cancer (HCC)    basal cell- tiny   Cancer (HCC)    Fibroids    uterine    High cholesterol    Hypothyroidism    Joint pain    Low iron    Low vitamin D level    Osteoarthritis    PONV (postoperative nausea and vomiting)    Sleep apnea    uses oral device for mild sleep apnea    Thyroid disease    hypothyroidism   Vitamin B deficiency     Family History  Problem Relation Age of Onset   Hypertension Mother    High Cholesterol Mother    Sleep apnea Mother    Heart disease Father    Alcohol abuse Father    Anxiety disorder Father    Stomach cancer Other    Colon cancer Neg Hx    Esophageal cancer Neg Hx    Pancreatic cancer Neg Hx    Prostate cancer Neg Hx    Rectal cancer Neg Hx     Past Surgical History:  Procedure Laterality Date   ABDOMINAL HYSTERECTOMY     APPENDECTOMY  06/2022   COLONOSCOPY     with benign  polyp removal    DILITATION & CURRETTAGE/HYSTROSCOPY WITH VERSAPOINT RESECTION N/A 03/16/2014   Procedure: DILATATION & CURETTAGE/HYSTEROSCOPY WITH VERSAPOINT RESECTION;  Surgeon: Genia Del, MD;  Location: [facility];  Service: Gynecology;  Laterality: N/A;  1 hr.   HYSTERECTOMY ABDOMINAL WITH SALPINGECTOMY Bilateral 11/15/2018   Procedure: HYSTERECTOMY ABDOMINAL WITH  BILATERAL SALPINGECTOMY;  Surgeon: Genia Del, MD;  Location: [facility];  Service: Gynecology;  Laterality: Bilateral;   LAPAROSCOPIC APPENDECTOMY N/A 07/08/2022   Procedure: APPENDECTOMY LAPAROSCOPIC;  Surgeon: Emelia Loron, MD;  Location: [facility] OR;  Service: General;  Laterality: N/A;   MOHS SURGERY Left a while- unknown    under left eyelid   WISDOM TOOTH EXTRACTION     Social History   Occupational History   Occupation: Administrator, arts  Tobacco Use   Smoking status: Never   Smokeless tobacco: Never  Vaping Use   Vaping status: Never Used  Substance and Sexual Activity   Alcohol use: Yes    Comment: socially   Drug use: No   Sexual activity: Not Currently    Partners: Male    Birth control/protection: Surgical    Comment: 1st intercourse- 15, partners- more than 5, hysterectomy

## 2023-05-17 ENCOUNTER — Ambulatory Visit: Payer: Self-pay | Admitting: Surgical

## 2023-05-31 ENCOUNTER — Other Ambulatory Visit: Payer: Self-pay

## 2023-05-31 MED ORDER — ONDANSETRON HCL 4 MG PO TABS: 4.0000 mg | ORAL_TABLET | Freq: Three times a day (TID) | ORAL | 0 refills | Status: AC | PRN

## 2023-06-03 ENCOUNTER — Other Ambulatory Visit (INDEPENDENT_AMBULATORY_CARE_PROVIDER_SITE_OTHER): Payer: Self-pay | Admitting: Adult Health

## 2023-06-03 DIAGNOSIS — E559 Vitamin D deficiency, unspecified: Secondary | ICD-10-CM

## 2023-06-07 ENCOUNTER — Encounter (INDEPENDENT_AMBULATORY_CARE_PROVIDER_SITE_OTHER): Payer: Self-pay | Admitting: Adult Health

## 2023-06-07 ENCOUNTER — Ambulatory Visit (INDEPENDENT_AMBULATORY_CARE_PROVIDER_SITE_OTHER): Payer: No Typology Code available for payment source | Admitting: Adult Health

## 2023-06-07 VITALS — BP 114/74 | HR 62 | Temp 98.9°F | Ht 67.0 in | Wt 216.0 lb

## 2023-06-07 DIAGNOSIS — E669 Obesity, unspecified: Secondary | ICD-10-CM

## 2023-06-07 DIAGNOSIS — E66812 Obesity, class 2: Secondary | ICD-10-CM

## 2023-06-07 DIAGNOSIS — E88819 Insulin resistance, unspecified: Secondary | ICD-10-CM | POA: Diagnosis not present

## 2023-06-07 DIAGNOSIS — E039 Hypothyroidism, unspecified: Secondary | ICD-10-CM

## 2023-06-07 DIAGNOSIS — E782 Mixed hyperlipidemia: Secondary | ICD-10-CM

## 2023-06-07 DIAGNOSIS — E559 Vitamin D deficiency, unspecified: Secondary | ICD-10-CM

## 2023-06-07 DIAGNOSIS — Z6833 Body mass index (BMI) 33.0-33.9, adult: Secondary | ICD-10-CM

## 2023-06-07 MED ORDER — ERGOCALCIFEROL 1.25 MG (50000 UT) PO CAPS
50000.0000 [IU] | ORAL_CAPSULE | ORAL | 0 refills | Status: DC
Start: 2023-06-07 — End: 2023-07-05

## 2023-06-07 MED ORDER — ZEPBOUND 2.5 MG/0.5ML ~~LOC~~ SOAJ: 2.5000 mg | SUBCUTANEOUS | 0 refills | Status: DC

## 2023-06-07 NOTE — Progress Notes (Signed)
 WEIGHT SUMMARY AND BIOMETRICS  Vitals Temp: 98.9 F (37.2 C) BP: 114/74 Pulse Rate: 62 SpO2: 98 %   Anthropometric Measurements Height: 5\' 7"  (1.702 m) Weight: 216 lb (98 kg) BMI (Calculated): 33.82 Weight at Last Visit: 218 lb Weight Lost Since Last Visit: 2 lb Weight Gained Since Last Visit: 0 Starting Weight: 224 lb Total Weight Loss (lbs): 8 lb (3.629 kg)   Body Composition  Body Fat %: 42.2 % Fat Mass (lbs): 91.2 lbs Muscle Mass (lbs): 118.4 lbs Total Body Water (lbs): 79.2 lbs Visceral Fat Rating : 11   Other Clinical Data Fasting: yes Labs: yes Today's Visit #: 56 Starting Date: 06/18/20    Chief Complaint:   OBESITY Tricia Miranda is here to discuss her progress with her obesity treatment plan.  She is on the the Category 2 Plan and states she is following her eating plan approximately 90-95 % of the time.  She states she is exercising Oncologist, Swimming, Pilates 30-90 minutes 2-3 times per week.   Interim History:  She was recently started on Zepbound 2.5mg  She has only had one dose on 05/27/2050 She did not refrigerate Zepbound supply- unable to use the other 3 injections. She experienced decreased appetite, nausea without vomiting.  Reviewed Bioimpedance results with pt: Muscle Mass: -  2 lbs Adipose Mass: -0.6 lb  Subjective:   1. Hypothyroidism, unspecified type  Latest Reference Range & Units 08/03/22 10:07 11/16/22 11:48 02/22/23 09:28  TSH 0.40 - 4.50 mIU/L 0.68 1.140 2.04   PCP/Dr. Lenord Fellers manages daily synthroid     2. Vitamin D deficiency  Latest Reference Range & Units 11/16/22 11:48 03/04/23 13:05  Vitamin D, 25-Hydroxy 30.0 - 100.0 ng/mL 43.9 40.1   She is on weekly Ergocalciferol- denies N/V/Muscle Weakness  3. Mixed hyperlipidemia Lipid Panel     Component Value Date/Time   CHOL 192 02/22/2023 0928   CHOL 215 (H) 11/16/2022 1148   TRIG 82 02/22/2023 0928   HDL 75 02/22/2023 0928   HDL 50 11/16/2022 1148    CHOLHDL 2.6 02/22/2023 0928   VLDL 26 11/08/2015 1026   LDLCALC 100 (H) 02/22/2023 0928   LABVLDL 32 11/16/2022 1148    Dr. Hilty/Cards manages her daily Pravastatin 40mg   4. Insulin resistance She was recently started on Zepbound 2.5mg  She has only had one dose on 05/27/2050 She did not refrigerate Zepbound supply- unable to use the other 3 injections. She experienced decreased appetite, nausea without vomiting.  Assessment/Plan:   1. Hypothyroidism, unspecified type Check Labs - TSH + free T4  2. Vitamin D deficiency (Primary) Check Labs - VITAMIN D 25 Hydroxy (Vit-D Deficiency, Fractures) Refill  SYNTHROID 100 MCG tablet   3. Mixed hyperlipidemia Check Labs - Comprehensive metabolic panel  4. Insulin resistance Check Labs - Hemoglobin A1c - Insulin, random  5. Obesity, Current BMI 33.8 Refill tirzepatide (ZEPBOUND) 2.5 MG/0.5ML Pen Inject 2.5 mg into the skin once a week. Dispense: 3 mL, Refills: 0 ordered   Tricia Miranda is currently in the action stage of change. As such, her goal is to continue with weight loss efforts. She has agreed to the Category 2 Plan.   Exercise goals: For substantial health benefits, adults should do at least 150 minutes (2 hours and 30 minutes) a week of moderate-intensity, or 75 minutes (1 hour and 15 minutes) a week of vigorous-intensity aerobic physical activity, or an equivalent combination of moderate- and vigorous-intensity aerobic activity. Aerobic activity should be performed in episodes of  at least 10 minutes, and preferably, it should be spread throughout the week.  Behavioral modification strategies: increasing lean protein intake, decreasing simple carbohydrates, increasing vegetables, increasing water intake, no skipping meals, meal planning and cooking strategies, keeping healthy foods in the home, and planning for success.  Tricia Miranda has agreed to follow-up with our clinic in 4 weeks. She was informed of the importance of frequent  follow-up visits to maximize her success with intensive lifestyle modifications for her multiple health conditions.   Tricia Miranda was informed we would discuss her lab results at her next visit unless there is a critical issue that needs to be addressed sooner. Tricia Miranda agreed to keep her next visit at the agreed upon time to discuss these results.  Objective:   Blood pressure 114/74, pulse 62, temperature 98.9 F (37.2 C), height 5\' 7"  (1.702 m), weight 216 lb (98 kg), last menstrual period 11/15/2018, SpO2 98%. Body mass index is 33.83 kg/m.  General: Cooperative, alert, well developed, in no acute distress. HEENT: Conjunctivae and lids unremarkable. Cardiovascular: Regular rhythm.  Lungs: Normal work of breathing. Neurologic: No focal deficits.   Lab Results  Component Value Date   CREATININE 0.58 02/22/2023   BUN 17 02/22/2023   NA 140 02/22/2023   K 4.9 02/22/2023   CL 104 02/22/2023   CO2 28 02/22/2023   Lab Results  Component Value Date   ALT 19 02/22/2023   AST 15 02/22/2023   ALKPHOS 98 11/16/2022   BILITOT 0.5 02/22/2023   Lab Results  Component Value Date   HGBA1C 5.2 02/22/2023   HGBA1C 5.2 11/16/2022   HGBA1C 5.3 05/07/2022   HGBA1C 5.1 05/20/2021   HGBA1C 5.2 05/28/2020   Lab Results  Component Value Date   INSULIN 16.4 11/16/2022   INSULIN 13.1 05/07/2022   INSULIN 9.2 05/20/2021   INSULIN 6.9 06/18/2020   Lab Results  Component Value Date   TSH 2.04 02/22/2023   Lab Results  Component Value Date   CHOL 192 02/22/2023   HDL 75 02/22/2023   LDLCALC 100 (H) 02/22/2023   TRIG 82 02/22/2023   CHOLHDL 2.6 02/22/2023   Lab Results  Component Value Date   VD25OH 40.1 03/04/2023   VD25OH 43.9 11/16/2022   VD25OH 36.6 05/07/2022   Lab Results  Component Value Date   WBC 6.0 02/22/2023   HGB 13.9 02/22/2023   HCT 42.6 02/22/2023   MCV 86.8 02/22/2023   PLT 280 02/22/2023   Lab Results  Component Value Date   IRON 47 01/15/2017   TIBC 448  01/15/2017   FERRITIN 122 06/18/2020   Attestation Statements:   Reviewed by clinician on day of visit: allergies, medications, problem list, medical history, surgical history, family history, social history, and previous encounter notes.  I have reviewed the above documentation for accuracy and completeness, and I agree with the above. -  Tricia Sharpe d. Ima Hafner, NP-C

## 2023-06-08 LAB — COMPREHENSIVE METABOLIC PANEL
ALT: 14 [IU]/L (ref 0–32)
AST: 13 [IU]/L (ref 0–40)
Albumin: 4.3 g/dL (ref 3.8–4.9)
Alkaline Phosphatase: 93 [IU]/L (ref 44–121)
BUN/Creatinine Ratio: 25 — ABNORMAL HIGH (ref 9–23)
BUN: 17 mg/dL (ref 6–24)
Bilirubin Total: 0.6 mg/dL (ref 0.0–1.2)
CO2: 23 mmol/L (ref 20–29)
Calcium: 9.3 mg/dL (ref 8.7–10.2)
Chloride: 103 mmol/L (ref 96–106)
Creatinine, Ser: 0.67 mg/dL (ref 0.57–1.00)
Globulin, Total: 2.1 g/dL (ref 1.5–4.5)
Glucose: 88 mg/dL (ref 70–99)
Potassium: 4.3 mmol/L (ref 3.5–5.2)
Sodium: 140 mmol/L (ref 134–144)
Total Protein: 6.4 g/dL (ref 6.0–8.5)
eGFR: 102 mL/min/{1.73_m2} (ref >59)

## 2023-06-08 LAB — HEMOGLOBIN A1C
Est. average glucose Bld gHb Est-mCnc: 103 mg/dL
Hgb A1c MFr Bld: 5.2 % (ref 4.8–5.6)

## 2023-06-08 LAB — TSH+FREE T4
Free T4: 1.46 ng/dL (ref 0.82–1.77)
TSH: 1.48 u[IU]/mL (ref 0.450–4.500)

## 2023-06-08 LAB — VITAMIN D 25 HYDROXY (VIT D DEFICIENCY, FRACTURES): Vit D, 25-Hydroxy: 46.5 ng/mL (ref 30.0–100.0)

## 2023-06-08 LAB — INSULIN, RANDOM: INSULIN: 8.3 u[IU]/mL (ref 2.6–24.9)

## 2023-06-14 ENCOUNTER — Ambulatory Visit: Payer: No Typology Code available for payment source | Admitting: Internal Medicine

## 2023-06-14 ENCOUNTER — Encounter: Payer: Self-pay | Admitting: Internal Medicine

## 2023-06-14 VITALS — BP 120/80 | HR 74 | Temp 98.5°F | Ht 67.0 in | Wt 225.0 lb

## 2023-06-14 DIAGNOSIS — I781 Nevus, non-neoplastic: Secondary | ICD-10-CM | POA: Diagnosis not present

## 2023-06-14 NOTE — Progress Notes (Signed)
 Patient Care Team: Margaree Mackintosh, MD as PCP - General (Internal Medicine)  Visit Date: 06/14/23  Subjective:  Patient ZO:XWRUE Tricia, Miranda DOB:03/21/1966,57 y.o. AVW:[id number]  Chief Complaint  Patient presents with   lesion on nose  58 y.o. Female presents today for acute visit with Lesion, Nose. Denies bumping/hitting nose, pain or pruritus, CPAP usage, or changing topical products. Notes that she did drive the other day and encountered a stretch of intense smokey aroma that caused her to sneeze excessively, and passed this again on the way back. Endorses sinus symptoms in the morning for the past few days. Mentions that she does sleep on her stomach, and does occasionally wake up with her face in her pillow.  Past Medical History:  Diagnosis Date   Anxiety    Anxiety    Bronchitis    Cancer (HCC)    basal cell- tiny   Cancer (HCC)    Fibroids    uterine    High cholesterol    Hypothyroidism    Joint pain    Low iron    Low vitamin D level    Osteoarthritis    PONV (postoperative nausea and vomiting)    Sleep apnea    uses oral device for mild sleep apnea    Thyroid disease    hypothyroidism   Vitamin B deficiency     Allergies  Allergen Reactions   Cat Dander Other (See Comments)    Family History  Problem Relation Age of Onset   Hypertension Mother    High Cholesterol Mother    Sleep apnea Mother    Heart disease Father    Alcohol abuse Father    Anxiety disorder Father    Stomach cancer Other    Colon cancer Neg Hx    Esophageal cancer Neg Hx    Pancreatic cancer Neg Hx    Prostate cancer Neg Hx    Rectal cancer Neg Hx    Social History   Social History Narrative   Social history: 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  She is married to an attorney who teaches at [employer] [address].  1 son.  Does not smoke.  Does not get a lot of exercise.       Family history: Father with history of MI and alcoholism.  Mother in  good health.  1 sister with history of hip dysplasia.  Maternal grandmother with history of adult onset diabetes.       Review of Systems  Skin:        (+) Lesion, dorsum of nose  All other systems reviewed and are negative.    Objective:  Vitals: BP 120/80   Pulse 74   Temp 98.5 F (36.9 C)   Ht 5\' 7"  (1.702 m)   Wt 225 lb (102.1 kg)   LMP 11/15/2018   SpO2 97%   BMI 35.24 kg/m   Physical Exam Vitals and nursing note reviewed.  Constitutional:      General: She is not in acute distress.    Appearance: Normal appearance. She is not toxic-appearing.  HENT:     Head: Normocephalic and atraumatic.  Pulmonary:     Effort: Pulmonary effort is normal.  Skin:    General: Skin is warm and dry.     Findings: Lesion (nose) present.     Comments: Slight redness, minor raised capsular area on upper dorsum  Neurological:     Mental Status: She is alert and oriented to person, place, and time. Mental status is at baseline.  Psychiatric:        Mood and Affect: Mood normal.        Behavior: Behavior normal.        Thought Content: Thought content normal.        Judgment: Judgment normal.     Results:  Studies Obtained And Personally Reviewed By Me: Labs:     Component Value Date/Time   NA 140 06/07/2023 1035   K 4.3 06/07/2023 1035   CL 103 06/07/2023 1035   CO2 23 06/07/2023 1035   GLUCOSE 88 06/07/2023 1035   GLUCOSE 94 02/22/2023 0928   BUN 17 06/07/2023 1035   CREATININE 0.67 06/07/2023 1035   CREATININE 0.58 02/22/2023 0928   CALCIUM 9.3 06/07/2023 1035   PROT 6.4 06/07/2023 1035   ALBUMIN 4.3 06/07/2023 1035   AST 13 06/07/2023 1035   ALT 14 06/07/2023 1035   ALKPHOS 93 06/07/2023 1035   BILITOT 0.6 06/07/2023 1035   GFRNONAA >60 07/08/2022 1009   GFRNONAA 108 05/28/2020 1145   GFRAA 126 05/28/2020 1145    Lab Results  Component Value Date   WBC 6.0 02/22/2023   HGB 13.9 02/22/2023   HCT 42.6 02/22/2023   MCV 86.8 02/22/2023   PLT 280  02/22/2023   Lab Results  Component Value Date   CHOL 192 02/22/2023   HDL 75 02/22/2023   LDLCALC 100 (H) 02/22/2023   TRIG 82 02/22/2023   CHOLHDL 2.6 02/22/2023   Lab Results  Component Value Date   HGBA1C 5.2 06/07/2023    Lab Results  Component Value Date   TSH 1.480 06/07/2023   Assessment & Plan:   Telangiectasia of Face: see photo- appears to be oblong shape that is macular without vesicles. Etiology unclear. ? Contact dermatitis or pressure induced? Suspect this is due to irritation or pressure induced while sleeping. Expect this will resolve within a day or two. Call if symptoms do not improve within 3-5 days.    I,Emily Lagle,acting as a Neurosurgeon for Margaree Mackintosh, MD.,have documented all relevant documentation on the behalf of Margaree Mackintosh, MD,as directed by  Margaree Mackintosh, MD while in the presence of Margaree Mackintosh, MD.   I, Margaree Mackintosh, MD, have reviewed all documentation for this visit. The documentation on 06/14/23 for the exam, diagnosis, procedures, and orders are all accurate and complete.

## 2023-06-15 ENCOUNTER — Ambulatory Visit: Payer: No Typology Code available for payment source | Admitting: Internal Medicine

## 2023-06-29 IMAGING — MG MM DIGITAL DIAGNOSTIC UNILAT*L* W/ TOMO W/ CAD
4 series · 4 of 12 positions shown · non-contrast
Comparison: Previous exam(s).

CLINICAL DATA: Patient returns after screening study for evaluation
of possible LEFT breast distortion.

EXAM:
DIGITAL DIAGNOSTIC UNILATERAL LEFT MAMMOGRAM WITH TOMOSYNTHESIS AND
CAD
TECHNIQUE: Left digital diagnostic mammography and breast tomosynthesis was
performed. The images were evaluated with computer-aided detection.

[L ML synth-2D]
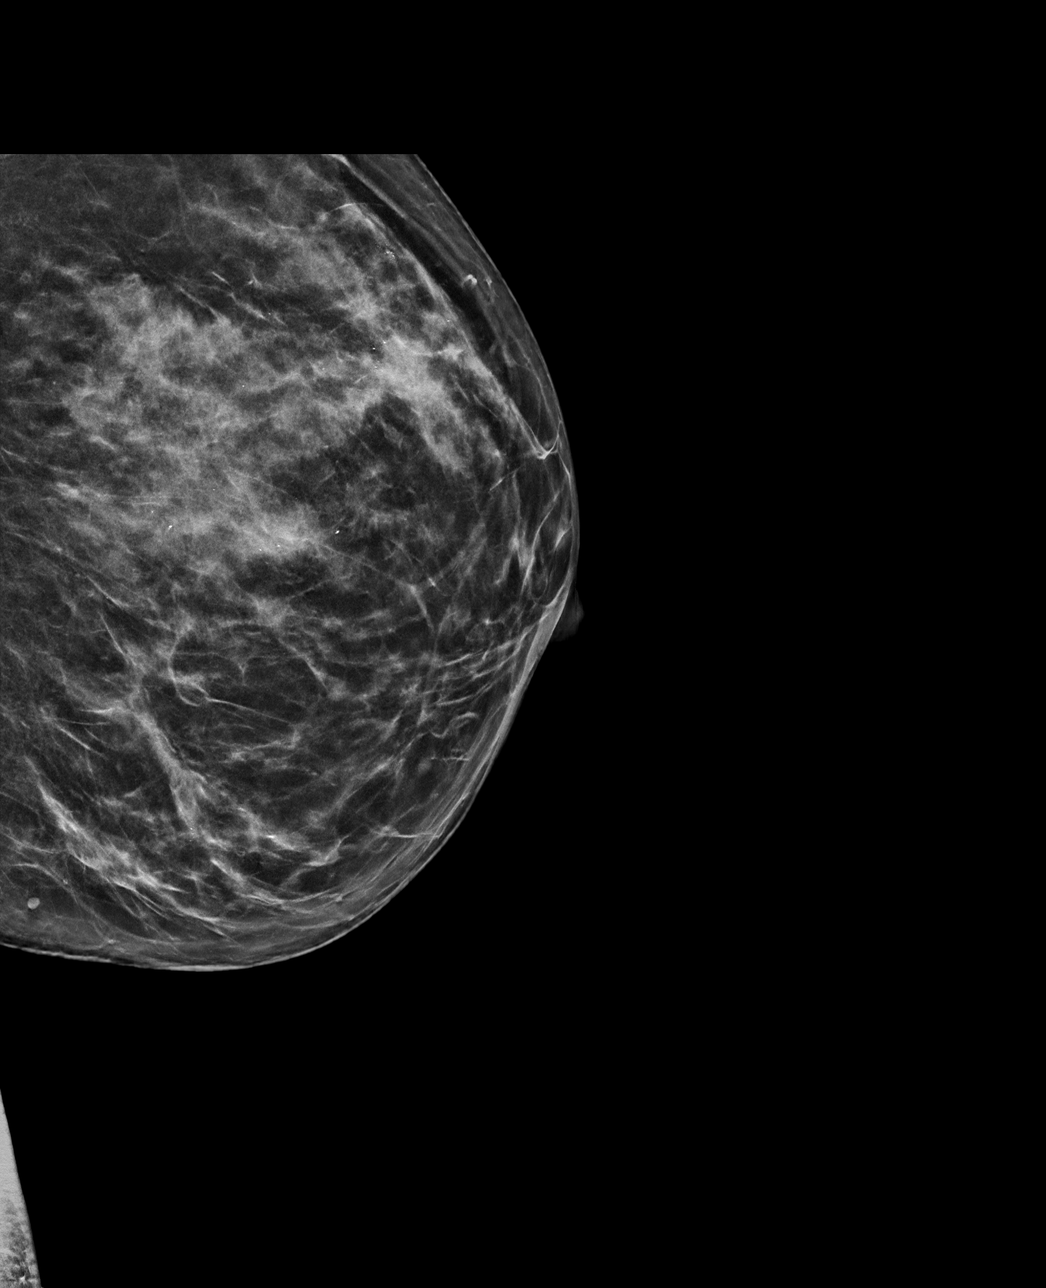

[L MLO synth-2D]
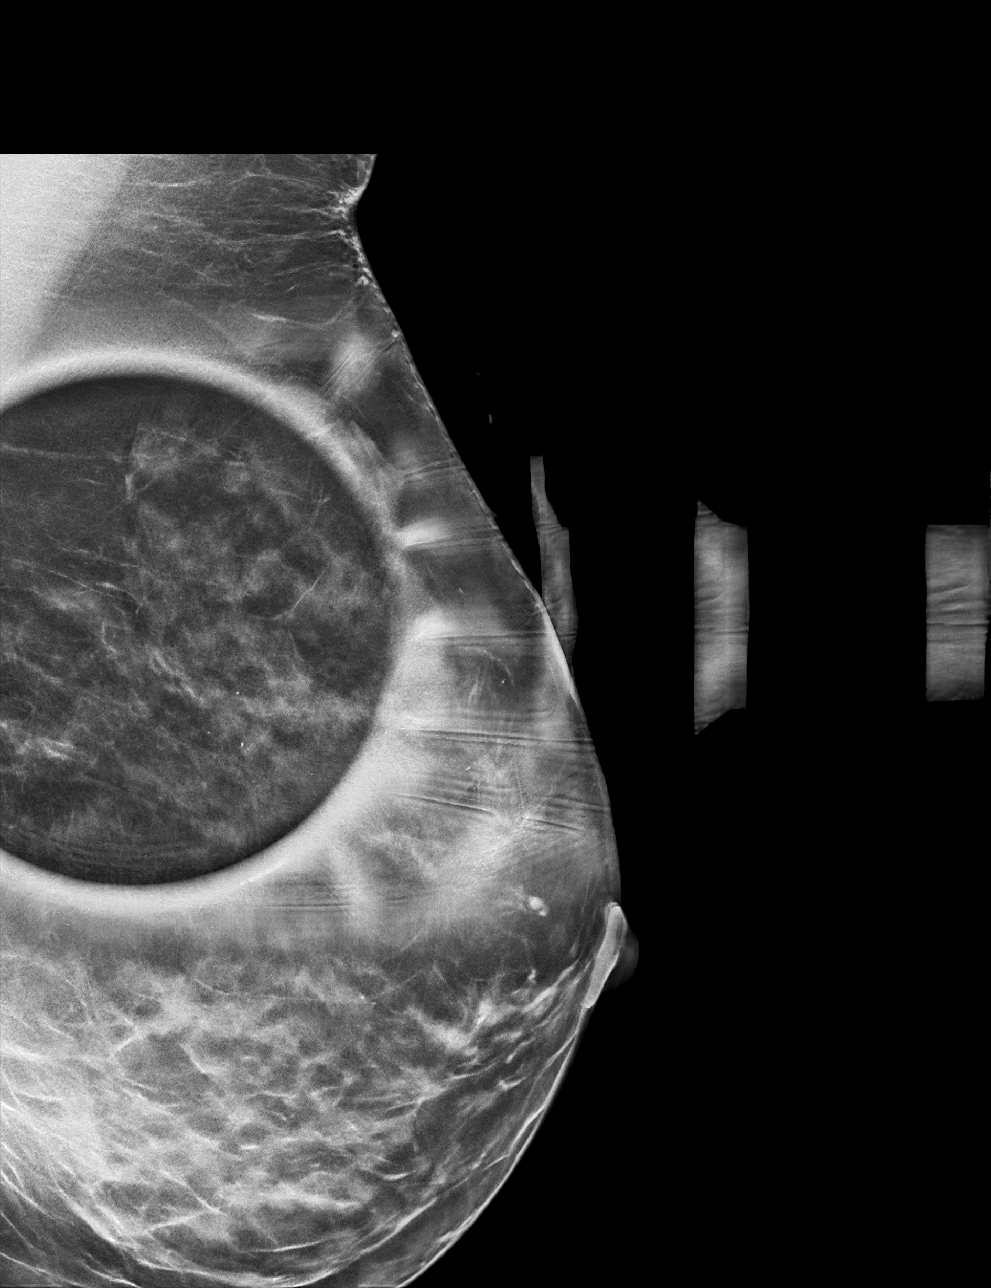

[L ML tomo · tomo slice 34/67.0]
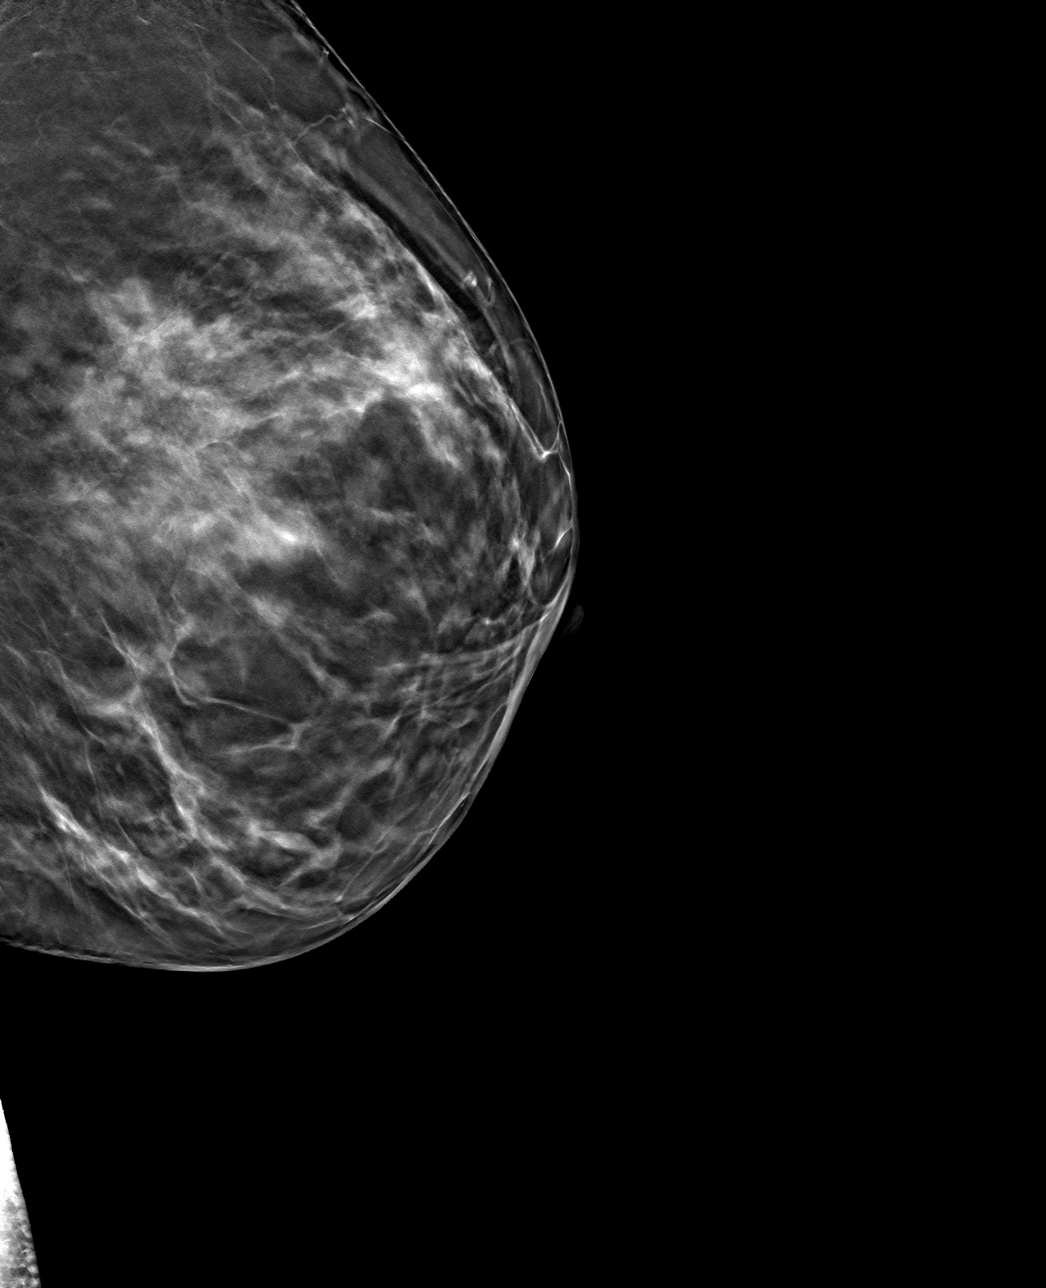

[L MLO tomo · tomo slice 33/64.0]
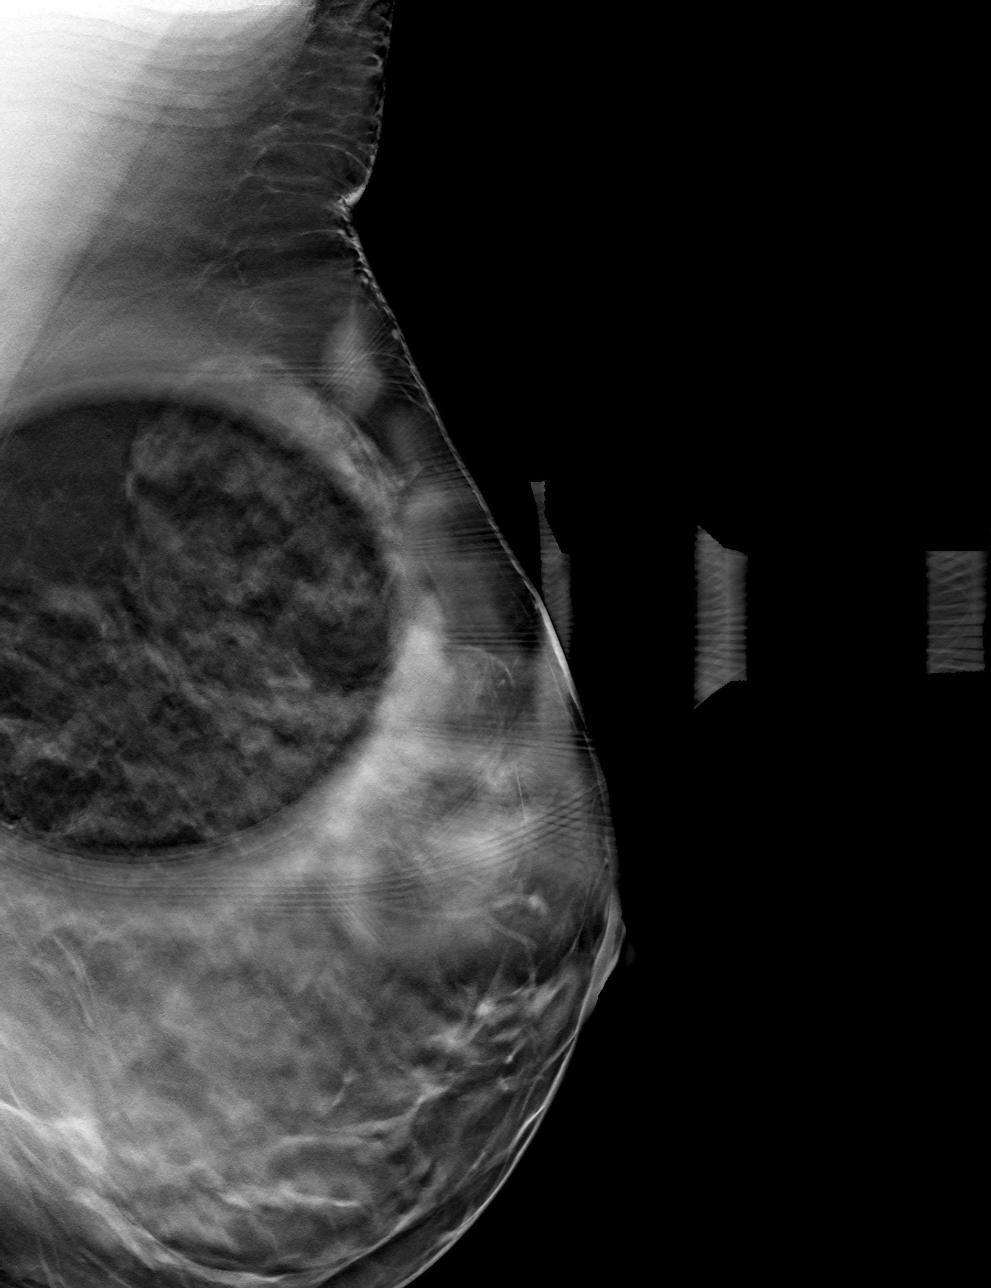

[4 of 12 positions shown; findings below may reference images not displayed]

ACR Breast Density Category c: The breast tissue is heterogeneously
dense, which may obscure small masses.
FINDINGS: Additional 2-D and 3-D images are performed. These views showed no
persistent distortion in the superior portion of the LEFT breast. No
suspicious mass, distortion, or microcalcifications are identified
to suggest presence of malignancy.
IMPRESSION: No mammographic evidence for malignancy.

RECOMMENDATION:
Screening mammogram in one year.(Code:DC-F-OLO)

I have discussed the findings and recommendations with the patient.
If applicable, a reminder letter will be sent to the patient
regarding the next appointment.

BI-RADS CATEGORY  1: Negative.

## 2023-07-01 ENCOUNTER — Other Ambulatory Visit (INDEPENDENT_AMBULATORY_CARE_PROVIDER_SITE_OTHER): Payer: Self-pay | Admitting: Adult Health

## 2023-07-01 DIAGNOSIS — E559 Vitamin D deficiency, unspecified: Secondary | ICD-10-CM

## 2023-07-05 ENCOUNTER — Ambulatory Visit (INDEPENDENT_AMBULATORY_CARE_PROVIDER_SITE_OTHER): Payer: No Typology Code available for payment source | Admitting: Family Medicine

## 2023-07-05 ENCOUNTER — Encounter (INDEPENDENT_AMBULATORY_CARE_PROVIDER_SITE_OTHER): Payer: Self-pay | Admitting: Family Medicine

## 2023-07-05 VITALS — BP 97/67 | HR 62 | Temp 97.9°F | Ht 67.0 in | Wt 215.0 lb

## 2023-07-05 DIAGNOSIS — E559 Vitamin D deficiency, unspecified: Secondary | ICD-10-CM | POA: Diagnosis not present

## 2023-07-05 DIAGNOSIS — E039 Hypothyroidism, unspecified: Secondary | ICD-10-CM | POA: Diagnosis not present

## 2023-07-05 DIAGNOSIS — E88819 Insulin resistance, unspecified: Secondary | ICD-10-CM

## 2023-07-05 DIAGNOSIS — E782 Mixed hyperlipidemia: Secondary | ICD-10-CM | POA: Diagnosis not present

## 2023-07-05 DIAGNOSIS — Z6833 Body mass index (BMI) 33.0-33.9, adult: Secondary | ICD-10-CM

## 2023-07-05 DIAGNOSIS — E669 Obesity, unspecified: Secondary | ICD-10-CM

## 2023-07-05 MED ORDER — ZEPBOUND 2.5 MG/0.5ML ~~LOC~~ SOAJ: 2.5000 mg | SUBCUTANEOUS | 0 refills | Status: DC

## 2023-07-05 MED ORDER — ERGOCALCIFEROL 1.25 MG (50000 UT) PO CAPS
50000.0000 [IU] | ORAL_CAPSULE | ORAL | 0 refills | Status: DC
Start: 2023-07-05 — End: 2023-08-04

## 2023-07-05 NOTE — Progress Notes (Signed)
 Tricia Miranda, D.O.  ABFM, ABOM Specializing in Clinical Bariatric Medicine  Office located at: [address] [place], [place]  [address]   Assessment and Plan:   FOR THE DISEASE OF OBESITY: Obesity, Current BMI 33.67 Obesity, Starting BMI 35.08 Assessment & Plan: Since last office visit on 06/07/2023, patient's muscle mass has not changed. Fat mass has decreased by 0.8 lbs. Total body water has increased by 1 lb.  Counseling done on how various foods will affect these numbers and how to maximize success  Total lbs lost to date: 9 lbs Total weight loss percentage to date: 4.02%    Recommended Dietary Goals Troyce is currently in the action stage of change. As such, her goal is to continue weight management plan.  She has agreed to: continue current plan   Behavioral Intervention We discussed the following today: decreasing simple carbohydrates , increasing fiber rich foods, and continue to practice mindfulness when eating  Additional resources provided today: None  Evidence-based interventions for health behavior change were utilized today including the discussion of self monitoring techniques, problem-solving barriers and SMART goal setting techniques.   Regarding patient's less desirable eating habits and patterns, we employed the technique of small changes.   Pt will specifically work on: n/a   Recommended Physical Activity Goals Heavenleigh has been advised to work up to 150 minutes of moderate intensity aerobic activity a week and strengthening exercises 2-3 times per week for cardiovascular health, weight loss maintenance and preservation of muscle mass.   She has agreed to :  Think about enjoyable ways to increase daily physical activity and overcoming barriers to exercise and Increase physical activity in their day and reduce sedentary time (increase NEAT).   Pharmacotherapy We both agreed to : continue with nutritional and behavioral strategies and current  medication regimen   FOR ASSOCIATED CONDITIONS ADDRESSED TODAY:  Obesity, Current BMI 33.67 Obesity, Starting BMI 35.08 Insulin resistance Assessment & Plan: Lab Results  Component Value Date   HGBA1C 5.2 06/07/2023   HGBA1C 5.2 02/22/2023   HGBA1C 5.2 11/16/2022   INSULIN 8.3 06/07/2023   INSULIN 16.4 11/16/2022   INSULIN 13.1 05/07/2022   Lab Results  Component Value Date   CREATININE 0.67 06/07/2023   BUN 17 06/07/2023   NA 140 06/07/2023   K 4.3 06/07/2023   CL 103 06/07/2023   CO2 23 06/07/2023      Component Value Date/Time   PROT 6.4 06/07/2023 1035   ALBUMIN 4.3 06/07/2023 1035   AST 13 06/07/2023 1035   ALT 14 06/07/2023 1035   ALKPHOS 93 06/07/2023 1035   BILITOT 0.6 06/07/2023 1035   BILIDIR 0.1 11/28/2019 1217   IBILI 0.5 11/28/2019 1217  Pt is on Zepbound 2.5 mg once weekly. She admits to not being consistent with medication, but has been continuous more recently. No GI upset. Reviewed labs from LOV with pt, A1c WNL and stable. Insulin levels greatly improved from 16.4 to 8.3, still slightly above goal of <5. Kidney and liver enzymes WNL. Discussed potentially increasing Zepbound dose to 5 mg, pt desires to stay at current dose. Continue closely following prudent nutrition plan. Increase exercise and decrease simple carbs in diet. Will refill medication today.  Relevant Orders: -     Zepbound; Inject 2.5 mg into the skin once a week.  Dispense: 3 mL; Refill: 0   Vitamin D deficiency Assessment & Plan: Lab Results  Component Value Date   VD25OH 75.5 06/07/2023  Aqueelah is on  ERGO 50000 units once weekly. Per last obtained labs, Vit D levels were slightly below goal of 50-70. Informed pt that Vitamin D is a lipophilic vitamin and shedding fat increases the availability of vitamin d. Continue supplementation regimen. Will continue to monitor levels as it pertains to her weight loss journey.   Relevant Orders: -     Ergocalciferol; Take 1 capsule (50,000  Units total) by mouth once a week.  Dispense: 4 capsule; Refill: 0   Mixed hyperlipidemia Assessment & Plan: Letty is on Paravastatin 40 mg once daily and Zetia 10 mg once daily. Condition managed by Dr. Cyndie Chime of cardiology. Continue with our prudent nutritional plan that is low in saturated and trans fats, and low in fatty carbs to improve these numbers. Will continue to monitor alongside cardiologist.    Hypothyroidism, unspecified type Assessment & Plan: Lab Results  Component Value Date   TSH 1.480 06/07/2023   FREET4 1.46 06/07/2023  Pt is on Synthroid 100 mcg once daily. Condition managed by PCP, Dr. Lenord Fellers. Per last obtained labs, thyroid levels WNL. Continue current medication regimen. No concerns today in this regard. Will continue to monitor alongside PCP.   Follow up:   Return in about 30 days (around 08/04/2023). She was informed of the importance of frequent follow up visits to maximize her success with intensive lifestyle modifications for her multiple health conditions.  Subjective:   Chief complaint: Obesity Teagyn is here to discuss her progress with her obesity treatment plan. She is on the the Category 2 Plan and states she is following her eating plan approximately 90% of the time. She states she is doing cardio 70 minutes 3 days per week.  Interval History:  Rifky Lapre Mcmanaman is here for a follow up office visit. Since last OV on 06/07/2023, Vergia is down 1 lb. She reports going on a recent 3 day vacation. Also, has been going to a rowing group on Saturdays. Additionally, pt recently went on a hike and experienced body pain afterwards which limited her movement.   Pharmacotherapy for weight loss: She is currently taking Zepbound 2.5 mg once weekly.   Review of Systems:  Pertinent positives were addressed with patient today.  Reviewed by clinician on day of visit: allergies, medications, problem list, medical history, surgical history, family history, social  history, and previous encounter notes.  Weight Summary and Biometrics   Weight Lost Since Last Visit: 1lb  Weight Gained Since Last Visit: 0   Vitals Temp: 97.9 F (36.6 C) BP: 97/67 Pulse Rate: 62 SpO2: 98 %   Anthropometric Measurements Height: 5\' 7"  (1.702 m) Weight: 215 lb (97.5 kg) BMI (Calculated): 33.67 Weight at Last Visit: 216lb Weight Lost Since Last Visit: 1lb Weight Gained Since Last Visit: 0 Starting Weight: 224lb Total Weight Loss (lbs): 9 lb (4.082 kg)   Body Composition  Body Fat %: 42 % Fat Mass (lbs): 90.4 lbs Muscle Mass (lbs): 118.4 lbs Total Body Water (lbs): 80.2 lbs Visceral Fat Rating : 11   Other Clinical Data Fasting: no Labs: no Today's Visit #: 40 Starting Date: 06/18/20    Objective:   PHYSICAL EXAM: Blood pressure 97/67, pulse 62, temperature 97.9 F (36.6 C), height 5\' 7"  (1.702 m), weight 215 lb (97.5 kg), last menstrual period 11/15/2018, SpO2 98%. Body mass index is 33.67 kg/m.  General: she is overweight, cooperative and in no acute distress. PSYCH: Has normal mood, affect and thought process.   HEENT: EOMI, sclerae are anicteric. Lungs: Normal breathing effort,  no conversational dyspnea. Extremities: Moves * 4 Neurologic: A and O * 3, good insight  DIAGNOSTIC DATA REVIEWED: BMET    Component Value Date/Time   NA 140 06/07/2023 1035   K 4.3 06/07/2023 1035   CL 103 06/07/2023 1035   CO2 23 06/07/2023 1035   GLUCOSE 88 06/07/2023 1035   GLUCOSE 94 02/22/2023 0928   BUN 17 06/07/2023 1035   CREATININE 0.67 06/07/2023 1035   CREATININE 0.58 02/22/2023 0928   CALCIUM 9.3 06/07/2023 1035   GFRNONAA >60 07/08/2022 1009   GFRNONAA 108 05/28/2020 1145   GFRAA 126 05/28/2020 1145   Lab Results  Component Value Date   HGBA1C 5.2 06/07/2023   HGBA1C 5.3 10/07/2010   Lab Results  Component Value Date   INSULIN 8.3 06/07/2023   INSULIN 6.9 06/18/2020   Lab Results  Component Value Date   TSH 1.480  06/07/2023   CBC    Component Value Date/Time   WBC 6.0 02/22/2023 0928   RBC 4.91 02/22/2023 0928   HGB 13.9 02/22/2023 0928   HCT 42.6 02/22/2023 0928   PLT 280 02/22/2023 0928   MCV 86.8 02/22/2023 0928   MCH 28.3 02/22/2023 0928   MCHC 32.6 02/22/2023 0928   RDW 12.7 02/22/2023 0928   Iron Studies    Component Value Date/Time   IRON 47 01/15/2017 0906   TIBC 448 01/15/2017 0906   FERRITIN 122 06/18/2020 1059   IRONPCTSAT 10 (L) 01/15/2017 0906   Lipid Panel     Component Value Date/Time   CHOL 192 02/22/2023 0928   CHOL 215 (H) 11/16/2022 1148   TRIG 82 02/22/2023 0928   HDL 75 02/22/2023 0928   HDL 50 11/16/2022 1148   CHOLHDL 2.6 02/22/2023 0928   VLDL 26 11/08/2015 1026   LDLCALC 100 (H) 02/22/2023 0928   Hepatic Function Panel     Component Value Date/Time   PROT 6.4 06/07/2023 1035   ALBUMIN 4.3 06/07/2023 1035   AST 13 06/07/2023 1035   ALT 14 06/07/2023 1035   ALKPHOS 93 06/07/2023 1035   BILITOT 0.6 06/07/2023 1035   BILIDIR 0.1 11/28/2019 1217   IBILI 0.5 11/28/2019 1217      Component Value Date/Time   TSH 1.480 06/07/2023 1035   Nutritional Lab Results  Component Value Date   VD25OH 46.5 06/07/2023   VD25OH 40.1 03/04/2023   VD25OH 43.9 11/16/2022    Attestations:   I, Camryn Mix, acting as a Stage manager for [employer], DO., have compiled all relevant documentation for today's office visit on behalf of Thomasene Lot, DO, while in the presence of [employer], DO.  Reviewed by clinician on day of visit: allergies, medications, problem list, medical history, surgical history, family history, social history, and previous encounter notes pertinent to patient's obesity diagnosis.  I have reviewed the above documentation for accuracy and completeness, and I agree with the above. Tricia Miranda, D.O.  The 21st Century Cures Act was signed into law in 2016 which includes the topic of electronic health records.  This provides  immediate access to information in MyChart.  This includes consultation notes, operative notes, office notes, lab results and pathology reports.  If you have any questions about what you read please let us know at your next visit so we can discuss your concerns and take corrective action if need be.  We are right here with you.

## 2023-07-31 ENCOUNTER — Other Ambulatory Visit (INDEPENDENT_AMBULATORY_CARE_PROVIDER_SITE_OTHER): Payer: Self-pay | Admitting: Family Medicine

## 2023-07-31 DIAGNOSIS — E559 Vitamin D deficiency, unspecified: Secondary | ICD-10-CM

## 2023-08-04 ENCOUNTER — Ambulatory Visit (INDEPENDENT_AMBULATORY_CARE_PROVIDER_SITE_OTHER): Payer: Self-pay | Admitting: Family Medicine

## 2023-08-04 ENCOUNTER — Encounter (INDEPENDENT_AMBULATORY_CARE_PROVIDER_SITE_OTHER): Payer: Self-pay | Admitting: Family Medicine

## 2023-08-04 VITALS — BP 113/74 | HR 69 | Temp 98.4°F | Ht 67.0 in | Wt 212.0 lb

## 2023-08-04 DIAGNOSIS — E559 Vitamin D deficiency, unspecified: Secondary | ICD-10-CM | POA: Diagnosis not present

## 2023-08-04 DIAGNOSIS — E669 Obesity, unspecified: Secondary | ICD-10-CM

## 2023-08-04 DIAGNOSIS — E88819 Insulin resistance, unspecified: Secondary | ICD-10-CM

## 2023-08-04 DIAGNOSIS — Z6833 Body mass index (BMI) 33.0-33.9, adult: Secondary | ICD-10-CM | POA: Diagnosis not present

## 2023-08-04 DIAGNOSIS — E66812 Obesity, class 2: Secondary | ICD-10-CM

## 2023-08-04 MED ORDER — ERGOCALCIFEROL 1.25 MG (50000 UT) PO CAPS: 50000.0000 [IU] | ORAL_CAPSULE | ORAL | 0 refills | Status: DC

## 2023-08-04 MED ORDER — ZEPBOUND 2.5 MG/0.5ML ~~LOC~~ SOAJ: 2.5000 mg | SUBCUTANEOUS | 0 refills | Status: DC

## 2023-08-04 NOTE — Progress Notes (Signed)
 Carlye Grippe, D.O.  ABFM, ABOM Specializing in Clinical Bariatric Medicine  Office located at: [address] [place], [place]  [address]   Assessment and Plan:  No orders of the defined types were placed in this encounter.  Medications Discontinued During This Encounter  Medication Reason   ergocalciferol (DRISDOL) 1.25 MG (50000 UT) capsule Reorder   tirzepatide (ZEPBOUND) 2.5 MG/0.5ML Pen Reorder    Meds ordered this encounter  Medications   ergocalciferol (DRISDOL) 1.25 MG (50000 UT) capsule    Sig: Take 1 capsule (50,000 Units total) by mouth once a week.    Dispense:  4 capsule    Refill:  0   tirzepatide (ZEPBOUND) 2.5 MG/0.5ML Pen    Sig: Inject 2.5 mg into the skin once a week.    Dispense:  3 mL    Refill:  0     FOR THE DISEASE OF OBESITY:  Obesity, Current BMI 33.2 Obesity, Starting BMI 35.08 Assessment & Plan: Since last office visit on 07/05/2023, patient's muscle mass has decreased by 0.8 lbs. Fat mass has decreased by 2.2 lbs. Total body water has decreased by 1.4 lbs.  Counseling done on how various foods will affect these numbers and how to maximize success  Total lbs lost to date: 12 lbs Total weight loss percentage to date: 5.36%   Recommended Dietary Goals Tricia Miranda is currently in the action stage of change. As such, her goal is to continue weight management plan.  She has agreed to: continue current plan with 1300-1350 cal and 90++g protein daily   Behavioral Intervention We discussed the following today:[employer], avoiding skipping meals and focusing on food with a 10:1 ratio of calories: grams of protein  Additional resources provided today: None  Evidence-based interventions for health behavior change were utilized today including the discussion of self monitoring techniques, problem-solving barriers and SMART goal setting techniques.   Regarding patient's less desirable eating habits and patterns, we employed the technique  of small changes.   Pt will specifically work on: n/a   Recommended Physical Activity Goals Tricia Miranda has been advised to work up to 300-450 minutes of moderate intensity aerobic activity a week and strengthening exercises 2-3 times per week for cardiovascular health, weight loss maintenance and preservation of muscle mass.   She has agreed to :  Continue current level of physical activity    Pharmacotherapy We both agreed to : continue with nutritional and behavioral strategies and current medication regimen   FOR ASSOCIATED CONDITIONS ADDRESSED TODAY:  Obesity, Current BMI 33.2 Obesity, Starting BMI 35.08 Assessment & Plan: Tricia Miranda is on Zepbound 2.5 mg once weekly for weight loss. Hunger/cravings well controlled. Pt endorses occasionally feeling nauseous, however she sometimes skips meals d/t busy schedule or not feeling hungry. Encouraged pt to avoid skipping meals to decrease chances of feeling nauseous. Continue current medication regimen. Will refill today.  Relevant Orders: -     Zepbound; Inject 2.5 mg into the skin once a week.  Dispense: 3 mL; Refill: 0   Insulin resistance Assessment & Plan: Tricia Miranda is not taking any medications for this condition. Diet/exercise approach. Reminded pt of importance of dietary and lifestyle changes as first line treatment for IR. Explained role of simple carbs and insulin levels on hunger and cravings. Continue to decrease simple carbs/ sugars; increase fiber and proteins -> follow her meal plan. Will continue to monitor levels regularly as they pertain to her weight loss journey.    Vitamin D deficiency  Assessment & Plan: Tricia Miranda takes ERGO 50,000 units once weekly. Compliant with current supplementation regimen. Continue at current dose. Will refill supplement today.   Relevant Orders: -     Ergocalciferol; Take 1 capsule (50,000 Units total) by mouth once a week.  Dispense: 4 capsule; Refill: 0  Follow up:   Return in about 26 days (around  08/30/2023). She was informed of the importance of frequent follow up visits to maximize her success with intensive lifestyle modifications for her multiple health conditions.  Subjective:   Chief complaint: Obesity Tricia Miranda is here to discuss her progress with her obesity treatment plan. She is on the the Category 2 Plan and states she is following her eating plan approximately 90% of the time. She states she is doing rowing, pilates, and hiking 80 minutes 3 days per week.  Interval History:  Tricia Miranda is here for a follow up office visit. Since last OV on 07/05/2023, pt is down 3 lbs. She endorses exercising more and not being as hungry while adhering to MP. [name] admits to eating out a few times in the last few weeks, but not often. Additionally, two days a week she is eating protein bars or yogurt for breakfast and frozen meals for lunch at work.   Pharmacotherapy for weight loss: She is currently taking Zepbound 2.5 mg once weekly.   Review of Systems:  Pertinent positives were addressed with patient today.  Reviewed by clinician on day of visit: allergies, medications, problem list, medical history, surgical history, family history, social history, and previous encounter notes.  Weight Summary and Biometrics   Weight Lost Since Last Visit: 3  Weight Gained Since Last Visit: 0   Vitals Temp: 98.4 F (36.9 C) BP: 113/74 Pulse Rate: 69 SpO2: 97 %   Anthropometric Measurements Height: 5\' 7"  (1.702 m) Weight: 212 lb (96.2 kg) BMI (Calculated): 33.2 Weight at Last Visit: 215 lb Weight Lost Since Last Visit: 3 Weight Gained Since Last Visit: 0 Starting Weight: 224 lb Total Weight Loss (lbs): 12 lb (5.443 kg)   Body Composition  Body Fat %: 41.6 % Fat Mass (lbs): 88.2 lbs Muscle Mass (lbs): 117.6 lbs Total Body Water (lbs): 78.8 lbs Visceral Fat Rating : 11   Other Clinical Data Fasting: yes Labs: ? Today's Visit #: 41 Starting Date: 06/18/20    Objective:    PHYSICAL EXAM: Blood pressure 113/74, pulse 69, temperature 98.4 F (36.9 C), height 5\' 7"  (1.702 m), weight 212 lb (96.2 kg), last menstrual period 11/15/2018, SpO2 97%. Body mass index is 33.2 kg/m.  General: she is overweight, cooperative and in no acute distress. PSYCH: Has normal mood, affect and thought process.   HEENT: EOMI, sclerae are anicteric. Lungs: Normal breathing effort, no conversational dyspnea. Extremities: Moves * 4 Neurologic: A and O * 3, good insight  DIAGNOSTIC DATA REVIEWED: BMET    Component Value Date/Time   NA 140 06/07/2023 1035   K 4.3 06/07/2023 1035   CL 103 06/07/2023 1035   CO2 23 06/07/2023 1035   GLUCOSE 88 06/07/2023 1035   GLUCOSE 94 02/22/2023 0928   BUN 17 06/07/2023 1035   CREATININE 0.67 06/07/2023 1035   CREATININE 0.58 02/22/2023 0928   CALCIUM 9.3 06/07/2023 1035   GFRNONAA >60 07/08/2022 1009   GFRNONAA 108 05/28/2020 1145   GFRAA 126 05/28/2020 1145   Lab Results  Component Value Date   HGBA1C 5.2 06/07/2023   HGBA1C 5.3 10/07/2010   Lab Results  Component Value Date  INSULIN 8.3 06/07/2023   INSULIN 6.9 06/18/2020   Lab Results  Component Value Date   TSH 1.480 06/07/2023   CBC    Component Value Date/Time   WBC 6.0 02/22/2023 0928   RBC 4.91 02/22/2023 0928   HGB 13.9 02/22/2023 0928   HCT 42.6 02/22/2023 0928   PLT 280 02/22/2023 0928   MCV 86.8 02/22/2023 0928   MCH 28.3 02/22/2023 0928   MCHC 32.6 02/22/2023 0928   RDW 12.7 02/22/2023 0928   Iron Studies    Component Value Date/Time   IRON 47 01/15/2017 0906   TIBC 448 01/15/2017 0906   FERRITIN 122 06/18/2020 1059   IRONPCTSAT 10 (L) 01/15/2017 0906   Lipid Panel     Component Value Date/Time   CHOL 192 02/22/2023 0928   CHOL 215 (H) 11/16/2022 1148   TRIG 82 02/22/2023 0928   HDL 75 02/22/2023 0928   HDL 50 11/16/2022 1148   CHOLHDL 2.6 02/22/2023 0928   VLDL 26 11/08/2015 1026   LDLCALC 100 (H) 02/22/2023 0928   Hepatic Function  Panel     Component Value Date/Time   PROT 6.4 06/07/2023 1035   ALBUMIN 4.3 06/07/2023 1035   AST 13 06/07/2023 1035   ALT 14 06/07/2023 1035   ALKPHOS 93 06/07/2023 1035   BILITOT 0.6 06/07/2023 1035   BILIDIR 0.1 11/28/2019 1217   IBILI 0.5 11/28/2019 1217      Component Value Date/Time   TSH 1.480 06/07/2023 1035   Nutritional Lab Results  Component Value Date   VD25OH 46.5 06/07/2023   VD25OH 40.1 03/04/2023   VD25OH 43.9 11/16/2022    Attestations:   I, Tricia Miranda, acting as a Stage manager for [employer], DO., have compiled all relevant documentation for today's office visit on behalf of Tricia Sensor, DO, while in the presence of [employer], DO.  I have reviewed the above documentation for accuracy and completeness, and I agree with the above. Tricia Miranda, D.O.  The 21st Century Cures Act was signed into law in 2016 which includes the topic of electronic health records.  This provides immediate access to information in MyChart.  This includes consultation notes, operative notes, office notes, lab results and pathology reports.  If you have any questions about what you read please let us  know at your next visit so we can discuss your concerns and take corrective action if need be.  We are right here with you.

## 2023-08-17 ENCOUNTER — Other Ambulatory Visit: Payer: No Typology Code available for payment source

## 2023-08-17 DIAGNOSIS — E782 Mixed hyperlipidemia: Secondary | ICD-10-CM

## 2023-08-17 DIAGNOSIS — Z79890 Hormone replacement therapy (postmenopausal): Secondary | ICD-10-CM

## 2023-08-17 DIAGNOSIS — Z0000 Encounter for general adult medical examination without abnormal findings: Secondary | ICD-10-CM

## 2023-08-17 DIAGNOSIS — E039 Hypothyroidism, unspecified: Secondary | ICD-10-CM

## 2023-08-17 LAB — COMPLETE METABOLIC PANEL WITHOUT GFR
AG Ratio: 1.9 (calc) (ref 1.0–2.5)
ALT: 13 U/L (ref 6–29)
AST: 12 U/L (ref 10–35)
Albumin: 4.2 g/dL (ref 3.6–5.1)
Alkaline phosphatase (APISO): 72 U/L (ref 37–153)
BUN: 13 mg/dL (ref 7–25)
CO2: 28 mmol/L (ref 20–32)
Calcium: 8.9 mg/dL (ref 8.6–10.4)
Chloride: 106 mmol/L (ref 98–110)
Creat: 0.56 mg/dL (ref 0.50–1.03)
Globulin: 2.2 g/dL (ref 1.9–3.7)
Glucose, Bld: 79 mg/dL (ref 65–99)
Potassium: 4.4 mmol/L (ref 3.5–5.3)
Sodium: 140 mmol/L (ref 135–146)
Total Bilirubin: 0.5 mg/dL (ref 0.2–1.2)
Total Protein: 6.4 g/dL (ref 6.1–8.1)

## 2023-08-17 LAB — TSH: TSH: 1.39 m[IU]/L (ref 0.40–4.50)

## 2023-08-17 LAB — LIPID PANEL
Cholesterol: 161 mg/dL (ref <200)
HDL: 59 mg/dL (ref >=50)
LDL Cholesterol (Calc): 82 mg/dL
Non-HDL Cholesterol (Calc): 102 mg/dL (ref <130)
Total CHOL/HDL Ratio: 2.7 (calc) (ref <5.0)
Triglycerides: 103 mg/dL (ref <150)

## 2023-08-17 LAB — CBC WITH DIFFERENTIAL/PLATELET
Absolute Lymphocytes: 1868 {cells}/uL (ref 850–3900)
Absolute Monocytes: 313 {cells}/uL (ref 200–950)
Basophils Absolute: 52 {cells}/uL (ref 0–200)
Basophils Relative: 0.9 %
Eosinophils Absolute: 162 {cells}/uL (ref 15–500)
Eosinophils Relative: 2.8 %
HCT: 41 % (ref 35.0–45.0)
Hemoglobin: 13.2 g/dL (ref 11.7–15.5)
MCH: 28.3 pg (ref 27.0–33.0)
MCHC: 32.2 g/dL (ref 32.0–36.0)
MCV: 88 fL (ref 80.0–100.0)
MPV: 10 fL (ref 7.5–12.5)
Monocytes Relative: 5.4 %
Neutro Abs: 3405 {cells}/uL (ref 1500–7800)
Neutrophils Relative %: 58.7 %
Platelets: 243 10*3/uL (ref 140–400)
RBC: 4.66 10*6/uL (ref 3.80–5.10)
RDW: 12.1 % (ref 11.0–15.0)
Total Lymphocyte: 32.2 %
WBC: 5.8 10*3/uL (ref 3.8–10.8)

## 2023-08-17 NOTE — Progress Notes (Signed)
 Lab only
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
## 2023-08-20 ENCOUNTER — Ambulatory Visit: Payer: No Typology Code available for payment source | Admitting: Internal Medicine

## 2023-08-20 ENCOUNTER — Encounter: Payer: Self-pay | Admitting: Internal Medicine

## 2023-08-20 ENCOUNTER — Other Ambulatory Visit: Payer: Self-pay

## 2023-08-20 ENCOUNTER — Telehealth: Payer: Self-pay | Admitting: Internal Medicine

## 2023-08-20 VITALS — BP 106/64 | HR 66 | Temp 97.0°F | Ht 67.0 in | Wt 216.8 lb

## 2023-08-20 DIAGNOSIS — E782 Mixed hyperlipidemia: Secondary | ICD-10-CM

## 2023-08-20 DIAGNOSIS — Z0000 Encounter for general adult medical examination without abnormal findings: Secondary | ICD-10-CM

## 2023-08-20 DIAGNOSIS — Z6833 Body mass index (BMI) 33.0-33.9, adult: Secondary | ICD-10-CM

## 2023-08-20 DIAGNOSIS — E559 Vitamin D deficiency, unspecified: Secondary | ICD-10-CM | POA: Diagnosis not present

## 2023-08-20 DIAGNOSIS — E038 Other specified hypothyroidism: Secondary | ICD-10-CM

## 2023-08-20 DIAGNOSIS — R109 Unspecified abdominal pain: Secondary | ICD-10-CM

## 2023-08-20 DIAGNOSIS — E039 Hypothyroidism, unspecified: Secondary | ICD-10-CM

## 2023-08-20 DIAGNOSIS — F419 Anxiety disorder, unspecified: Secondary | ICD-10-CM

## 2023-08-20 DIAGNOSIS — F32A Depression, unspecified: Secondary | ICD-10-CM

## 2023-08-20 DIAGNOSIS — Z9049 Acquired absence of other specified parts of digestive tract: Secondary | ICD-10-CM | POA: Diagnosis not present

## 2023-08-20 DIAGNOSIS — E538 Deficiency of other specified B group vitamins: Secondary | ICD-10-CM

## 2023-08-20 LAB — POCT URINALYSIS DIPSTICK
Bilirubin, UA: NEGATIVE
Glucose, UA: NEGATIVE
Ketones, UA: NEGATIVE
Leukocytes, UA: NEGATIVE
Nitrite, UA: NEGATIVE
Protein, UA: NEGATIVE
Spec Grav, UA: <=1.005 — AB (ref 1.010–1.025)
Urobilinogen, UA: 0.2 U/dL
pH, UA: 6 (ref 5.0–8.0)

## 2023-08-20 MED ORDER — MELOXICAM 15 MG PO TABS: 15.0000 mg | ORAL_TABLET | Freq: Every day | ORAL | 0 refills | Status: AC

## 2023-08-20 MED ORDER — SYNTHROID 100 MCG PO TABS: 100.0000 ug | ORAL_TABLET | Freq: Every day | ORAL | 1 refills | Status: DC

## 2023-08-20 NOTE — Telephone Encounter (Signed)
 Medication sent in and patient contacted and informed.

## 2023-08-20 NOTE — Telephone Encounter (Signed)
 Patient had visit today and meant to ask for refills on  SYNTHROID  100 MCG tablet  meloxicam  (MOBIC ) 15 MG tablet   She uses [facility] [phone] - [place], [place] - [id number]

## 2023-08-30 ENCOUNTER — Ambulatory Visit (INDEPENDENT_AMBULATORY_CARE_PROVIDER_SITE_OTHER): Payer: Self-pay | Admitting: Family Medicine

## 2023-08-30 ENCOUNTER — Encounter (INDEPENDENT_AMBULATORY_CARE_PROVIDER_SITE_OTHER): Payer: Self-pay | Admitting: Family Medicine

## 2023-08-30 ENCOUNTER — Encounter (INDEPENDENT_AMBULATORY_CARE_PROVIDER_SITE_OTHER): Payer: Self-pay

## 2023-08-30 VITALS — BP 116/77 | HR 58 | Temp 98.6°F | Ht 67.0 in | Wt 210.0 lb

## 2023-08-30 DIAGNOSIS — R632 Polyphagia: Secondary | ICD-10-CM

## 2023-08-30 DIAGNOSIS — E669 Obesity, unspecified: Secondary | ICD-10-CM

## 2023-08-30 DIAGNOSIS — E88819 Insulin resistance, unspecified: Secondary | ICD-10-CM | POA: Diagnosis not present

## 2023-08-30 DIAGNOSIS — Z6833 Body mass index (BMI) 33.0-33.9, adult: Secondary | ICD-10-CM

## 2023-08-30 DIAGNOSIS — Z6832 Body mass index (BMI) 32.0-32.9, adult: Secondary | ICD-10-CM

## 2023-08-30 DIAGNOSIS — E559 Vitamin D deficiency, unspecified: Secondary | ICD-10-CM | POA: Diagnosis not present

## 2023-08-30 MED ORDER — ZEPBOUND 2.5 MG/0.5ML ~~LOC~~ SOAJ: 2.5000 mg | SUBCUTANEOUS | 0 refills | Status: DC

## 2023-08-30 MED ORDER — ERGOCALCIFEROL 1.25 MG (50000 UT) PO CAPS: 50000.0000 [IU] | ORAL_CAPSULE | ORAL | 0 refills | Status: DC

## 2023-08-30 NOTE — Patient Instructions (Addendum)
 Labs are stable. Continue to work on diet, exercise and weight loss. Return in 6 months for lipid panel, OV and TSH.

## 2023-08-30 NOTE — Progress Notes (Signed)
 Office: [phone]  /  Fax: [phone]  WEIGHT SUMMARY AND BIOMETRICS  Anthropometric Measurements Height: 5\' 7"  (1.702 m) Weight: 210 lb (95.3 kg) BMI (Calculated): 32.88 Weight at Last Visit: 212 lb Weight Lost Since Last Visit: 2 lb Weight Gained Since Last Visit: 0 Starting Weight: 224 lb Total Weight Loss (lbs): 14 lb (6.35 kg) Peak Weight: 236 lb   Body Composition  Body Fat %: 42.4 % Fat Mass (lbs): 89.4 lbs Muscle Mass (lbs): 115.2 lbs Total Body Water (lbs): 79.8 lbs Visceral Fat Rating : 11   Other Clinical Data Fasting: no Labs: no Today's Visit #: 42 Starting Date: 06/18/20    Chief Complaint: OBESITY   Discussed the use of AI scribe software for clinical note transcription with the patient, who gave verbal consent to proceed.  History of Present Illness Tricia Miranda is a 58 year old female who presents for obesity treatment plan assessment and progress evaluation.  She adheres to a category two eating plan 90% of the time, with a calorie goal of 1300 to 1350 calories and 90 or more grams of protein. She exercises on a rowing machine for 60 to 90 minutes, four times a week, and has lost two pounds since her last appointment approximately one month ago.  She has a history of vitamin D  deficiency and is currently treated with prescription vitamin D  at a dose of 50,000 international units per week. She requests a refill of this medication.  She has a history of polyphagia and has been working on weight loss, which has been challenging. She recently started taking Zepbound  to help with her polyphagia and feels less hungry and more energetic, which facilitates exercise. She occasionally feels queasy if she goes too long without eating.  She has a history of hypothyroidism that developed after childbirth. She does not typically notice symptoms unless her medications need adjustment. She recalls losing weight very slowly in the past, about 15 years ago,  and maintaining it for a short period.  She has a history of elevated cholesterol and has been on pravastatin , which has significantly improved her cholesterol levels. She previously took Zetia , which helped minimally, and Crestor , which was effective but caused side effects.  She has a history of insulin  resistance with previously elevated insulin  levels. Recent lab work shows improved insulin  levels. She underwent a hysterectomy in 2020 and started hormone replacement therapy in October 2024. She is uncertain about her menopausal status due to the hysterectomy but notes she is likely on the later end of menopause.  She has experienced mild constipation over the past year, which was unusual for her, and underwent an x-ray and CT scan that confirmed mild constipation. She also had an appendectomy unrelated to these symptoms.      PHYSICAL EXAM:  Blood pressure 116/77, pulse (!) 58, temperature 98.6 F (37 C), height 5\' 7"  (1.702 m), weight 210 lb (95.3 kg), last menstrual period 11/15/2018, SpO2 97%. Body mass index is 32.89 kg/m.  DIAGNOSTIC DATA REVIEWED:  BMET    Component Value Date/Time   NA 140 08/17/2023 1011   NA 140 06/07/2023 1035   K 4.4 08/17/2023 1011   CL 106 08/17/2023 1011   CO2 28 08/17/2023 1011   GLUCOSE 79 08/17/2023 1011   BUN 13 08/17/2023 1011   BUN 17 06/07/2023 1035   CREATININE 0.56 08/17/2023 1011   CALCIUM  8.9 08/17/2023 1011   GFRNONAA >60 07/08/2022 1009   GFRNONAA 108 05/28/2020 1145   GFRAA  126 05/28/2020 1145   Lab Results  Component Value Date   HGBA1C 5.2 06/07/2023   HGBA1C 5.3 10/07/2010   Lab Results  Component Value Date   INSULIN  8.3 06/07/2023   INSULIN  6.9 06/18/2020   Lab Results  Component Value Date   TSH 1.39 08/17/2023   CBC    Component Value Date/Time   WBC 5.8 08/17/2023 1011   RBC 4.66 08/17/2023 1011   HGB 13.2 08/17/2023 1011   HCT 41.0 08/17/2023 1011   PLT 243 08/17/2023 1011   MCV 88.0 08/17/2023 1011    MCH 28.3 08/17/2023 1011   MCHC 32.2 08/17/2023 1011   RDW 12.1 08/17/2023 1011   Iron Studies    Component Value Date/Time   IRON 47 01/15/2017 0906   TIBC 448 01/15/2017 0906   FERRITIN 122 06/18/2020 1059   IRONPCTSAT 10 (L) 01/15/2017 0906   Lipid Panel     Component Value Date/Time   CHOL 161 08/17/2023 1011   CHOL 215 (H) 11/16/2022 1148   TRIG 103 08/17/2023 1011   HDL 59 08/17/2023 1011   HDL 50 11/16/2022 1148   CHOLHDL 2.7 08/17/2023 1011   VLDL 26 11/08/2015 1026   LDLCALC 82 08/17/2023 1011   Hepatic Function Panel     Component Value Date/Time   PROT 6.4 08/17/2023 1011   PROT 6.4 06/07/2023 1035   ALBUMIN 4.3 06/07/2023 1035   AST 12 08/17/2023 1011   ALT 13 08/17/2023 1011   ALKPHOS 93 06/07/2023 1035   BILITOT 0.5 08/17/2023 1011   BILITOT 0.6 06/07/2023 1035   BILIDIR 0.1 11/28/2019 1217   IBILI 0.5 11/28/2019 1217      Component Value Date/Time   TSH 1.39 08/17/2023 1011   Nutritional Lab Results  Component Value Date   VD25OH 46.5 06/07/2023   VD25OH 40.1 03/04/2023   VD25OH 43.9 11/16/2022     Assessment and Plan Assessment & Plan Obesity Obesity management focuses on weight loss through dietary modifications and exercise. She adheres to a category two eating plan 90% of the time and exercises on a rowing machine for 60 to 90 minutes, four times a week. She has lost two pounds since her last appointment one month ago. Zepbound  has reduced her hunger and increased her energy levels, facilitating exercise. Slow weight loss is emphasized to prevent metabolic slowdown. She experiences occasional queasiness when her stomach is empty. Adequate protein intake is crucial to prevent muscle breakdown and support metabolism. - Continue Zepbound  at current dose - Encourage continuation of category two eating plan - Encourage exercise regimen on rowing machine - Prioritize protein intake to meet dietary goals  Insulin  resistance Insulin  resistance  is managed effectively with lifestyle modifications and medication. Improved insulin  levels indicate better blood sugar control. The stages of insulin  resistance and the importance of lifestyle changes to prevent progression to diabetes were explained. Her current regimen has shown positive results in reducing insulin  levels and improving metabolic health. - Continue current lifestyle modifications and medication regimen  Polyphagia Polyphagia is managed with Zepbound , reducing hunger and increasing energy, facilitating exercise. Adequate protein intake is crucial to prevent muscle breakdown and support metabolism. Prioritizing protein intake, even with a slight increase in calorie consumption, helps maintain muscle mass and metabolic rate. - Continue Zepbound  at current dose - Prioritize protein intake to meet dietary goals  Vitamin D  deficiency Vitamin D  deficiency is managed with prescription vitamin D  supplementation. She requests a refill of her medication, 50,000 IU per week, to  maintain adequate vitamin D  levels. - Refill prescription for vitamin D  50,000 IU weekly   She was informed of the importance of frequent follow up visits to maximize her success with intensive lifestyle modifications for her multiple health conditions.    Jasmine Mesi, MD

## 2023-09-06 ENCOUNTER — Other Ambulatory Visit (INDEPENDENT_AMBULATORY_CARE_PROVIDER_SITE_OTHER): Payer: Self-pay | Admitting: Family Medicine

## 2023-09-06 DIAGNOSIS — E559 Vitamin D deficiency, unspecified: Secondary | ICD-10-CM

## 2023-09-17 ENCOUNTER — Other Ambulatory Visit: Payer: Self-pay | Admitting: Internal Medicine

## 2023-09-17 DIAGNOSIS — E785 Hyperlipidemia, unspecified: Secondary | ICD-10-CM

## 2023-09-25 ENCOUNTER — Other Ambulatory Visit (INDEPENDENT_AMBULATORY_CARE_PROVIDER_SITE_OTHER): Payer: Self-pay | Admitting: Family Medicine

## 2023-09-25 DIAGNOSIS — E559 Vitamin D deficiency, unspecified: Secondary | ICD-10-CM

## 2023-10-07 ENCOUNTER — Encounter (INDEPENDENT_AMBULATORY_CARE_PROVIDER_SITE_OTHER): Payer: Self-pay | Admitting: Family Medicine

## 2023-10-07 ENCOUNTER — Ambulatory Visit (INDEPENDENT_AMBULATORY_CARE_PROVIDER_SITE_OTHER): Admitting: Family Medicine

## 2023-10-07 VITALS — BP 105/68 | HR 60 | Temp 98.2°F | Ht 67.0 in | Wt 212.0 lb

## 2023-10-07 DIAGNOSIS — E669 Obesity, unspecified: Secondary | ICD-10-CM | POA: Diagnosis not present

## 2023-10-07 DIAGNOSIS — Z6833 Body mass index (BMI) 33.0-33.9, adult: Secondary | ICD-10-CM | POA: Diagnosis not present

## 2023-10-07 DIAGNOSIS — E559 Vitamin D deficiency, unspecified: Secondary | ICD-10-CM | POA: Diagnosis not present

## 2023-10-07 MED ORDER — ZEPBOUND 2.5 MG/0.5ML ~~LOC~~ SOAJ: 2.5000 mg | SUBCUTANEOUS | 0 refills | Status: DC

## 2023-10-07 MED ORDER — ERGOCALCIFEROL 1.25 MG (50000 UT) PO CAPS
50000.0000 [IU] | ORAL_CAPSULE | ORAL | 0 refills | Status: DC
Start: 2023-10-07 — End: 2023-11-03

## 2023-10-07 NOTE — Progress Notes (Signed)
   SUBJECTIVE:  Chief Complaint: Obesity  Interim History: Patient just got back from a week of vacation last night from [facility] [place] and is surprised with lack of weight loss.  She wasn't eating much and hiking 5-8 miles a day.  She is going to be local for the next few weeks.  She voices that she needs to go grocery shopping today.  She is trying to get in 4 days a week of exercise but normally gets in about 3 days a week of exercise.  Felt like she did better previously on Category 3.    Kaloni is here to discuss her progress with her obesity treatment plan. She is on the Category 2 Plan and states she is following her eating plan approximately 85 % of the time. She states she is exercising 2 1/2-3 hours daily 3-4 times per week.   OBJECTIVE: Visit Diagnoses: Problem List Items Addressed This Visit   None   No data recorded No data recorded No data recorded No data recorded   ASSESSMENT AND PLAN:  Diet: Nai is currently in the action stage of change. As such, her goal is to continue with weight loss efforts and has agreed to {MWMwtlossportion/plan2:23431}.   Exercise:  {MWM Exercise Recommendations:210964029}  Behavior Modification:  We discussed the following Behavioral Modification Strategies today: {HWW Behavior Modification:210964008}. We discussed various medication options to help [facility] with her weight loss efforts and we both agreed to ***.  No follow-ups on file.   She was informed of the importance of frequent follow up visits to maximize her success with intensive lifestyle modifications for her multiple health conditions.  Attestation Statements:   Reviewed by clinician on day of visit: allergies, medications, problem list, medical history, surgical history, family history, social history, and previous encounter notes.   Time spent on visit including pre-visit chart review and post-visit care and charting was *** minutes  Donaciano Frizzle, MD

## 2023-10-08 DIAGNOSIS — E669 Obesity, unspecified: Secondary | ICD-10-CM | POA: Insufficient documentation

## 2023-10-08 NOTE — Assessment & Plan Note (Signed)
 On prescription strength Vitamin D .  No nausea, vomiting or muscle weakness mentioned.  Needs a refill of Vitamin D  today.

## 2023-10-08 NOTE — Assessment & Plan Note (Signed)
 On zepbound  2.5mg  with no significant side effects.  Needs a refill of zepbound  today.  Will continue current dose and re-evaluate at next appointment medication effects.

## 2023-10-31 ENCOUNTER — Other Ambulatory Visit (INDEPENDENT_AMBULATORY_CARE_PROVIDER_SITE_OTHER): Payer: Self-pay | Admitting: Family Medicine

## 2023-10-31 DIAGNOSIS — E559 Vitamin D deficiency, unspecified: Secondary | ICD-10-CM

## 2023-11-03 ENCOUNTER — Telehealth (INDEPENDENT_AMBULATORY_CARE_PROVIDER_SITE_OTHER): Payer: Self-pay

## 2023-11-03 ENCOUNTER — Encounter (INDEPENDENT_AMBULATORY_CARE_PROVIDER_SITE_OTHER): Payer: Self-pay | Admitting: Family Medicine

## 2023-11-03 ENCOUNTER — Ambulatory Visit (INDEPENDENT_AMBULATORY_CARE_PROVIDER_SITE_OTHER): Admitting: Family Medicine

## 2023-11-03 VITALS — BP 101/71 | HR 68 | Temp 98.6°F | Ht 67.0 in | Wt 207.0 lb

## 2023-11-03 DIAGNOSIS — E669 Obesity, unspecified: Secondary | ICD-10-CM | POA: Diagnosis not present

## 2023-11-03 DIAGNOSIS — Z6832 Body mass index (BMI) 32.0-32.9, adult: Secondary | ICD-10-CM

## 2023-11-03 DIAGNOSIS — R632 Polyphagia: Secondary | ICD-10-CM

## 2023-11-03 DIAGNOSIS — E559 Vitamin D deficiency, unspecified: Secondary | ICD-10-CM | POA: Diagnosis not present

## 2023-11-03 MED ORDER — ERGOCALCIFEROL 1.25 MG (50000 UT) PO CAPS: 50000.0000 [IU] | ORAL_CAPSULE | ORAL | 0 refills | Status: DC

## 2023-11-03 MED ORDER — ZEPBOUND 2.5 MG/0.5ML ~~LOC~~ SOAJ: 2.5000 mg | SUBCUTANEOUS | 0 refills | Status: DC

## 2023-11-03 NOTE — Telephone Encounter (Signed)
 PA for Zepbound 2.5 has been submitted, awaiting PA questions.

## 2023-11-03 NOTE — Telephone Encounter (Signed)
 PA questions for Zepbound 2.5  have been answered and all documentation has been included. Waiting on a determination.

## 2023-11-03 NOTE — Progress Notes (Signed)
 Office: [phone]  /  Fax: [phone]  WEIGHT SUMMARY AND BIOMETRICS  Anthropometric Measurements Height: 5' 7 (1.702 m) Weight: 207 lb (93.9 kg) BMI (Calculated): 32.41 Weight at Last Visit: 212 lb Weight Lost Since Last Visit: 5 lb Weight Gained Since Last Visit: 0 Starting Weight: 224 lb Total Weight Loss (lbs): 17 lb (7.711 kg) Peak Weight: 236 lb   Body Composition  Body Fat %: 39.5 % Fat Mass (lbs): 82 lbs Muscle Mass (lbs): 119 lbs Total Body Water (lbs): 78.4 lbs Visceral Fat Rating : 10   Other Clinical Data Fasting: yes Labs: no Today's Visit #: 15 Starting Date: 06/18/20    Chief Complaint: OBESITY   Discussed the use of AI scribe software for clinical note transcription with the patient, who gave verbal consent to proceed.  History of Present Illness Tricia Miranda is a 58 year old female who presents for obesity treatment and progress assessment.  The patient reports following her category two obesity treatment plan approximately 90% of the time and engaging in cardio and strengthening exercises for 60 minutes, three to four times per week. Over the past month, she has lost five pounds since her last visit.  Her hunger levels are well-controlled, but she faces challenges due to work-related stress, leading to late work hours and suboptimal meal choices, such as protein bars for lunch and takeout dinners. She lives alone and is trying to balance cooking and exercising. She is considering using a prepared foods service to help manage her meals better.  She has started using creatine for muscle maintenance but finds the taste too sweet and is considering switching to an unflavored version. She is mindful of her protein intake, aiming for 35 grams or more per meal, and prefers to consume fewer calories at breakfast and more at dinner.  She is currently on hormone replacement therapy, which she started in September 2024, following a hysterectomy in  2020. She still has her ovaries and was initially in the early stages of perimenopause before starting the therapy.  She is taking Zepbound  for polyphagia, which is well-covered by her insurance, and she experiences minimal side effects. She is cautious about increasing the dosage due to potential side effects and prefers to maintain her current regimen.      PHYSICAL EXAM:  Blood pressure 101/71, pulse 68, temperature 98.6 F (37 C), height 5' 7 (1.702 m), weight 207 lb (93.9 kg), last menstrual period 11/15/2018, SpO2 96%. Body mass index is 32.42 kg/m.  DIAGNOSTIC DATA REVIEWED:  BMET    Component Value Date/Time   NA 140 08/17/2023 1011   NA 140 06/07/2023 1035   K 4.4 08/17/2023 1011   CL 106 08/17/2023 1011   CO2 28 08/17/2023 1011   GLUCOSE 79 08/17/2023 1011   BUN 13 08/17/2023 1011   BUN 17 06/07/2023 1035   CREATININE 0.56 08/17/2023 1011   CALCIUM  8.9 08/17/2023 1011   GFRNONAA >60 07/08/2022 1009   GFRNONAA 108 05/28/2020 1145   GFRAA 126 05/28/2020 1145   Lab Results  Component Value Date   HGBA1C 5.2 06/07/2023   HGBA1C 5.3 10/07/2010   Lab Results  Component Value Date   INSULIN  8.3 06/07/2023   INSULIN  6.9 06/18/2020   Lab Results  Component Value Date   TSH 1.39 08/17/2023   CBC    Component Value Date/Time   WBC 5.8 08/17/2023 1011   RBC 4.66 08/17/2023 1011   HGB 13.2 08/17/2023 1011   HCT 41.0 08/17/2023  1011   PLT 243 08/17/2023 1011   MCV 88.0 08/17/2023 1011   MCH 28.3 08/17/2023 1011   MCHC 32.2 08/17/2023 1011   RDW 12.1 08/17/2023 1011   Iron Studies    Component Value Date/Time   IRON 47 01/15/2017 0906   TIBC 448 01/15/2017 0906   FERRITIN 122 06/18/2020 1059   IRONPCTSAT 10 (L) 01/15/2017 0906   Lipid Panel     Component Value Date/Time   CHOL 161 08/17/2023 1011   CHOL 215 (H) 11/16/2022 1148   TRIG 103 08/17/2023 1011   HDL 59 08/17/2023 1011   HDL 50 11/16/2022 1148   CHOLHDL 2.7 08/17/2023 1011   VLDL 26  11/08/2015 1026   LDLCALC 82 08/17/2023 1011   Hepatic Function Panel     Component Value Date/Time   PROT 6.4 08/17/2023 1011   PROT 6.4 06/07/2023 1035   ALBUMIN 4.3 06/07/2023 1035   AST 12 08/17/2023 1011   ALT 13 08/17/2023 1011   ALKPHOS 93 06/07/2023 1035   BILITOT 0.5 08/17/2023 1011   BILITOT 0.6 06/07/2023 1035   BILIDIR 0.1 11/28/2019 1217   IBILI 0.5 11/28/2019 1217      Component Value Date/Time   TSH 1.39 08/17/2023 1011   Nutritional Lab Results  Component Value Date   VD25OH 46.5 06/07/2023   VD25OH 40.1 03/04/2023   VD25OH 43.9 11/16/2022     Assessment and Plan Assessment & Plan Obesity Obesity management is the primary focus. She adheres to her category two plan 90% of the time and engages in regular exercise, including cardio and strengthening exercises for 60 minutes, three to four times per week. She has lost five pounds in the last month. Occasional challenges with meal preparation due to her work schedule are noted, but she is making efforts to choose healthier options. Discussion included the use of meal prep services and the importance of nutritional information accuracy, noting that legally, nutritional information can be 10% off on calories. She is postmenopausal, with visceral fat at goal, but fat percentage slightly above ideal. Muscle mass is adequate compared to fat percentage. She is on Zepbound , effectively controlling hunger without significant side effects. The importance of maintaining appropriate hunger signals to avoid malnourishment and muscle mass loss was emphasized. Concerns about muscle mass loss and malnourishment with GLP-1 medications were discussed, highlighting the need to avoid overmedication. - Continue Zepbound  at current dose - Continue exercise regimen - Maintain journaling with a 1500 calorie goal and 85 or more grams of protein - Consider meal prep services with accurate nutritional information  Polyphagia Zepbound  is  used to help control excessive hunger. She reports occasional queasiness but no significant side effects. The importance of maintaining appropriate hunger signals was discussed to prevent malnourishment and muscle mass loss. The potential for an oral form of the medication was mentioned, with a best-case scenario of availability next year. - Continue Zepbound  at current dose  Vitamin D  deficiency Vitamin D  deficiency requires management. - Refill vitamin D  prescription  General Health Maintenance Discussion included the importance of protein intake, particularly front-loading protein at breakfast to aid muscle building. Studies show that consuming 30 grams or more of protein at breakfast enhances muscle building. Creatine supplementation was discussed, with potential benefits for muscle maintenance and memory in postmenopausal women. She is advised to seek unflavored creatine options if desired. - Consider creatine supplementation with unflavored options - Ensure protein intake of 30 grams or more at breakfast  She was informed of the importance of frequent follow up visits to maximize her success with intensive lifestyle modifications for her multiple health conditions.    Louann Penton, MD

## 2023-11-09 NOTE — Telephone Encounter (Signed)
 [facility] recently received a prior authorization request from your doctor for coverage of Zepbound  Injection (tirzepatide ) for you.  After reviewing your prescription benefit plan's established and approved criteria for this drug with your doctor, the request was approved for the following time period:  11/04/2023 - 11/03/2024*

## 2023-12-01 ENCOUNTER — Other Ambulatory Visit (INDEPENDENT_AMBULATORY_CARE_PROVIDER_SITE_OTHER): Payer: Self-pay | Admitting: Family Medicine

## 2023-12-01 DIAGNOSIS — E559 Vitamin D deficiency, unspecified: Secondary | ICD-10-CM

## 2023-12-02 ENCOUNTER — Telehealth: Payer: Self-pay

## 2023-12-02 ENCOUNTER — Ambulatory Visit (INDEPENDENT_AMBULATORY_CARE_PROVIDER_SITE_OTHER): Admitting: Adult Health

## 2023-12-02 ENCOUNTER — Encounter (INDEPENDENT_AMBULATORY_CARE_PROVIDER_SITE_OTHER): Payer: Self-pay | Admitting: Adult Health

## 2023-12-02 VITALS — BP 100/68 | HR 67 | Temp 98.5°F | Ht 67.0 in | Wt 207.0 lb

## 2023-12-02 DIAGNOSIS — E88819 Insulin resistance, unspecified: Secondary | ICD-10-CM

## 2023-12-02 DIAGNOSIS — E669 Obesity, unspecified: Secondary | ICD-10-CM | POA: Diagnosis not present

## 2023-12-02 DIAGNOSIS — Z6833 Body mass index (BMI) 33.0-33.9, adult: Secondary | ICD-10-CM

## 2023-12-02 DIAGNOSIS — Z6832 Body mass index (BMI) 32.0-32.9, adult: Secondary | ICD-10-CM | POA: Diagnosis not present

## 2023-12-02 DIAGNOSIS — E559 Vitamin D deficiency, unspecified: Secondary | ICD-10-CM | POA: Diagnosis not present

## 2023-12-02 MED ORDER — ERGOCALCIFEROL 1.25 MG (50000 UT) PO CAPS: 50000.0000 [IU] | ORAL_CAPSULE | ORAL | 0 refills | Status: DC

## 2023-12-02 MED ORDER — TIRZEPATIDE-WEIGHT MANAGEMENT 5 MG/0.5ML ~~LOC~~ SOLN: 5.0000 mg | SUBCUTANEOUS | 0 refills | Status: DC

## 2023-12-02 NOTE — Progress Notes (Addendum)
 WEIGHT SUMMARY AND BIOMETRICS  Vitals Temp: 98.5 F (36.9 C) BP: 100/68 Pulse Rate: 67 SpO2: 97 %   Anthropometric Measurements Height: 5' 7 (1.702 m) Weight: 207 lb (93.9 kg) BMI (Calculated): 32.41 Weight at Last Visit: 207lb Weight Lost Since Last Visit: 0lb Weight Gained Since Last Visit: 0lb Starting Weight: 224lb Total Weight Loss (lbs): 17 lb (7.711 kg) Peak Weight: 236lb   Body Composition  Body Fat %: 40.4 % Fat Mass (lbs): 83.6 lbs Muscle Mass (lbs): 117.2 lbs Total Body Water (lbs): 78.4 lbs Visceral Fat Rating : 11   Other Clinical Data Fasting: Yes Labs: No Today's Visit #: 45 Starting Date: 06/18/20    Chief Complaint:   OBESITY Tricia Miranda is here to discuss her progress with her obesity treatment plan.  She is on the the Category 2 Plan and states she is following her eating plan approximately 90-95 % of the time.  She states she is exercising Walking 45-60 minutes 3-4 times per week.  Interim History:  She recently returned from vacation at [employer]. She was up [place] for 8 days and estimates to have walked 3,000-8,000 steps per day  She started on weekly loading dose of Zepbound 2.5mg   on/about 27/2025 Denies mass in neck, dysphagia, dyspepsia, persistent hoarseness, abdominal pain, or N/V/C   Of Note- She denies family hx of MEN 2 or MTC She denies personal hx of pancreatitis She has had hysterectomy  Subjective:   1. Insulin resistance Lab Results  Component Value Date   HGBA1C 5.2 06/07/2023   HGBA1C 5.2 02/22/2023   HGBA1C 5.2 11/16/2022     Latest Reference Range & Units 11/16/22 11:48 06/07/23 10:35  INSULIN 2.6 - 24.9 uIU/mL 16.4 8.3   She started on weekly loading dose of Zepbound 2.5mg   on/about 27/2025 Denies mass in neck, dysphagia, dyspepsia, persistent hoarseness, abdominal pain, or N/V/C   Of Note- She denies family hx of MEN 2 or MTC She denies personal hx of pancreatitis She has had  hysterectomy  2. Vitamin D deficiency  Latest Reference Range & Units 11/16/22 11:48 03/04/23 13:05 06/07/23 10:35  Vitamin D, 25-Hydroxy 30.0 - 100.0 ng/mL 43.9 40.1 46.5   She is on weekly Ergocalciferol- denies N/V/Muscle Weakness  Assessment/Plan:  1. Insulin resistance (Primary) Refill and INCREASE  2. Vitamin D deficiency Refill - ergocalciferol (DRISDOL) 1.25 MG (50000 UT) capsule; Take 1 capsule (50,000 Units total) by mouth once a week.  Dispense: 4 capsule; Refill: 0  3. Obesity, CURRENT BMI 32.4  Tricia Miranda is currently in the action stage of change. As such, her goal is to continue with weight loss efforts. She has agreed to the Category 2 Plan.   Exercise goals: All adults should avoid inactivity. Some physical activity is better than none, and adults who participate in any amount of physical activity gain some health benefits. Adults should also include muscle-strengthening activities that involve all major muscle groups on 2 or more days a week.  Behavioral modification strategies: increasing lean protein intake, decreasing simple carbohydrates, increasing vegetables, increasing water intake, no skipping meals, meal planning and cooking strategies, keeping healthy foods in the home, ways to avoid boredom eating, and planning for success.  Tricia Miranda has agreed to follow-up with our clinic in 4 weeks. She was informed of the importance of frequent follow-up visits to maximize her success with intensive lifestyle modifications for her multiple health conditions.   Objective:   Blood pressure 100/68, pulse 67, temperature 98.5 F (36.9  C), height 5' 7 (1.702 m), weight 207 lb (93.9 kg), last menstrual period 11/15/2018, SpO2 97%. Body mass index is 32.42 kg/m.  General: Cooperative, alert, well developed, in no acute distress. HEENT: Conjunctivae and lids unremarkable. Cardiovascular: Regular rhythm.  Lungs: Normal work of breathing. Neurologic: No focal deficits.   Lab  Results  Component Value Date   CREATININE 0.56 08/17/2023   BUN 13 08/17/2023   NA 140 08/17/2023   K 4.4 08/17/2023   CL 106 08/17/2023   CO2 28 08/17/2023   Lab Results  Component Value Date   ALT 13 08/17/2023   AST 12 08/17/2023   ALKPHOS 93 06/07/2023   BILITOT 0.5 08/17/2023   Lab Results  Component Value Date   HGBA1C 5.2 06/07/2023   HGBA1C 5.2 02/22/2023   HGBA1C 5.2 11/16/2022   HGBA1C 5.3 05/07/2022   HGBA1C 5.1 05/20/2021   Lab Results  Component Value Date   INSULIN  8.3 06/07/2023   INSULIN  16.4 11/16/2022   INSULIN  13.1 05/07/2022   INSULIN  9.2 05/20/2021   INSULIN  6.9 06/18/2020   Lab Results  Component Value Date   TSH 1.39 08/17/2023   Lab Results  Component Value Date   CHOL 161 08/17/2023   HDL 59 08/17/2023   LDLCALC 82 08/17/2023   TRIG 103 08/17/2023   CHOLHDL 2.7 08/17/2023   Lab Results  Component Value Date   VD25OH 46.5 06/07/2023   VD25OH 40.1 03/04/2023   VD25OH 43.9 11/16/2022   Lab Results  Component Value Date   WBC 5.8 08/17/2023   HGB 13.2 08/17/2023   HCT 41.0 08/17/2023   MCV 88.0 08/17/2023   PLT 243 08/17/2023   Lab Results  Component Value Date   IRON 47 01/15/2017   TIBC 448 01/15/2017   FERRITIN 122 06/18/2020   Attestation Statements:   Reviewed by clinician on day of visit: allergies, medications, problem list, medical history, surgical history, family history, social history, and previous encounter notes.  I have reviewed the above documentation for accuracy and completeness, and I agree with the above. -  Yarianna Varble d. Sydney Azure, NP-C

## 2023-12-02 NOTE — Telephone Encounter (Signed)
VOB submitted for durolane, bilateral knee  

## 2023-12-08 ENCOUNTER — Telehealth (INDEPENDENT_AMBULATORY_CARE_PROVIDER_SITE_OTHER): Payer: Self-pay | Admitting: *Deleted

## 2023-12-08 NOTE — Telephone Encounter (Signed)
 Message from Plan [employer] request has been denied. Additional information will be provided in the denial communication. (Message [phone]) Patient has been updated thru MyChart

## 2023-12-21 ENCOUNTER — Telehealth: Payer: Self-pay

## 2023-12-21 NOTE — Telephone Encounter (Signed)
 Patient scheduled for Durolane bilateral knee B/B no copay

## 2023-12-29 ENCOUNTER — Other Ambulatory Visit (INDEPENDENT_AMBULATORY_CARE_PROVIDER_SITE_OTHER): Payer: Self-pay | Admitting: Adult Health

## 2023-12-29 DIAGNOSIS — E559 Vitamin D deficiency, unspecified: Secondary | ICD-10-CM

## 2023-12-30 ENCOUNTER — Ambulatory Visit (INDEPENDENT_AMBULATORY_CARE_PROVIDER_SITE_OTHER): Admitting: Adult Health

## 2023-12-30 VITALS — BP 125/84 | HR 75 | Temp 98.5°F | Ht 67.0 in | Wt 205.0 lb

## 2023-12-30 DIAGNOSIS — E559 Vitamin D deficiency, unspecified: Secondary | ICD-10-CM | POA: Diagnosis not present

## 2023-12-30 DIAGNOSIS — E782 Mixed hyperlipidemia: Secondary | ICD-10-CM

## 2023-12-30 DIAGNOSIS — Z6832 Body mass index (BMI) 32.0-32.9, adult: Secondary | ICD-10-CM

## 2023-12-30 DIAGNOSIS — E038 Other specified hypothyroidism: Secondary | ICD-10-CM | POA: Diagnosis not present

## 2023-12-30 DIAGNOSIS — E88819 Insulin resistance, unspecified: Secondary | ICD-10-CM

## 2023-12-30 DIAGNOSIS — Z6833 Body mass index (BMI) 33.0-33.9, adult: Secondary | ICD-10-CM

## 2023-12-30 MED ORDER — ERGOCALCIFEROL 1.25 MG (50000 UT) PO CAPS: 50000.0000 [IU] | ORAL_CAPSULE | ORAL | 0 refills | Status: DC

## 2023-12-30 MED ORDER — TIRZEPATIDE-WEIGHT MANAGEMENT 5 MG/0.5ML ~~LOC~~ SOLN: 5.0000 mg | SUBCUTANEOUS | 0 refills | Status: DC

## 2023-12-30 NOTE — Progress Notes (Signed)
 WEIGHT SUMMARY AND BIOMETRICS  Vitals Temp: 98.5 F (36.9 C) BP: 125/84 Pulse Rate: 75 SpO2: 96 %   Anthropometric Measurements Height: 5' 7 (1.702 m) Weight: 205 lb (93 kg) BMI (Calculated): 32.1 Weight at Last Visit: 207 lb Weight Lost Since Last Visit: 2 lb Weight Gained Since Last Visit: 0 Starting Weight: 224 lb Total Weight Loss (lbs): 19 lb (8.618 kg) Peak Weight: 236 lb   Body Composition  Body Fat %: 40.4 % Fat Mass (lbs): 83 lbs Muscle Mass (lbs): 116.2 lbs Total Body Water (lbs): 77.2 lbs Visceral Fat Rating : 11   Other Clinical Data Fasting: yes Labs: no Today's Visit #: 80 Starting Date: 06/18/20    Chief Complaint:   OBESITY Tricia Miranda is here to discuss her progress with her obesity treatment plan.  She is on the the Category 2 Plan and states she is following her eating plan approximately 85-90 % of the time.  She states she is exercising Hiking/Biking 10 miles/30 minutes 3-4 times per week.  Interim History:  She started on weekly loading dose of Zepbound  2.5mg   on/about 05/28/2023 12/02/2023 Zepbound  2.5mg  increased 5mg   Denies mass in neck, dysphagia, dyspepsia, persistent hoarseness, or N/V/C   She reports stable appetite  She reports intermittent, diffuse coldness over her entire body. This will occur several times per week and then self resolve. Last several TSH levels have been normal. PCP manages daily Levothyroxine  100mcg  She has also noticed intermittent, brief, localized abdominal pain, that will self resolve. She will notice this in evening when going to bed. Her last C Scope was 2018- recommended repeat study in 10 years. She denies family hx of colon cancer She denies change in bowel habits or hematochezia  Subjective:   1. Mixed hyperlipidemia Lipid Panel     Component Value Date/Time   CHOL 161 08/17/2023 1011   CHOL 215 (H) 11/16/2022 1148   TRIG 103 08/17/2023 1011   HDL 59 08/17/2023 1011   HDL 50  11/16/2022 1148   CHOLHDL 2.7 08/17/2023 1011   VLDL 26 11/08/2015 1026   LDLCALC 82 08/17/2023 1011   LABVLDL 32 11/16/2022 1148    She started on weekly loading dose of Zepbound  2.5mg   on/about 05/28/2023 12/02/2023 Zepbound  2.5mg  increased 5mg   Denies mass in neck, dysphagia, dyspepsia, persistent hoarseness, or N/V/C   Cardiology manages daily Pravachol  40mg   2. Insulin  resistance She started on weekly loading dose of Zepbound  2.5mg   on/about 05/28/2023 12/02/2023 Zepbound  2.5mg  increased 5mg   Denies mass in neck, dysphagia, dyspepsia, persistent hoarseness, or N/V/C   3. Other specified hypothyroidism  Latest Reference Range & Units 08/03/22 10:07 11/16/22 11:48 02/22/23 09:28 06/07/23 10:35 08/17/23 10:11  TSH 0.40 - 4.50 mIU/L 0.68 1.140 2.04 1.480 1.39   PCP managed daily Levothyroxine  100mcg  4. Vitamin D  deficiency  Latest Reference Range & Units 11/16/22 11:48 03/04/23 13:05 06/07/23 10:35  Vitamin D , 25-Hydroxy 30.0 - 100.0 ng/mL 43.9 40.1 46.5   Assessment/Plan:   1. Mixed hyperlipidemia Continue healthy eating and regular exercise  2. Insulin  resistance (Primary) Continue healthy eating and regular exercise  3. Other specified hypothyroidism Continue current Levothyroxine  dose per PCP  4. Vitamin D  deficiency Refill - ergocalciferol  (DRISDOL ) 1.25 MG (50000 UT) capsule; Take 1 capsule (50,000 Units total) by mouth once a week.  Dispense: 4 capsule; Refill: 0  5. BMI 33.0-33.9,adult, CURRENT BMI 32.2 Refill tirzepatide  5 MG/0.5ML injection vial Inject 5 mg into the skin once a week. Dispense:  2 mL, Refills: 0 ordered   Lekesha is currently in the action stage of change. As such, her goal is to continue with weight loss efforts. She has agreed to the Category 2 Plan.   Exercise goals: For substantial health benefits, adults should do at least 150 minutes (2 hours and 30 minutes) a week of moderate-intensity, or 75 minutes (1 hour and 15 minutes) a week of  vigorous-intensity aerobic physical activity, or an equivalent combination of moderate- and vigorous-intensity aerobic activity. Aerobic activity should be performed in episodes of at least 10 minutes, and preferably, it should be spread throughout the week.  Behavioral modification strategies: increasing lean protein intake, decreasing simple carbohydrates, increasing vegetables, increasing water intake, no skipping meals, meal planning and cooking strategies, keeping healthy foods in the home, and planning for success.  Pluma has agreed to follow-up with our clinic in 4 weeks. She was informed of the importance of frequent follow-up visits to maximize her success with intensive lifestyle modifications for her multiple health conditions.   Check Fasting Labs at next OV  Objective:   Blood pressure 125/84, pulse 75, temperature 98.5 F (36.9 C), height 5' 7 (1.702 m), weight 205 lb (93 kg), last menstrual period 11/15/2018, SpO2 96%. Body mass index is 32.11 kg/m.  General: Cooperative, alert, well developed, in no acute distress. HEENT: Conjunctivae and lids unremarkable. Cardiovascular: Regular rhythm.  Lungs: Normal work of breathing. Neurologic: No focal deficits.   Lab Results  Component Value Date   CREATININE 0.56 08/17/2023   BUN 13 08/17/2023   NA 140 08/17/2023   K 4.4 08/17/2023   CL 106 08/17/2023   CO2 28 08/17/2023   Lab Results  Component Value Date   ALT 13 08/17/2023   AST 12 08/17/2023   ALKPHOS 93 06/07/2023   BILITOT 0.5 08/17/2023   Lab Results  Component Value Date   HGBA1C 5.2 06/07/2023   HGBA1C 5.2 02/22/2023   HGBA1C 5.2 11/16/2022   HGBA1C 5.3 05/07/2022   HGBA1C 5.1 05/20/2021   Lab Results  Component Value Date   INSULIN  8.3 06/07/2023   INSULIN  16.4 11/16/2022   INSULIN  13.1 05/07/2022   INSULIN  9.2 05/20/2021   INSULIN  6.9 06/18/2020   Lab Results  Component Value Date   TSH 1.39 08/17/2023   Lab Results  Component Value Date    CHOL 161 08/17/2023   HDL 59 08/17/2023   LDLCALC 82 08/17/2023   TRIG 103 08/17/2023   CHOLHDL 2.7 08/17/2023   Lab Results  Component Value Date   VD25OH 46.5 06/07/2023   VD25OH 40.1 03/04/2023   VD25OH 43.9 11/16/2022   Lab Results  Component Value Date   WBC 5.8 08/17/2023   HGB 13.2 08/17/2023   HCT 41.0 08/17/2023   MCV 88.0 08/17/2023   PLT 243 08/17/2023   Lab Results  Component Value Date   IRON 47 01/15/2017   TIBC 448 01/15/2017   FERRITIN 122 06/18/2020   Attestation Statements:   Reviewed by clinician on day of visit: allergies, medications, problem list, medical history, surgical history, family history, social history, and previous encounter notes.  I have reviewed the above documentation for accuracy and completeness, and I agree with the above. -  Calissa Swenor d. Eulia Hatcher, NP-C

## 2024-01-12 ENCOUNTER — Ambulatory Visit (INDEPENDENT_AMBULATORY_CARE_PROVIDER_SITE_OTHER): Payer: Self-pay | Admitting: Orthopedic Surgery

## 2024-01-12 ENCOUNTER — Other Ambulatory Visit (INDEPENDENT_AMBULATORY_CARE_PROVIDER_SITE_OTHER): Payer: Self-pay

## 2024-01-12 DIAGNOSIS — M170 Bilateral primary osteoarthritis of knee: Secondary | ICD-10-CM

## 2024-01-12 DIAGNOSIS — M25562 Pain in left knee: Secondary | ICD-10-CM

## 2024-01-12 DIAGNOSIS — M25561 Pain in right knee: Secondary | ICD-10-CM

## 2024-01-12 DIAGNOSIS — M1711 Unilateral primary osteoarthritis, right knee: Secondary | ICD-10-CM

## 2024-01-12 DIAGNOSIS — M1712 Unilateral primary osteoarthritis, left knee: Secondary | ICD-10-CM

## 2024-01-12 MED ORDER — HYDROXYZINE HCL 10 MG PO TABS
ORAL_TABLET | ORAL | 0 refills | Status: DC
Start: 2024-01-12 — End: 2024-01-24

## 2024-01-15 ENCOUNTER — Encounter: Payer: Self-pay | Admitting: Orthopedic Surgery

## 2024-01-15 MED ORDER — SODIUM HYALURONATE 60 MG/3ML IX PRSY
60.0000 mg | PREFILLED_SYRINGE | INTRA_ARTICULAR | Status: AC | PRN
  Administered 2024-01-12: 60 mg via INTRA_ARTICULAR

## 2024-01-15 MED ORDER — LIDOCAINE HCL 1 % IJ SOLN
5.0000 mL | INTRAMUSCULAR | Status: AC | PRN
  Administered 2024-01-12: 5 mL

## 2024-01-15 NOTE — Progress Notes (Signed)
   Procedure Note  Patient: Tricia Miranda             Date of Birth: Jan 26, 1966           MRN: [id number]             Visit Date: 01/12/2024  Procedures: Visit Diagnoses:  1. Pain in both knees, unspecified chronicity     Large Joint Inj: R knee on 01/12/2024 8:02 PM Indications: diagnostic evaluation, joint swelling and pain Details: 18 G 1.5 in needle, superolateral approach  Arthrogram: No  Medications: 5 mL lidocaine  1 %; 60 mg Sodium Hyaluronate 60 MG/3ML Outcome: tolerated well, no immediate complications Procedure, treatment alternatives, risks and benefits explained, specific risks discussed. Consent was given by the patient. Immediately prior to procedure a time out was called to verify the correct patient, procedure, equipment, support staff and site/side marked as required. Patient was prepped and draped in the usual sterile fashion.    Large Joint Inj: L knee on 01/12/2024 8:03 PM Indications: diagnostic evaluation, joint swelling and pain Details: 18 G 1.5 in needle, superolateral approach  Arthrogram: No  Medications: 5 mL lidocaine  1 %; 60 mg Sodium Hyaluronate 60 MG/3ML Outcome: tolerated well, no immediate complications Procedure, treatment alternatives, risks and benefits explained, specific risks discussed. Consent was given by the patient. Immediately prior to procedure a time out was called to verify the correct patient, procedure, equipment, support staff and site/side marked as required. Patient was prepped and draped in the usual sterile fashion.    Lot #[id number] Patient has been doing well with gel injections.  Recently hiked 10 miles in [facility].  Going on beach walks as well.  May have had an intolerance reaction to prior injection.  Atarax  prescribed.  3 views bilateral knees today show minimal progression of very mild arthritis compared to 5 years ago. This patient is diagnosed with osteoarthritis of the knee(s).    Radiographs show  evidence of joint space narrowing, osteophytes, subchondral sclerosis and/or subchondral cysts.  This patient has knee pain which interferes with functional and activities of daily living.    This patient has experienced inadequate response, adverse effects and/or intolerance with conservative treatments such as acetaminophen , NSAIDS, topical creams, physical therapy or regular exercise, knee bracing and/or weight loss.   This patient has experienced inadequate response or has a contraindication to intra articular steroid injections for at least 3 months.   This patient is not scheduled to have a total knee replacement within 6 months of starting treatment with viscosupplementation.

## 2024-01-24 ENCOUNTER — Ambulatory Visit: Payer: No Typology Code available for payment source | Admitting: Obstetrics and Gynecology

## 2024-01-24 ENCOUNTER — Encounter: Payer: Self-pay | Admitting: Obstetrics and Gynecology

## 2024-01-24 VITALS — BP 118/78 | HR 66 | Ht 66.75 in | Wt 205.0 lb

## 2024-01-24 DIAGNOSIS — Z1331 Encounter for screening for depression: Secondary | ICD-10-CM | POA: Diagnosis not present

## 2024-01-24 DIAGNOSIS — Z01419 Encounter for gynecological examination (general) (routine) without abnormal findings: Secondary | ICD-10-CM

## 2024-01-24 DIAGNOSIS — Z13820 Encounter for screening for osteoporosis: Secondary | ICD-10-CM

## 2024-01-24 MED ORDER — ESTRADIOL 0.025 MG/24HR TD PTTW: 1.0000 | MEDICATED_PATCH | TRANSDERMAL | 3 refills | Status: AC

## 2024-01-24 NOTE — Progress Notes (Unsigned)
 58 y.o. G27P1001 Divorced Caucasian female here for annual exam.    Likes the estradiol  patch.   Has gone back and forth on dosage of Vivelle  Dot 0.0375 and 0.025 mg.  She prefers the lower dosage.   May be some fluid retention in her legs.  She had some hot flashes and joint pain prior to starting ERT.    Hx traumatic ankle fracture and humerus fracture.   Taking Zepbound .    PCP: Perri Ronal PARAS, MD   Patient's last menstrual period was 11/15/2018.           Sexually active: Yes  The current method of family planning is status post hysterectomy.    Menopausal hormone therapy:  Vivelle   Exercising: Yes.    Swimming,rowing machines, pilates, walking  Smoker:  no  OB History  Gravida Para Term Preterm AB Living  1 1 1   1   SAB IAB Ectopic Multiple Live Births          # Outcome Date GA Lbr Len/2nd Weight Sex Type Anes PTL Lv  1 Term              HEALTH MAINTENANCE: Last 2 paps:  11/29/17 ASCUS. HR HPV neg History of abnormal Pap or positive HPV:  Yes Mammogram:   12/23/22 Breast Density Cat C, BIRADS Cat 1 neg.  She will schedule.   Colonoscopy:  03/22/17 Bone Density:  n/a  Result  n/a   Immunization History  Administered Date(s) Administered   Influenza, Seasonal, Injecte, Preservative Fre 02/26/2023   Influenza,inj,Quad PF,6+ Mos 01/20/2013, 03/06/2014, 01/18/2017, 02/18/2018, 01/04/2019, 01/05/2020   Influenza,inj,quad, With Preservative 01/26/2022   Influenza-Unspecified 02/28/2015, 02/10/2021   Moderna Sars-Covid-2 Vaccination 06/21/2019, 07/20/2019, 03/14/2020, 09/13/2020, 03/24/2021   Tdap 01/05/2006, 05/02/2018   Unspecified SARS-COV-2 Vaccination 02/08/2022   Zoster Recombinant(Shingrix) 05/30/2022      reports that she has never smoked. She has never used smokeless tobacco. She reports current alcohol use. She reports that she does not use drugs.  Past Medical History:  Diagnosis Date   Anxiety    Anxiety    Bronchitis    Cancer (HCC)    basal cell-  tiny   Cancer (HCC)    Fibroids    uterine    High cholesterol    Hypothyroidism    Joint pain    Low iron    Low vitamin D  level    Osteoarthritis    PONV (postoperative nausea and vomiting)    Sleep apnea    uses oral device for mild sleep apnea    Thyroid disease    hypothyroidism   Vitamin B deficiency     Past Surgical History:  Procedure Laterality Date   ABDOMINAL HYSTERECTOMY     APPENDECTOMY  06/2022   COLONOSCOPY     with benign  polyp removal    DILITATION & CURRETTAGE/HYSTROSCOPY WITH VERSAPOINT RESECTION N/A 03/16/2014   Procedure: DILATATION & CURETTAGE/HYSTEROSCOPY WITH VERSAPOINT RESECTION;  Surgeon: Marie-Lyne Lavoie, MD;  Location: [facility];  Service: Gynecology;  Laterality: N/A;  1 hr.   HYSTERECTOMY ABDOMINAL WITH SALPINGECTOMY Bilateral 11/15/2018   Procedure: HYSTERECTOMY ABDOMINAL WITH  BILATERAL SALPINGECTOMY;  Surgeon: Lavoie, Marie-Lyne, MD;  Location: [facility];  Service: Gynecology;  Laterality: Bilateral;   LAPAROSCOPIC APPENDECTOMY N/A 07/08/2022   Procedure: APPENDECTOMY LAPAROSCOPIC;  Surgeon: Ebbie Cough, MD;  Location: [facility] OR;  Service: General;  Laterality: N/A;   MOHS SURGERY Left a while- unknown   under left eyelid   WISDOM  TOOTH EXTRACTION      Current Outpatient Medications  Medication Sig Dispense Refill   ALPRAZolam  (XANAX ) 0.25 MG tablet TAKE ONE TABLET BY MOUTH TWICE A DAY AS NEEDED 60 tablet 3   ergocalciferol  (DRISDOL ) 1.25 MG (50000 UT) capsule Take 1 capsule (50,000 Units total) by mouth once a week. 4 capsule 0   estradiol  (VIVELLE -DOT) 0.0375 MG/24HR      ezetimibe  (ZETIA ) 10 MG tablet TAKE 1 TABLET BY MOUTH DAILY 90 tablet 3   FLUoxetine  (PROZAC ) 20 MG capsule Take 20 mg by mouth daily.     meloxicam  (MOBIC ) 15 MG tablet Take 1 tablet (15 mg total) by mouth daily. 90 tablet 0   methylcellulose (CITRUCEL) oral powder Take 1 packet by mouth daily.     ondansetron  (ZOFRAN ) 4 MG tablet Take 1 tablet (4 mg  total) by mouth every 8 (eight) hours as needed for nausea or vomiting. 20 tablet 0   pravastatin  (PRAVACHOL ) 40 MG tablet TAKE ONE TABLET BY MOUTH EVERY EVENING 90 tablet 3   SYNTHROID  100 MCG tablet Take 1 tablet (100 mcg total) by mouth daily before breakfast. TAKE ONE TABLET BY MOUTH DAILY BEFORE A MEAL 90 tablet 1   ZEPBOUND  5 MG/0.5ML Pen      No current facility-administered medications for this visit.    ALLERGIES: Cat dander  Family History  Problem Relation Age of Onset   Hypertension Mother    High Cholesterol Mother    Sleep apnea Mother    Heart disease Father    Alcohol abuse Father    Anxiety disorder Father    Stomach cancer Other    Colon cancer Neg Hx    Esophageal cancer Neg Hx    Pancreatic cancer Neg Hx    Prostate cancer Neg Hx    Rectal cancer Neg Hx     Review of Systems  All other systems reviewed and are negative.   PHYSICAL EXAM:  BP 118/78 (BP Location: Left Arm, Patient Position: Sitting)   Pulse 66   Ht 5' 6.75 (1.695 m)   Wt 205 lb (93 kg)   LMP 11/15/2018   SpO2 99%   BMI 32.35 kg/m     General appearance: alert, cooperative and appears stated age Head: normocephalic, without obvious abnormality, atraumatic Neck: no adenopathy, supple, symmetrical, trachea midline and thyroid normal to inspection and palpation Lungs: clear to auscultation bilaterally Breasts: normal appearance, no masses or tenderness, No nipple retraction or dimpling, No nipple discharge or bleeding, No axillary adenopathy Heart: regular rate and rhythm Abdomen: soft, non-tender; no masses, no organomegaly Extremities: extremities normal, atraumatic, no cyanosis or edema Skin: skin color, texture, turgor normal. No rashes or lesions Lymph nodes: cervical, supraclavicular, and axillary nodes normal. Neurologic: grossly normal  Pelvic: External genitalia:  no lesions              No abnormal inguinal nodes palpated.              Urethra:  normal appearing urethra  with no masses, tenderness or lesions              Bartholins and Skenes: normal                 Vagina: normal appearing vagina with normal color and discharge, no lesions              Cervix: no lesions              Pap taken: {yes no:314532} Bimanual  Exam:  Uterus:  normal size, contour, position, consistency, mobility, non-tender              Adnexa: no mass, fullness, tenderness              Rectal exam: {yes no:314532}.  Confirms.              Anus:  normal sphincter tone, no lesions  Chaperone was present for exam:  {BSCHAPERONE:31226::Emily F, CMA}  ASSESSMENT: Well woman visit with gynecologic exam. Status post TAH/bilateral salpingectomy 11/15/2018.  Fibroids.  ERT. Mixed incontinence.  PHQ-2-9: 0  ***  PLAN: Mammogram screening discussed. Self breast awareness reviewed. Pap and HRV collected:  {yes no:314532} Guidelines for Calcium , Vitamin D , regular exercise program including cardiovascular and weight bearing exercise. Medication refills:  *** {LABS (Optional):23779} *** Dexa at [facility].   Follow up:  ***    Additional counseling given.  {yes [id number]. ***  total time was spent for this patient encounter, including preparation, face-to-face counseling with the patient, coordination of care, and documentation of the encounter in addition to doing the well woman visit with gynecologic exam.

## 2024-01-24 NOTE — Patient Instructions (Addendum)
 Phone number for scheduling bone density at [facility], [phone].    EXERCISE AND DIET:  We recommended that you start or continue a regular exercise program for good health. Regular exercise means any activity that makes your heart beat faster and makes you sweat.  We recommend exercising at least 30 minutes per day at least 3 days a week, preferably 4 or 5.  We also recommend a diet low in fat and sugar.  Inactivity, poor dietary choices and obesity can cause diabetes, heart attack, stroke, and kidney damage, among others.    ALCOHOL AND SMOKING:  Women should limit their alcohol intake to no more than 7 drinks/beers/glasses of wine (combined, not each!) per week. Moderation of alcohol intake to this level decreases your risk of breast cancer and liver damage. And of course, no recreational drugs are part of a healthy lifestyle.  And absolutely no smoking or even second hand smoke. Most people know smoking can cause heart and lung diseases, but did you know it also contributes to weakening of your bones? Aging of your skin?  Yellowing of your teeth and nails?  CALCIUM AND VITAMIN D:  Adequate intake of calcium and Vitamin D are recommended.  The recommendations for exact amounts of these supplements seem to change often, but generally speaking 600 mg of calcium (either carbonate or citrate) and 800 units of Vitamin D per day seems prudent. Certain women may benefit from higher intake of Vitamin D.  If you are among these women, your doctor will have told you during your visit.    PAP SMEARS:  Pap smears, to check for cervical cancer or precancers,  have traditionally been done yearly, although recent scientific advances have shown that most women can have pap smears less often.  However, every woman still should have a physical exam from her gynecologist every year. It will include a breast check, inspection of the vulva and vagina to check for abnormal growths or skin changes, a visual exam of the  cervix, and then an exam to evaluate the size and shape of the uterus and ovaries.  And after 58 years of age, a rectal exam is indicated to check for rectal cancers. We will also provide age appropriate advice regarding health maintenance, like when you should have certain vaccines, screening for sexually transmitted diseases, bone density testing, colonoscopy, mammograms, etc.   MAMMOGRAMS:  All women over 6 years old should have a yearly mammogram. Many facilities now offer a "3D" mammogram, which may cost around $50 extra out of pocket. If possible,  we recommend you accept the option to have the 3D mammogram performed.  It both reduces the number of women who will be called back for extra views which then turn out to be normal, and it is better than the routine mammogram at detecting truly abnormal areas.    COLONOSCOPY:  Colonoscopy to screen for colon cancer is recommended for all women at age 70.  We know, you hate the idea of the prep.  We agree, BUT, having colon cancer and not knowing it is worse!!  Colon cancer so often starts as a polyp that can be seen and removed at colonscopy, which can quite literally save your life!  And if your first colonoscopy is normal and you have no family history of colon cancer, most women don't have to have it again for 10 years.  Once every ten years, you can do something that may end up saving your life, right?  We will  be happy to help you get it scheduled when you are ready.  Be sure to check your insurance coverage so you understand how much it will cost.  It may be covered as a preventative service at no cost, but you should check your particular policy.

## 2024-01-25 ENCOUNTER — Encounter (INDEPENDENT_AMBULATORY_CARE_PROVIDER_SITE_OTHER): Payer: Self-pay | Admitting: Adult Health

## 2024-01-25 ENCOUNTER — Ambulatory Visit (INDEPENDENT_AMBULATORY_CARE_PROVIDER_SITE_OTHER): Admitting: Adult Health

## 2024-01-25 ENCOUNTER — Other Ambulatory Visit: Payer: Self-pay | Admitting: Obstetrics and Gynecology

## 2024-01-25 VITALS — BP 125/87 | HR 66 | Temp 98.5°F | Ht 67.0 in | Wt 201.0 lb

## 2024-01-25 DIAGNOSIS — E559 Vitamin D deficiency, unspecified: Secondary | ICD-10-CM

## 2024-01-25 DIAGNOSIS — E782 Mixed hyperlipidemia: Secondary | ICD-10-CM | POA: Diagnosis not present

## 2024-01-25 DIAGNOSIS — E88819 Insulin resistance, unspecified: Secondary | ICD-10-CM | POA: Diagnosis not present

## 2024-01-25 DIAGNOSIS — R632 Polyphagia: Secondary | ICD-10-CM | POA: Diagnosis not present

## 2024-01-25 DIAGNOSIS — Z6832 Body mass index (BMI) 32.0-32.9, adult: Secondary | ICD-10-CM

## 2024-01-25 DIAGNOSIS — Z1231 Encounter for screening mammogram for malignant neoplasm of breast: Secondary | ICD-10-CM

## 2024-01-25 DIAGNOSIS — Z6833 Body mass index (BMI) 33.0-33.9, adult: Secondary | ICD-10-CM

## 2024-01-25 MED ORDER — ZEPBOUND 5 MG/0.5ML ~~LOC~~ SOAJ: 5.0000 mg | SUBCUTANEOUS | 0 refills | Status: DC

## 2024-01-25 MED ORDER — ERGOCALCIFEROL 1.25 MG (50000 UT) PO CAPS: 50000.0000 [IU] | ORAL_CAPSULE | ORAL | 0 refills | Status: DC

## 2024-01-25 NOTE — Progress Notes (Signed)
 WEIGHT SUMMARY AND BIOMETRICS  Vitals Temp: 98.5 F (36.9 C) BP: 125/87 Pulse Rate: 66 SpO2: 98 %   Anthropometric Measurements Height: 5' 7 (1.702 m) Weight: 201 lb (91.2 kg) BMI (Calculated): 31.47 Weight at Last Visit: 205lb Weight Lost Since Last Visit: 4lb Weight Gained Since Last Visit: 0lb Starting Weight: 224lb Total Weight Loss (lbs): 23 lb (10.4 kg) Peak Weight: 236lb   Body Composition  Body Fat %: 39.4 % Fat Mass (lbs): 79.4 lbs Muscle Mass (lbs): 115.8 lbs Total Body Water (lbs): 75.8 lbs Visceral Fat Rating : 10   Other Clinical Data Fasting: Yes Labs: No Today's Visit #: 20 Starting Date: 06/18/20    Chief Complaint:   OBESITY Tricia Miranda is here to discuss her progress with her obesity treatment plan.  She is on the the Category 2 Plan and states she is following her eating plan approximately 90 % of the time.  She states she is exercising Rowing/Swimming/Cardio 60 minutes 3 times per week.  Interim History:  She started on weekly loading dose of Zepbound  2.5mg   on/about 05/28/2023  12/02/2023 Zepbound  2.5mg  increased 5mg    Denies mass in neck, dysphagia, dyspepsia, persistent hoarseness, abdominal pain, or N/V/C   She feels that her appetite is stable  Since has last OV with [facility]- She has been seen by Dermatology, Orthopedics, and GYN  She requests adding on Cards Labs that Dr. Mona as ordered as future (Lipid and LPA)  Reviewed Bioimpedance Results with pt: Muscle Mass: -0.4 lb Adipose Mass: -3.6 lbs  Subjective:   1. Mixed hyperlipidemia Dr. Mona manages daily Zetia  10mg  and Pravachol  40mg  She requests adding on Cards Labs that Dr. Mona as ordered as future (Lipid and LPA)  2. Insulin  resistance She started on weekly loading dose of Zepbound  2.5mg   on/about 05/28/2023  12/02/2023 Zepbound  2.5mg  increased to 5mg    Denies mass in neck, dysphagia, dyspepsia, persistent hoarseness, abdominal pain, or N/V/C   Of Note- Status  post hysterectomy.     3. Vitamin D  deficiency  Latest Reference Range & Units 11/16/22 11:48 03/04/23 13:05 06/07/23 10:35  Vitamin D , 25-Hydroxy 30.0 - 100.0 ng/mL 43.9 40.1 46.5   Vit  D level just below goal of 50 She is on weekly Ergocalciferol - denies N/V/Muscle Weaknes  4. Polyphagia She started on weekly loading dose of Zepbound  2.5mg   on/about 05/28/2023 Denies mass in neck, dysphagia, dyspepsia, persistent hoarseness, abdominal pain, or N/V/C  12/02/2023 Zepbound  2.5mg  increased to 5mg    She reports stable appetite  Of Note- Status post hysterectomy.     Assessment/Plan:   1. Mixed hyperlipidemia (Primary) Check Labs - Comprehensive metabolic panel with GFR -Lipid- FORWARD TO DR. HILTY -LPA- FORWARD TO DR. HILTY  2. Insulin  resistance Check Labs - Hemoglobin A1c - Insulin , random  3. Vitamin D  deficiency Check Labs - VITAMIN D  25 Hydroxy (Vit-D Deficiency, Fractures) Refill ergocalciferol  (DRISDOL ) 1.25 MG (50000 UT) capsule Take 1 capsule (50,000 Units total) by mouth once a week. Dispense: 4 capsule, Refills: 0 ordered   4. Polyphagia Increase lean protein Remain active daily  5. BMI 33.0-33.9,adult, CURRENT BMI 32.2 Refill  ZEPBOUND  5 MG/0.5ML Pen Inject 5 mg into the skin once a week. Dispense: 2 mL, Refills: 0 ordered   Tricia Miranda is currently in the action stage of change. As such, her goal is to continue with weight loss efforts. She has agreed to the Category 2 Plan.   Exercise goals: For substantial health benefits, adults should do at least  150 minutes (2 hours and 30 minutes) a week of moderate-intensity, or 75 minutes (1 hour and 15 minutes) a week of vigorous-intensity aerobic physical activity, or an equivalent combination of moderate- and vigorous-intensity aerobic activity. Aerobic activity should be performed in episodes of at least 10 minutes, and preferably, it should be spread throughout the week.  Behavioral modification strategies: increasing  lean protein intake, decreasing simple carbohydrates, increasing vegetables, increasing water intake, no skipping meals, meal planning and cooking strategies, keeping healthy foods in the home, ways to avoid boredom eating, and planning for success.  Tricia Miranda has agreed to follow-up with our clinic in 4 weeks. She was informed of the importance of frequent follow-up visits to maximize her success with intensive lifestyle modifications for her multiple health conditions.   Tricia Miranda was informed we would discuss her lab results at her next visit unless there is a critical issue that needs to be addressed sooner. Tricia Miranda agreed to keep her next visit at the agreed upon time to discuss these results.  Objective:   Blood pressure 125/87, pulse 66, temperature 98.5 F (36.9 C), height 5' 7 (1.702 m), weight 201 lb (91.2 kg), last menstrual period 11/15/2018, SpO2 98%. Body mass index is 31.48 kg/m.  General: Cooperative, alert, well developed, in no acute distress. HEENT: Conjunctivae and lids unremarkable. Cardiovascular: Regular rhythm.  Lungs: Normal work of breathing. Neurologic: No focal deficits.   Lab Results  Component Value Date   CREATININE 0.56 08/17/2023   BUN 13 08/17/2023   NA 140 08/17/2023   K 4.4 08/17/2023   CL 106 08/17/2023   CO2 28 08/17/2023   Lab Results  Component Value Date   ALT 13 08/17/2023   AST 12 08/17/2023   ALKPHOS 93 06/07/2023   BILITOT 0.5 08/17/2023   Lab Results  Component Value Date   HGBA1C 5.2 06/07/2023   HGBA1C 5.2 02/22/2023   HGBA1C 5.2 11/16/2022   HGBA1C 5.3 05/07/2022   HGBA1C 5.1 05/20/2021   Lab Results  Component Value Date   INSULIN  8.3 06/07/2023   INSULIN  16.4 11/16/2022   INSULIN  13.1 05/07/2022   INSULIN  9.2 05/20/2021   INSULIN  6.9 06/18/2020   Lab Results  Component Value Date   TSH 1.39 08/17/2023   Lab Results  Component Value Date   CHOL 161 08/17/2023   HDL 59 08/17/2023   LDLCALC 82 08/17/2023   TRIG 103  08/17/2023   CHOLHDL 2.7 08/17/2023   Lab Results  Component Value Date   VD25OH 46.5 06/07/2023   VD25OH 40.1 03/04/2023   VD25OH 43.9 11/16/2022   Lab Results  Component Value Date   WBC 5.8 08/17/2023   HGB 13.2 08/17/2023   HCT 41.0 08/17/2023   MCV 88.0 08/17/2023   PLT 243 08/17/2023   Lab Results  Component Value Date   IRON 47 01/15/2017   TIBC 448 01/15/2017   FERRITIN 122 06/18/2020   Attestation Statements:   Reviewed by clinician on day of visit: allergies, medications, problem list, medical history, surgical history, family history, social history, and previous encounter notes.  I have reviewed the above documentation for accuracy and completeness, and I agree with the above. -  Noell Shular d. Vannah Nadal, NP-C

## 2024-01-26 ENCOUNTER — Other Ambulatory Visit: Payer: Self-pay | Admitting: *Deleted

## 2024-01-26 DIAGNOSIS — E785 Hyperlipidemia, unspecified: Secondary | ICD-10-CM

## 2024-01-26 LAB — LIPID PANEL
Chol/HDL Ratio: 2.3 ratio (ref 0.0–4.4)
Cholesterol, Total: 144 mg/dL (ref 100–199)
HDL: 62 mg/dL (ref >39)
LDL Chol Calc (NIH): 67 mg/dL (ref 0–99)
Triglycerides: 75 mg/dL (ref 0–149)
VLDL Cholesterol Cal: 15 mg/dL (ref 5–40)

## 2024-01-26 LAB — INSULIN, RANDOM: INSULIN: 7.5 u[IU]/mL (ref 2.6–24.9)

## 2024-01-26 LAB — COMPREHENSIVE METABOLIC PANEL WITH GFR
ALT: 19 IU/L (ref 0–32)
AST: 15 IU/L (ref 0–40)
Albumin: 4.3 g/dL (ref 3.8–4.9)
Alkaline Phosphatase: 91 IU/L (ref 49–135)
BUN/Creatinine Ratio: 22 (ref 9–23)
BUN: 14 mg/dL (ref 6–24)
Bilirubin Total: 0.5 mg/dL (ref 0.0–1.2)
CO2: 22 mmol/L (ref 20–29)
Calcium: 9.3 mg/dL (ref 8.7–10.2)
Chloride: 101 mmol/L (ref 96–106)
Creatinine, Ser: 0.64 mg/dL (ref 0.57–1.00)
Globulin, Total: 2.3 g/dL (ref 1.5–4.5)
Glucose: 80 mg/dL (ref 70–99)
Potassium: 4.3 mmol/L (ref 3.5–5.2)
Sodium: 139 mmol/L (ref 134–144)
Total Protein: 6.6 g/dL (ref 6.0–8.5)
eGFR: 102 mL/min/1.73 (ref >59)

## 2024-01-26 LAB — HEMOGLOBIN A1C
Est. average glucose Bld gHb Est-mCnc: 97 mg/dL
Hgb A1c MFr Bld: 5 % (ref 4.8–5.6)

## 2024-01-26 LAB — VITAMIN D 25 HYDROXY (VIT D DEFICIENCY, FRACTURES): Vit D, 25-Hydroxy: 59.3 ng/mL (ref 30.0–100.0)

## 2024-01-26 LAB — LIPOPROTEIN A (LPA): Lipoprotein (a): 61.4 nmol/L (ref <75.0)

## 2024-01-27 ENCOUNTER — Ambulatory Visit: Payer: Self-pay | Admitting: Internal Medicine

## 2024-01-30 ENCOUNTER — Other Ambulatory Visit (INDEPENDENT_AMBULATORY_CARE_PROVIDER_SITE_OTHER): Payer: Self-pay | Admitting: Adult Health

## 2024-01-30 DIAGNOSIS — E559 Vitamin D deficiency, unspecified: Secondary | ICD-10-CM

## 2024-02-17 ENCOUNTER — Other Ambulatory Visit (INDEPENDENT_AMBULATORY_CARE_PROVIDER_SITE_OTHER): Payer: Self-pay | Admitting: Adult Health

## 2024-02-17 DIAGNOSIS — E559 Vitamin D deficiency, unspecified: Secondary | ICD-10-CM

## 2024-02-19 ENCOUNTER — Other Ambulatory Visit (INDEPENDENT_AMBULATORY_CARE_PROVIDER_SITE_OTHER): Payer: Self-pay | Admitting: Adult Health

## 2024-02-19 DIAGNOSIS — E559 Vitamin D deficiency, unspecified: Secondary | ICD-10-CM

## 2024-02-21 ENCOUNTER — Other Ambulatory Visit

## 2024-02-21 ENCOUNTER — Encounter: Payer: Self-pay | Admitting: Radiology

## 2024-02-21 DIAGNOSIS — E782 Mixed hyperlipidemia: Secondary | ICD-10-CM

## 2024-02-21 DIAGNOSIS — E88819 Insulin resistance, unspecified: Secondary | ICD-10-CM

## 2024-02-21 DIAGNOSIS — E039 Hypothyroidism, unspecified: Secondary | ICD-10-CM

## 2024-02-22 LAB — LIPID PANEL
Cholesterol: 158 mg/dL (ref <200)
HDL: 69 mg/dL (ref >=50)
LDL Cholesterol (Calc): 73 mg/dL
Non-HDL Cholesterol (Calc): 89 mg/dL (ref <130)
Total CHOL/HDL Ratio: 2.3 (calc) (ref <5.0)
Triglycerides: 82 mg/dL (ref <150)

## 2024-02-22 LAB — TSH: TSH: 3.06 m[IU]/L (ref 0.40–4.50)

## 2024-02-23 ENCOUNTER — Ambulatory Visit
Admission: RE | Admit: 2024-02-23 | Discharge: 2024-02-23 | Disposition: A | Source: Ambulatory Visit | Attending: Obstetrics and Gynecology | Admitting: Obstetrics and Gynecology

## 2024-02-23 ENCOUNTER — Other Ambulatory Visit: Payer: Self-pay

## 2024-02-23 DIAGNOSIS — E038 Other specified hypothyroidism: Secondary | ICD-10-CM

## 2024-02-23 DIAGNOSIS — Z1231 Encounter for screening mammogram for malignant neoplasm of breast: Secondary | ICD-10-CM

## 2024-02-23 LAB — HM MAMMOGRAPHY

## 2024-02-23 MED ORDER — SYNTHROID 100 MCG PO TABS: 100.0000 ug | ORAL_TABLET | Freq: Every day | ORAL | 1 refills | Status: DC

## 2024-02-25 ENCOUNTER — Encounter: Payer: Self-pay | Admitting: Internal Medicine

## 2024-02-25 ENCOUNTER — Ambulatory Visit: Payer: Self-pay | Admitting: Internal Medicine

## 2024-02-25 VITALS — BP 100/78 | HR 66 | Ht 67.0 in | Wt 205.0 lb

## 2024-02-25 DIAGNOSIS — E785 Hyperlipidemia, unspecified: Secondary | ICD-10-CM

## 2024-02-25 DIAGNOSIS — E559 Vitamin D deficiency, unspecified: Secondary | ICD-10-CM

## 2024-02-25 DIAGNOSIS — F418 Other specified anxiety disorders: Secondary | ICD-10-CM

## 2024-02-25 DIAGNOSIS — F32A Depression, unspecified: Secondary | ICD-10-CM

## 2024-02-25 DIAGNOSIS — E669 Obesity, unspecified: Secondary | ICD-10-CM

## 2024-02-25 DIAGNOSIS — E039 Hypothyroidism, unspecified: Secondary | ICD-10-CM | POA: Diagnosis not present

## 2024-02-25 DIAGNOSIS — E782 Mixed hyperlipidemia: Secondary | ICD-10-CM

## 2024-02-25 DIAGNOSIS — M170 Bilateral primary osteoarthritis of knee: Secondary | ICD-10-CM

## 2024-02-25 DIAGNOSIS — Z6832 Body mass index (BMI) 32.0-32.9, adult: Secondary | ICD-10-CM

## 2024-02-25 MED ORDER — LEVOTHYROXINE SODIUM 112 MCG PO TABS: 112.0000 ug | ORAL_TABLET | Freq: Every day | ORAL | 0 refills | Status: AC

## 2024-02-25 NOTE — Progress Notes (Signed)
 Patient Care Team: Perri Ronal PARAS, MD as PCP - General (Internal Medicine)  Visit Date: 02/25/24  Subjective:    Patient ID: Tricia Miranda , Female   DOB: 06/01/1965, 58 y.o.    MRN: [id number]   58 y.o. Female presents today for 6 month follow up for Hyperlipidemia, Hypothyroidism. Patient has a past medical history of Anxiety/depression, Vitamin-D deficiency, Osteoarthritis, Obesity.  History of Hyperlipidemia treated with Pravastatin  40 mg daily and Zetia  10 mg daily. 02/21/2024  Recent Lipid panel normal with HDL 69, Total cholesterol 841, triglycerides 82, LDL 73.  History of Hypothyroidism treated with Synthroid  100 mcg daily. 02/21/2024 TSH 3.06. She said she has noticed that  her hair has been thinning and her skin has been dry.  History of Anxiety/ Depression treated with Xanax  0.25 mg twice daily as needed and Prozac  20 mg daily.    History of Vitamin-D Deficiency treated with Vitamin-D 50,000 units weekly.    History of Osteoarthritis, Bilateral Knees treated with Meloxicam  12 mg as needed and join injections occasionally.    History of Obesity; Current weight is 205 lb BMI 32.11. She has lost 11 pounds since her last visit on 08/20/2023. On Zepbound  5 mg injected weekly for weight loss. Followed by [facility].    Vaccine counseling: Pneumonia vaccine due.    Past Medical History:  Diagnosis Date   Anxiety    Anxiety    Bronchitis    Cancer (HCC)    basal cell- tiny   Cancer (HCC)    Fibroids    uterine    High cholesterol    Hypothyroidism    Joint pain    Low iron    Low vitamin D  level    Osteoarthritis    PONV (postoperative nausea and vomiting)    Sleep apnea    uses oral device for mild sleep apnea    Thyroid disease    hypothyroidism   Vitamin B deficiency      Family History  Problem Relation Age of Onset   Hypertension Mother    High Cholesterol Mother    Sleep apnea Mother    Heart disease Father    Alcohol abuse  Father    Anxiety disorder Father    Stomach cancer Other    Colon cancer Neg Hx    Esophageal cancer Neg Hx    Pancreatic cancer Neg Hx    Prostate cancer Neg Hx    Rectal cancer Neg Hx     Social History   Social History Narrative   Works as an pensions consultant for the [employer] Married to an pensions consultant who teaches at [employer] of [address].  1 son.  Non-smoker.  Does not get a lot of exercise.    Separated.      Son in an apartment in [place], going to school for archeology.    Fhx:   No Family History of Esophageal, Colon, Rectal, Pancreatic, or Prostate Cancer   Father w/ hx of Alcoholism, Anxiety Disorder, Heart Disease, 2-Vessel Bypass (was a smoker), and MI   Maternal Grandmother w/ hx of Adult Onset Diabetes   Mother w/ hx of Elevated Cholesterol (reportedly 400s), Non-significant Cardiac Catheterization Hypertension, and Sleep Apnea   1 Sister w/ hx of Hip Dysplasia   Unknown Family w/ hx of Stomach Cancer    Hx of coronary calcium  score of 0.  Review of Systems  All other systems reviewed and are negative.  Social Hx:  she and husband live apart. He is a sports administrator and she works as an pensions consultant for [employer]. One son attending [employer].Nonsmoker.     Objective:   Vitals: BP 100/78   Pulse 66   Ht 5' 7 (1.702 m)   Wt 205 lb (93 kg)   LMP 11/15/2018   SpO2 96%   BMI 32.11 kg/m    Physical Exam Vitals and nursing note reviewed.  Constitutional:      General: She is not in acute distress.    Appearance: Normal appearance. She is not toxic-appearing.  HENT:     Head: Normocephalic and atraumatic.  Cardiovascular:     Rate and Rhythm: Normal rate and regular rhythm. No extrasystoles are present.    Pulses: Normal pulses.     Heart sounds: Normal heart sounds. No murmur heard.    No friction rub. No gallop.  Pulmonary:     Effort: Pulmonary effort is normal. No respiratory distress.     Breath sounds: Normal  breath sounds. No wheezing or rales.  Musculoskeletal:     Right lower leg: No edema.     Left lower leg: No edema.  Skin:    General: Skin is warm and dry.  Neurological:     Mental Status: She is alert and oriented to person, place, and time. Mental status is at baseline.  Psychiatric:        Mood and Affect: Mood normal.        Behavior: Behavior normal.        Thought Content: Thought content normal.        Judgment: Judgment normal.       Results:    Labs:       Component Value Date/Time   NA 139 01/25/2024 1037   K 4.3 01/25/2024 1037   CL 101 01/25/2024 1037   CO2 22 01/25/2024 1037   GLUCOSE 80 01/25/2024 1037   GLUCOSE 79 08/17/2023 1011   BUN 14 01/25/2024 1037   CREATININE 0.64 01/25/2024 1037   CREATININE 0.56 08/17/2023 1011   CALCIUM  9.3 01/25/2024 1037   PROT 6.6 01/25/2024 1037   ALBUMIN 4.3 01/25/2024 1037   AST 15 01/25/2024 1037   ALT 19 01/25/2024 1037   ALKPHOS 91 01/25/2024 1037   BILITOT 0.5 01/25/2024 1037   GFRNONAA >60 07/08/2022 1009   GFRNONAA 108 05/28/2020 1145   GFRAA 126 05/28/2020 1145     Lab Results  Component Value Date   WBC 5.8 08/17/2023   HGB 13.2 08/17/2023   HCT 41.0 08/17/2023   MCV 88.0 08/17/2023   PLT 243 08/17/2023    Lab Results  Component Value Date   CHOL 158 02/21/2024   HDL 69 02/21/2024   LDLCALC 73 02/21/2024   TRIG 82 02/21/2024   CHOLHDL 2.3 02/21/2024    Lab Results  Component Value Date   HGBA1C 5.0 01/25/2024     Lab Results  Component Value Date   TSH 3.06 02/21/2024        Assessment & Plan:   Meds ordered this encounter  Medications   levothyroxine  (SYNTHROID ) 112 MCG tablet    Sig: Take 1 tablet (112 mcg total) by mouth daily before breakfast.    Dispense:  90 tablet    Refill:  0     Hyperlipidemia: treated with Pravastatin  40 mg daily and Zetia  10 mg daily. 02/21/2024 Lipid panel normal.  Hypothyroidism treated with Synthroid  100 mcg daily. 02/21/2024 TSH 3.06. She  said she has noticed that her hair has been thinning and her skin has been drier.   Synthroid  increased from 100 mcg from 112 mcg. Recheck TSH January 5th for lab only.  Anxiety/Depression treated with Xanax  0.25 mg twice daily as needed and Prozac  20 mg daily.    Vitamin-D Deficiency treated with Vitamin-D 50,000 units weekly.    Osteoarthritis: Bilateral Knees treated with Meloxicam  12 mg as needed and join injections occasionally.    Obesity: Current weight is 205 lb BMI 32.11. She has lost 11 pounds since her last visit on 08/20/2023. On Zepbound  5 mg injected weekly for weight loss. Followed by [facility].    Vaccine counseling: Pneumonia vaccine due.     I,Makayla C Reid,acting as a scribe for Ronal JINNY Hailstone, MD.,have documented all relevant documentation on the behalf of Ronal JINNY Hailstone, MD,as directed by  Ronal JINNY Hailstone, MD while in the presence of Ronal JINNY Hailstone, MD.

## 2024-02-28 ENCOUNTER — Ambulatory Visit: Payer: Self-pay | Admitting: Obstetrics and Gynecology

## 2024-03-02 ENCOUNTER — Encounter (INDEPENDENT_AMBULATORY_CARE_PROVIDER_SITE_OTHER): Payer: Self-pay | Admitting: Adult Health

## 2024-03-02 ENCOUNTER — Ambulatory Visit (INDEPENDENT_AMBULATORY_CARE_PROVIDER_SITE_OTHER): Payer: Self-pay | Admitting: Adult Health

## 2024-03-02 VITALS — BP 109/72 | HR 66 | Temp 98.7°F | Ht 67.0 in | Wt 201.0 lb

## 2024-03-02 DIAGNOSIS — E782 Mixed hyperlipidemia: Secondary | ICD-10-CM

## 2024-03-02 DIAGNOSIS — E88819 Insulin resistance, unspecified: Secondary | ICD-10-CM

## 2024-03-02 DIAGNOSIS — R632 Polyphagia: Secondary | ICD-10-CM

## 2024-03-02 DIAGNOSIS — Z6831 Body mass index (BMI) 31.0-31.9, adult: Secondary | ICD-10-CM

## 2024-03-02 DIAGNOSIS — E669 Obesity, unspecified: Secondary | ICD-10-CM

## 2024-03-02 DIAGNOSIS — E559 Vitamin D deficiency, unspecified: Secondary | ICD-10-CM

## 2024-03-02 DIAGNOSIS — F419 Anxiety disorder, unspecified: Secondary | ICD-10-CM

## 2024-03-02 DIAGNOSIS — F32A Depression, unspecified: Secondary | ICD-10-CM

## 2024-03-02 DIAGNOSIS — Z6833 Body mass index (BMI) 33.0-33.9, adult: Secondary | ICD-10-CM

## 2024-03-02 MED ORDER — ERGOCALCIFEROL 1.25 MG (50000 UT) PO CAPS: 50000.0000 [IU] | ORAL_CAPSULE | ORAL | 0 refills | Status: DC

## 2024-03-02 MED ORDER — ZEPBOUND 5 MG/0.5ML ~~LOC~~ SOAJ: 5.0000 mg | SUBCUTANEOUS | 0 refills | Status: DC

## 2024-03-02 NOTE — Progress Notes (Signed)
 WEIGHT SUMMARY AND BIOMETRICS  Vitals Temp: 98.7 F (37.1 C) BP: 109/72 Pulse Rate: 66 SpO2: 98 %   Anthropometric Measurements Height: 5' 7 (1.702 m) Weight: 201 lb (91.2 kg) BMI (Calculated): 31.47 Weight at Last Visit: 201 lb Weight Lost Since Last Visit: 0 Weight Gained Since Last Visit: 0 Starting Weight: 224 lb Total Weight Loss (lbs): 23 lb (10.4 kg) Peak Weight: 236 lb   Body Composition  Body Fat %: 40.6 % Fat Mass (lbs): 82 lbs Muscle Mass (lbs): 113.8 lbs Total Body Water (lbs): 77 lbs Visceral Fat Rating : 10   Other Clinical Data Fasting: yes Labs: no Today's Visit #: 48 Starting Date: 06/18/20    Chief Complaint:   OBESITY Tricia Miranda is here to discuss her progress with her obesity treatment plan. She is on the the Category 2 Plan and states she is following her eating plan approximately 80 % of the time.  She states she is exercising Swimming/Walking 60 minutes 5 times per week.  Interim History:   She started on weekly loading dose of Zepbound  2.5mg   on/about 05/28/2023 12/02/2023 Zepbound  2.5mg  increased 5mg   She has had two months on the 5mg  strength Denies mass in neck, dysphagia, dyspepsia, persistent hoarseness, abdominal pain, or N/V/C   02/25/2024 OV with established PCP/Dr. Perri PCP check labs and increased Levothyroxine  from 100mcg to 112 mcg  She has been travelling extensively over the last 4 weeks: [place] and [address] When in [place], she walked > 12K steps/day  Subjective:   1. Insulin  resistance Discussed Labs  Latest Reference Range & Units 01/25/24 10:37  Glucose 70 - 99 mg/dL 80  Hemoglobin J8R 4.8 - 5.6 % 5.0  Est. average glucose Bld gHb Est-mCnc mg/dL 97  INSULIN  2.6 - 24.9 uIU/mL 7.5   CBG, A1c at goal Insulin  level improved, however still above goal of 5 She is on weekly Zepbound  5mg   2. Vitamin D  deficiency Discussed Labs  Latest Reference Range & Units 01/25/24 10:37  Vitamin D , 25-Hydroxy 30.0  - 100.0 ng/mL 59.3   Vit D Level is stable and at goal She is on weekly Ergocalciferol - denies N/V/Muscle Weakness  3. Polyphagia She started on weekly loading dose of Zepbound  2.5mg   on/about 05/28/2023 12/02/2023 Zepbound  2.5mg  increased 5mg   She has had two months on the 5mg  strength Denies mass in neck, dysphagia, dyspepsia, persistent hoarseness, abdominal pain, or N/V/C   4. Mixed hyperlipidemia Discussed Labs Lipid Panel     Component Value Date/Time   CHOL 158 02/21/2024 1004   CHOL 144 01/25/2024 1037   TRIG 82 02/21/2024 1004   HDL 69 02/21/2024 1004   HDL 62 01/25/2024 1037   CHOLHDL 2.3 02/21/2024 1004   VLDL 26 11/08/2015 1026   LDLCALC 73 02/21/2024 1004   LABVLDL 15 01/25/2024 1037   The 10-year ASCVD risk score (Arnett DK, et al., 2019) is: 1.3%   Values used to calculate the score:     Age: 32 years     Clincally relevant sex: Female     Is Non-Hispanic African American: No     Diabetic: No     Tobacco smoker: No     Systolic Blood Pressure: 109 mmHg     Is BP treated: No     HDL Cholesterol: 69 mg/dL     Total Cholesterol: 158 mg/dL   HWW checked lipid panel 01/25/2024- results forwarded to her established Cardiologist/Dr. Mona PCP ran another lipid panel 02/21/24 ??? Lipid panel  stable and ASCVD risk stratification is at goal  5. Anxiety and depression Discussed Labs 02/25/2024 OV with established PCP/Dr. Perri PCP check labs and increased Levothyroxine  from 100mcg to 112 mcg   Latest Reference Range & Units 02/21/24 10:04  TSH 0.40 - 4.50 mIU/L 3.06   PDMP Reviewed- low dose Alprazolam  0.25mg  is being filed every 4-6 weeks  Assessment/Plan:   1. Insulin  resistance (Primary) Refill  ZEPBOUND  5 MG/0.5ML Pen Inject 5 mg into the skin once a week. Dispense: 2 mL, Refills: 0 ordered   2. Vitamin D  deficiency Refill ergocalciferol  (DRISDOL ) 1.25 MG (50000 UT) capsule Take 1 capsule (50,000 Units total) by mouth once a week. Dispense: 4 capsule,  Refills: 0 ordered   3. Polyphagia Increase lean protein and regular cardiovascular exercise  4. Mixed hyperlipidemia Limit sat fat and increase regular cardiovascular exercise  45 Anxiety and depression 02/25/2024 OV with established PCP/Dr. Perri PCP check labs and increased Levothyroxine  from 100mcg to 112 mcg F/u with PCP as directed  6. BMI 33.0-33.9,adult, CURRENT BMI 31.6 Refill  ZEPBOUND  5 MG/0.5ML Pen Inject 5 mg into the skin once a week. Dispense: 2 mL, Refills: 0 ordered   [name] is currently in the action stage of change. As such, her goal is to continue with weight loss efforts. She has agreed to the Category 2 Plan.   Exercise goals: For substantial health benefits, adults should do at least 150 minutes (2 hours and 30 minutes) a week of moderate-intensity, or 75 minutes (1 hour and 15 minutes) a week of vigorous-intensity aerobic physical activity, or an equivalent combination of moderate- and vigorous-intensity aerobic activity. Aerobic activity should be performed in episodes of at least 10 minutes, and preferably, it should be spread throughout the week.  Behavioral modification strategies: increasing lean protein intake, decreasing simple carbohydrates, increasing vegetables, increasing water intake, no skipping meals, meal planning and cooking strategies, keeping healthy foods in the home, ways to avoid boredom eating, and planning for success.  [name] has agreed to follow-up with our clinic in 4 weeks. She was informed of the importance of frequent follow-up visits to maximize her success with intensive lifestyle modifications for her multiple health conditions.   Utilize [employer] to increase regular strength training.  Objective:   Blood pressure 109/72, pulse 66, temperature 98.7 F (37.1 C), height 5' 7 (1.702 m), weight 201 lb (91.2 kg), last menstrual period 11/15/2018, SpO2 98%. Body mass index is 31.48 kg/m.  General: Cooperative, alert, well  developed, in no acute distress. HEENT: Conjunctivae and lids unremarkable. Cardiovascular: Regular rhythm.  Lungs: Normal work of breathing. Neurologic: No focal deficits.   Lab Results  Component Value Date   CREATININE 0.64 01/25/2024   BUN 14 01/25/2024   NA 139 01/25/2024   K 4.3 01/25/2024   CL 101 01/25/2024   CO2 22 01/25/2024   Lab Results  Component Value Date   ALT 19 01/25/2024   AST 15 01/25/2024   ALKPHOS 91 01/25/2024   BILITOT 0.5 01/25/2024   Lab Results  Component Value Date   HGBA1C 5.0 01/25/2024   HGBA1C 5.2 06/07/2023   HGBA1C 5.2 02/22/2023   HGBA1C 5.2 11/16/2022   HGBA1C 5.3 05/07/2022   Lab Results  Component Value Date   INSULIN  7.5 01/25/2024   INSULIN  8.3 06/07/2023   INSULIN  16.4 11/16/2022   INSULIN  13.1 05/07/2022   INSULIN  9.2 05/20/2021   Lab Results  Component Value Date   TSH 3.06 02/21/2024   Lab Results  Component Value Date   CHOL 158 02/21/2024   HDL 69 02/21/2024   LDLCALC 73 02/21/2024   TRIG 82 02/21/2024   CHOLHDL 2.3 02/21/2024   Lab Results  Component Value Date   VD25OH 59.3 01/25/2024   VD25OH 46.5 06/07/2023   VD25OH 40.1 03/04/2023   Lab Results  Component Value Date   WBC 5.8 08/17/2023   HGB 13.2 08/17/2023   HCT 41.0 08/17/2023   MCV 88.0 08/17/2023   PLT 243 08/17/2023   Lab Results  Component Value Date   IRON 47 01/15/2017   TIBC 448 01/15/2017   FERRITIN 122 06/18/2020   Attestation Statements:   Reviewed by clinician on day of visit: allergies, medications, problem list, medical history, surgical history, family history, social history, and previous encounter notes.  I have reviewed the above documentation for accuracy and completeness, and I agree with the above. -  Iverson Sees d. Shalah Estelle, NP-C

## 2024-03-11 NOTE — Patient Instructions (Addendum)
 Increasing dose of thyroid replacement medication. Follow up with TSH only January 5th. Continue other meds as prescribed. Lipids are well controlled on current regimen. Blood pressure is under good control on current regimen.

## 2024-03-15 ENCOUNTER — Encounter: Payer: Self-pay | Admitting: Internal Medicine

## 2024-03-15 ENCOUNTER — Ambulatory Visit: Attending: Internal Medicine | Admitting: Internal Medicine

## 2024-03-15 VITALS — BP 100/62 | HR 62 | Ht 67.0 in | Wt 206.2 lb

## 2024-03-15 DIAGNOSIS — Z79899 Other long term (current) drug therapy: Secondary | ICD-10-CM | POA: Diagnosis not present

## 2024-03-15 DIAGNOSIS — I700 Atherosclerosis of aorta: Secondary | ICD-10-CM

## 2024-03-15 DIAGNOSIS — E785 Hyperlipidemia, unspecified: Secondary | ICD-10-CM

## 2024-03-15 NOTE — Patient Instructions (Signed)
 Medication Instructions:  Your physician recommends that you continue on your current medications as directed. Please refer to the Current Medication list given to you today.  *If you need a refill on your cardiac medications before your next appointment, please call your pharmacy*  Lab Work: Please complete a FASTING lipid panel in one year at any [facility].  If you have labs (blood work) drawn today and your tests are completely normal, you will receive your results only by: MyChart Message (if you have MyChart) OR A paper copy in the mail If you have any lab test that is abnormal or we need to change your treatment, we will call you to review the results.  Testing/Procedures: None.  Follow-Up: At [facility], you and your health needs are our priority.  As part of our continuing mission to provide you with exceptional heart care, our providers are all part of one team.  This team includes your primary Cardiologist (physician) and Advanced Practice Providers or APPs (Physician Assistants and Nurse Practitioners) who all work together to provide you with the care you need, when you need it.  Your next appointment:   1 year(s)  Provider:   Dr. LOIS Maxcy, MD
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
## 2024-03-30 ENCOUNTER — Ambulatory Visit (INDEPENDENT_AMBULATORY_CARE_PROVIDER_SITE_OTHER): Payer: Self-pay | Admitting: Adult Health

## 2024-03-30 VITALS — BP 113/71 | HR 65 | Temp 98.2°F | Ht 67.0 in | Wt 204.0 lb

## 2024-03-30 DIAGNOSIS — Z6833 Body mass index (BMI) 33.0-33.9, adult: Secondary | ICD-10-CM

## 2024-03-30 DIAGNOSIS — E88819 Insulin resistance, unspecified: Secondary | ICD-10-CM

## 2024-03-30 DIAGNOSIS — E669 Obesity, unspecified: Secondary | ICD-10-CM

## 2024-03-30 DIAGNOSIS — Z6832 Body mass index (BMI) 32.0-32.9, adult: Secondary | ICD-10-CM | POA: Diagnosis not present

## 2024-03-30 DIAGNOSIS — E559 Vitamin D deficiency, unspecified: Secondary | ICD-10-CM

## 2024-03-30 DIAGNOSIS — E782 Mixed hyperlipidemia: Secondary | ICD-10-CM

## 2024-03-30 MED ORDER — ERGOCALCIFEROL 1.25 MG (50000 UT) PO CAPS: 50000.0000 [IU] | ORAL_CAPSULE | ORAL | 0 refills | Status: DC

## 2024-03-30 MED ORDER — ZEPBOUND 5 MG/0.5ML ~~LOC~~ SOAJ: 5.0000 mg | SUBCUTANEOUS | 0 refills | Status: DC

## 2024-03-30 NOTE — Progress Notes (Signed)
 WEIGHT SUMMARY AND BIOMETRICS  Vitals Temp: 98.2 F (36.8 C) BP: 113/71 Pulse Rate: 65 SpO2: 98 %   Anthropometric Measurements Height: 5' 7 (1.702 m) Weight: 204 lb (92.5 kg) BMI (Calculated): 31.94 Weight at Last Visit: 201 lb Weight Lost Since Last Visit: 0 Weight Gained Since Last Visit: 3 lb Starting Weight: 224 lb Total Weight Loss (lbs): 20 lb (9.072 kg) Peak Weight: 236 lb   Body Composition  Body Fat %: 40 % Fat Mass (lbs): 82 lbs Muscle Mass (lbs): 116.6 lbs Total Body Water (lbs): 77.2 lbs Visceral Fat Rating : 10   Other Clinical Data Fasting: yes Labs: no Today's Visit #: 70 Starting Date: 06/18/20    Chief Complaint:   OBESITY Tricia Miranda is here to discuss her progress with her obesity treatment plan.  She is on the the Category 2 Plan and states she is following her eating plan approximately 85-95 % of the time.  She states she is exercising Strength Training 60 minutes 3 x week, Pilate's 60 mins 1 x week.   Interim History:  Reviewed Bioimpedance Results with pt: Muscle Mass: +2.8 lbs Adipose Mass: No Change  She started on weekly loading dose of Zepbound  2.5mg   on/about 05/28/2023 12/02/2023 Zepbound  2.5mg  increased 5mg   Patient was counseled on the importance of maintaining healthy lifestyle habits, including balanced nutrition, regular physical activity, and behavioral modifications, while taking antiobesity medication.   Patient verbalized understanding that medication is an adjunct to, not a replacement for, lifestyle changes and that the long-term success and weight maintenance depend on continued adherence to these strategies.  Denies mass in neck, dysphagia, dyspepsia, persistent hoarseness, abdominal pain, or N/V/C   Subjective:   1. Insulin  resistance  Latest Reference Range & Units 11/16/22 11:48 06/07/23 10:35 01/25/24 10:37  INSULIN  2.6 - 24.9 uIU/mL 16.4 8.3 7.5   Insulin  level steadily improving  She started on weekly  loading dose of Zepbound  2.5mg   on/about 05/28/2023 12/02/2023 Zepbound  2.5mg  increased 5mg   Patient was counseled on the importance of maintaining healthy lifestyle habits, including balanced nutrition, regular physical activity, and behavioral modifications, while taking antiobesity medication.   Patient verbalized understanding that medication is an adjunct to, not a replacement for, lifestyle changes and that the long-term success and weight maintenance depend on continued adherence to these strategies.  2. Vitamin D  deficiency  Latest Reference Range & Units 03/04/23 13:05 06/07/23 10:35 01/25/24 10:37  Vitamin D , 25-Hydroxy 30.0 - 100.0 ng/mL 40.1 46.5 59.3   Vit D Level stable and at goal  3. Mixed hyperlipidemia Lipid Panel     Component Value Date/Time   CHOL 158 02/21/2024 1004   CHOL 144 01/25/2024 1037   TRIG 82 02/21/2024 1004   HDL 69 02/21/2024 1004   HDL 62 01/25/2024 1037   CHOLHDL 2.3 02/21/2024 1004   VLDL 26 11/08/2015 1026   LDLCALC 73 02/21/2024 1004   LABVLDL 15 01/25/2024 1037  The 10-year ASCVD risk score (Arnett DK, et al., 2019) is: 1.4%   Values used to calculate the score:     Age: 58 years     Clinically relevant sex: Female     Is Non-Hispanic African American: No     Diabetic: No     Tobacco smoker: No     Systolic Blood Pressure: 113 mmHg     Is BP treated: No     HDL Cholesterol: 69 mg/dL     Total Cholesterol: 158 mg/dL   Lipid panel stable  ASCVD risk stratification acceptable  03/15/2024 Cards OV Notes Tricia Miranda is seen today in follow-up.  Overall she continues to have good control over her lipid profile.  Recent labs showed total cholesterol 144, triglycerides 75, HDL 62 and LDL 67.   Blood pressure was low normal today at 100/62.  She denies any chest pain or worsening shortness of breath.   She has recently been on Zepbound  and notes that she has lost weight and this has also helped a number of her metabolic indices.  Assessment/Plan:    1. Insulin  resistance (Primary) Refill - ZEPBOUND  5 MG/0.5ML Pen; Inject 5 mg into the skin once a week.  Dispense: 2 mL; Refill: 0  2. Vitamin D  deficiency Refill - ergocalciferol  (DRISDOL ) 1.25 MG (50000 UT) capsule; Take 1 capsule (50,000 Units total) by mouth once a week.  Dispense: 4 capsule; Refill: 0  3. Mixed hyperlipidemia Refill - ZEPBOUND  5 MG/0.5ML Pen; Inject 5 mg into the skin once a week.  Dispense: 2 mL; Refill: 0  4. BMI 33.0-33.9,adult, CURRENT BMI 32.1 Refill - ZEPBOUND  5 MG/0.5ML Pen; Inject 5 mg into the skin once a week.  Dispense: 2 mL; Refill: 0 Consider increasing dose at next OV  Tricia Miranda is currently in the action stage of change. As such, her goal is to continue with weight loss efforts. She has agreed to the Category 2 Plan.   Exercise goals: For substantial health benefits, adults should do at least 150 minutes (2 hours and 30 minutes) a week of moderate-intensity, or 75 minutes (1 hour and 15 minutes) a week of vigorous-intensity aerobic physical activity, or an equivalent combination of moderate- and vigorous-intensity aerobic activity. Aerobic activity should be performed in episodes of at least 10 minutes, and preferably, it should be spread throughout the week.  Behavioral modification strategies: increasing lean protein intake, decreasing simple carbohydrates, increasing vegetables, increasing water intake, no skipping meals, meal planning and cooking strategies, keeping healthy foods in the home, ways to avoid boredom eating, and planning for success.  Tricia Miranda has agreed to follow-up with our clinic in 4 weeks. She was informed of the importance of frequent follow-up visits to maximize her success with intensive lifestyle modifications for her multiple health conditions.   Objective:   Blood pressure 113/71, pulse 65, temperature 98.2 F (36.8 C), height 5' 7 (1.702 m), weight 204 lb (92.5 kg), last menstrual period 11/15/2018, SpO2 98%. Body mass  index is 31.95 kg/m.  General: Cooperative, alert, well developed, in no acute distress. HEENT: Conjunctivae and lids unremarkable. Cardiovascular: Regular rhythm.  Lungs: Normal work of breathing. Neurologic: No focal deficits.   Lab Results  Component Value Date   CREATININE 0.64 01/25/2024   BUN 14 01/25/2024   NA 139 01/25/2024   K 4.3 01/25/2024   CL 101 01/25/2024   CO2 22 01/25/2024   Lab Results  Component Value Date   ALT 19 01/25/2024   AST 15 01/25/2024   ALKPHOS 91 01/25/2024   BILITOT 0.5 01/25/2024   Lab Results  Component Value Date   HGBA1C 5.0 01/25/2024   HGBA1C 5.2 06/07/2023   HGBA1C 5.2 02/22/2023   HGBA1C 5.2 11/16/2022   HGBA1C 5.3 05/07/2022   Lab Results  Component Value Date   INSULIN  7.5 01/25/2024   INSULIN  8.3 06/07/2023   INSULIN  16.4 11/16/2022   INSULIN  13.1 05/07/2022   INSULIN  9.2 05/20/2021   Lab Results  Component Value Date   TSH 3.06 02/21/2024   Lab Results  Component Value Date  CHOL 158 02/21/2024   HDL 69 02/21/2024   LDLCALC 73 02/21/2024   TRIG 82 02/21/2024   CHOLHDL 2.3 02/21/2024   Lab Results  Component Value Date   VD25OH 59.3 01/25/2024   VD25OH 46.5 06/07/2023   VD25OH 40.1 03/04/2023   Lab Results  Component Value Date   WBC 5.8 08/17/2023   HGB 13.2 08/17/2023   HCT 41.0 08/17/2023   MCV 88.0 08/17/2023   PLT 243 08/17/2023   Lab Results  Component Value Date   IRON 47 01/15/2017   TIBC 448 01/15/2017   FERRITIN 122 06/18/2020   Attestation Statements:   Reviewed by clinician on day of visit: allergies, medications, problem list, medical history, surgical history, family history, social history, and previous encounter notes.  I have reviewed the above documentation for accuracy and completeness, and I agree with the above. -  Kadon Andrus d. Dannica Bickham, NP-C

## 2024-03-31 ENCOUNTER — Other Ambulatory Visit (INDEPENDENT_AMBULATORY_CARE_PROVIDER_SITE_OTHER): Payer: Self-pay | Admitting: Adult Health

## 2024-03-31 DIAGNOSIS — E559 Vitamin D deficiency, unspecified: Secondary | ICD-10-CM

## 2024-03-31 DIAGNOSIS — E782 Mixed hyperlipidemia: Secondary | ICD-10-CM

## 2024-03-31 DIAGNOSIS — Z6833 Body mass index (BMI) 33.0-33.9, adult: Secondary | ICD-10-CM

## 2024-03-31 DIAGNOSIS — E88819 Insulin resistance, unspecified: Secondary | ICD-10-CM

## 2024-04-24 ENCOUNTER — Other Ambulatory Visit

## 2024-04-26 ENCOUNTER — Other Ambulatory Visit (INDEPENDENT_AMBULATORY_CARE_PROVIDER_SITE_OTHER): Payer: Self-pay | Admitting: Adult Health

## 2024-04-26 DIAGNOSIS — Z6833 Body mass index (BMI) 33.0-33.9, adult: Secondary | ICD-10-CM

## 2024-04-26 DIAGNOSIS — E88819 Insulin resistance, unspecified: Secondary | ICD-10-CM

## 2024-04-26 DIAGNOSIS — E782 Mixed hyperlipidemia: Secondary | ICD-10-CM

## 2024-05-02 ENCOUNTER — Encounter (INDEPENDENT_AMBULATORY_CARE_PROVIDER_SITE_OTHER): Payer: Self-pay | Admitting: Family Medicine

## 2024-05-02 ENCOUNTER — Ambulatory Visit (INDEPENDENT_AMBULATORY_CARE_PROVIDER_SITE_OTHER): Admitting: Family Medicine

## 2024-05-02 ENCOUNTER — Other Ambulatory Visit (INDEPENDENT_AMBULATORY_CARE_PROVIDER_SITE_OTHER): Payer: Self-pay | Admitting: Adult Health

## 2024-05-02 VITALS — BP 109/74 | HR 61 | Temp 98.0°F | Ht 67.0 in | Wt 204.0 lb

## 2024-05-02 DIAGNOSIS — Z6831 Body mass index (BMI) 31.0-31.9, adult: Secondary | ICD-10-CM

## 2024-05-02 DIAGNOSIS — E559 Vitamin D deficiency, unspecified: Secondary | ICD-10-CM

## 2024-05-02 DIAGNOSIS — Z6833 Body mass index (BMI) 33.0-33.9, adult: Secondary | ICD-10-CM

## 2024-05-02 DIAGNOSIS — E785 Hyperlipidemia, unspecified: Secondary | ICD-10-CM | POA: Diagnosis not present

## 2024-05-02 DIAGNOSIS — E669 Obesity, unspecified: Secondary | ICD-10-CM | POA: Diagnosis not present

## 2024-05-02 DIAGNOSIS — E782 Mixed hyperlipidemia: Secondary | ICD-10-CM

## 2024-05-02 DIAGNOSIS — E88819 Insulin resistance, unspecified: Secondary | ICD-10-CM

## 2024-05-02 MED ORDER — ERGOCALCIFEROL 1.25 MG (50000 UT) PO CAPS: 50000.0000 [IU] | ORAL_CAPSULE | ORAL | 0 refills | Status: AC

## 2024-05-02 MED ORDER — ZEPBOUND 5 MG/0.5ML ~~LOC~~ SOAJ: 5.0000 mg | SUBCUTANEOUS | 0 refills | Status: AC

## 2024-05-02 NOTE — Progress Notes (Signed)
 "  Office: [phone]  /  Fax: [phone]  WEIGHT SUMMARY AND BIOMETRICS  Anthropometric Measurements Height: 5' 7 (1.702 m) Weight: 204 lb (92.5 kg) BMI (Calculated): 31.94 Weight at Last Visit: 204 lb Weight Lost Since Last Visit: 0 Weight Gained Since Last Visit: 0 Starting Weight: 224 lb Total Weight Loss (lbs): 20 lb (9.072 kg) Peak Weight: 236 lb   Body Composition  Body Fat %: 40.3 % Fat Mass (lbs): 82.2 lbs Muscle Mass (lbs): 115.8 lbs Total Body Water (lbs): 76 lbs Visceral Fat Rating : 10   Other Clinical Data Fasting: yes Labs: no Today's Visit #: 50 Starting Date: 06/18/20    Chief Complaint: OBESITY    History of Present Illness Berlynn Warsame is a 59 year old female with obesity and insulin  resistance who presents for obesity treatment and progress assessment.  She has been following a category two eating plan approximately seventy percent of the time and exercises two to three days a week for about fifty minutes, engaging in various activities. She reports her weight has not dropped over the last month, which she attributes to holiday travel and frequent dining out, leading to increased temptations. Her hunger levels have been stable, and she does not feel she has been overeating.  She is currently on Zepbound , five milligrams, for insulin  resistance and reports no issues with the medication. She is concerned about potential changes in her insurance copayments. She has been on this dose for a short period and feels it has helped her manage through the holidays.  In addition to obesity and insulin  resistance, she is being treated for vitamin D  deficiency with prescription ergocalciferol , fifty thousand international units per week, and requests a refill.  During the review of symptoms, she mentions possible water retention but no significant issues with sodium intake, as she does not add salt to her food at home. She consumes frozen meals at  lunchtime and occasionally eats out, which may contribute to higher sodium intake.      PHYSICAL EXAM:  Blood pressure 109/74, pulse 61, temperature 98 F (36.7 C), height 5' 7 (1.702 m), weight 204 lb (92.5 kg), last menstrual period 11/15/2018, SpO2 98%. Body mass index is 31.95 kg/m.  DIAGNOSTIC DATA REVIEWED BY MYSELF TODAY:  BMET    Component Value Date/Time   NA 139 01/25/2024 1037   K 4.3 01/25/2024 1037   CL 101 01/25/2024 1037   CO2 22 01/25/2024 1037   GLUCOSE 80 01/25/2024 1037   GLUCOSE 79 08/17/2023 1011   BUN 14 01/25/2024 1037   CREATININE 0.64 01/25/2024 1037   CREATININE 0.56 08/17/2023 1011   CALCIUM  9.3 01/25/2024 1037   GFRNONAA >60 07/08/2022 1009   GFRNONAA 108 05/28/2020 1145   GFRAA 126 05/28/2020 1145   Lab Results  Component Value Date   HGBA1C 5.0 01/25/2024   HGBA1C 5.3 10/07/2010   Lab Results  Component Value Date   INSULIN  7.5 01/25/2024   INSULIN  6.9 06/18/2020   Lab Results  Component Value Date   TSH 3.06 02/21/2024   CBC    Component Value Date/Time   WBC 5.8 08/17/2023 1011   RBC 4.66 08/17/2023 1011   HGB 13.2 08/17/2023 1011   HCT 41.0 08/17/2023 1011   PLT 243 08/17/2023 1011   MCV 88.0 08/17/2023 1011   MCH 28.3 08/17/2023 1011   MCHC 32.2 08/17/2023 1011   RDW 12.1 08/17/2023 1011   Iron Studies    Component Value Date/Time   IRON  47 01/15/2017 0906   TIBC 448 01/15/2017 0906   FERRITIN 122 06/18/2020 1059   IRONPCTSAT 10 (L) 01/15/2017 0906   Lipid Panel     Component Value Date/Time   CHOL 158 02/21/2024 1004   CHOL 144 01/25/2024 1037   TRIG 82 02/21/2024 1004   HDL 69 02/21/2024 1004   HDL 62 01/25/2024 1037   CHOLHDL 2.3 02/21/2024 1004   VLDL 26 11/08/2015 1026   LDLCALC 73 02/21/2024 1004   Hepatic Function Panel     Component Value Date/Time   PROT 6.6 01/25/2024 1037   ALBUMIN 4.3 01/25/2024 1037   AST 15 01/25/2024 1037   ALT 19 01/25/2024 1037   ALKPHOS 91 01/25/2024 1037    BILITOT 0.5 01/25/2024 1037   BILIDIR 0.1 11/28/2019 1217   IBILI 0.5 11/28/2019 1217      Component Value Date/Time   TSH 3.06 02/21/2024 1004   Nutritional Lab Results  Component Value Date   VD25OH 59.3 01/25/2024   VD25OH 46.5 06/07/2023   VD25OH 40.1 03/04/2023     Assessment and Plan Assessment & Plan Obesity and HLD Management is ongoing with a category two eating plan, followed approximately 70% of the time. Exercise is performed 2-3 days a week for 50 minutes. Weight gain occurred over the last month, likely due to holiday travel and dietary challenges. Muscle mass has decreased, possibly due to weight loss and postmenopausal changes. Hunger levels are well-controlled with Zepbound , and there are no issues with the medication. Concerns about potential insurance changes affecting copayments for Zepbound  were discussed. Oral Georjean was considered but not preferred due to higher side effects and administration complexity. Zepbound  is preferred over oral Wegovy due to fewer side effects and ease of administration. - Continue Zepbound  5 mg. - Monitor weight and muscle mass trends. - Scheduled follow-up in 4 weeks.  Insulin  resistance Managed with Zepbound . No issues reported with the medication. Discussion about the impact of dietary choices on insulin  resistance and hunger control. Emphasis on the importance of reducing carbohydrate intake to enhance the effectiveness of Zepbound . - Continue Zepbound  5 mg. - Monitor dietary intake, focusing on reducing carbohydrate consumption.  Vitamin D  deficiency Managed with prescription ergocalciferol  50,000 IU weekly. Request for a refill was made. - Refilled ergocalciferol  50,000 IU weekly.      Patients who are on anti-obesity medications are counseled on the importance of maintaining healthy lifestyle habits, including balanced nutrition, regular physical activity, and behavioral modifications,  Medication is an adjunct to, not a  replacement for, lifestyle changes and that the long-term success and weight maintenance depend on continued adherence to these strategies.   Freddye was informed of the importance of frequent follow up visits to maximize her success with intensive lifestyle modifications for her obesity and obesity related health conditions as recommended by USPSTF and CMS guidelines  Louann Penton, MD   "

## 2024-05-08 ENCOUNTER — Telehealth (INDEPENDENT_AMBULATORY_CARE_PROVIDER_SITE_OTHER): Payer: Self-pay | Admitting: Family Medicine

## 2024-05-08 NOTE — Telephone Encounter (Signed)
 Prior authorization has been submitted for Zepbound  for Obesity.  Awaiting determination.    [facility] is reviewing your PA request. Typically an electronic response will be received within 24-72 hours. To check for an update later, open this request from your dashboard.  You may close this dialog and return to your dashboard to perform other tasks.

## 2024-05-08 NOTE — Telephone Encounter (Signed)
 Pt called and was seen last week and zepbound  rx was sent in. Her insurance did change and now its [facility] and they said they sent over a request for prior auth. Pt stated that since changed insurance they are still communicating and if she still has approval from the old insurance they may accept it as of now but that will be changing in the future.

## 2024-05-08 NOTE — Telephone Encounter (Signed)
 Message from Plan Request Reference Number: [id number]. ZEPBOUND  INJ 5/0.5ML is approved through 11/05/2024. Your patient may now fill this prescription and it will be covered.. Authorization Expiration Date: November 05, 2024  Patient notified via my chart.

## 2024-05-15 ENCOUNTER — Other Ambulatory Visit

## 2024-05-19 ENCOUNTER — Other Ambulatory Visit

## 2024-05-19 DIAGNOSIS — E039 Hypothyroidism, unspecified: Secondary | ICD-10-CM

## 2024-05-20 ENCOUNTER — Ambulatory Visit: Payer: Self-pay | Admitting: Internal Medicine

## 2024-05-20 LAB — TSH: TSH: 2.26 m[IU]/L (ref 0.40–4.50)

## 2024-05-26 ENCOUNTER — Other Ambulatory Visit (INDEPENDENT_AMBULATORY_CARE_PROVIDER_SITE_OTHER): Payer: Self-pay | Admitting: Family Medicine

## 2024-05-26 DIAGNOSIS — E559 Vitamin D deficiency, unspecified: Secondary | ICD-10-CM

## 2024-05-30 ENCOUNTER — Ambulatory Visit (INDEPENDENT_AMBULATORY_CARE_PROVIDER_SITE_OTHER): Admitting: Adult Health

## 2024-06-28 ENCOUNTER — Ambulatory Visit (INDEPENDENT_AMBULATORY_CARE_PROVIDER_SITE_OTHER): Admitting: Family Medicine

## 2024-08-14 ENCOUNTER — Ambulatory Visit (HOSPITAL_BASED_OUTPATIENT_CLINIC_OR_DEPARTMENT_OTHER)

## 2024-08-21 ENCOUNTER — Other Ambulatory Visit

## 2024-08-25 ENCOUNTER — Encounter: Admitting: Internal Medicine

## 2025-01-24 ENCOUNTER — Ambulatory Visit: Admitting: Obstetrics and Gynecology
